# Patient Record
Sex: Female | Born: 1940 | Race: White | Hispanic: No | Marital: Single | Smoking: Never smoker
Health system: Southern US, Community
[De-identification: ages and names within clinical notes are randomized; demographics above are authoritative.]

## PROBLEM LIST (undated history)

## (undated) DIAGNOSIS — M26609 Unspecified temporomandibular joint disorder, unspecified side: Secondary | ICD-10-CM

## (undated) DIAGNOSIS — F32A Depression, unspecified: Secondary | ICD-10-CM

## (undated) DIAGNOSIS — Z853 Personal history of malignant neoplasm of breast: Secondary | ICD-10-CM

## (undated) DIAGNOSIS — F329 Major depressive disorder, single episode, unspecified: Secondary | ICD-10-CM

## (undated) DIAGNOSIS — C50919 Malignant neoplasm of unspecified site of unspecified female breast: Secondary | ICD-10-CM

## (undated) DIAGNOSIS — F419 Anxiety disorder, unspecified: Secondary | ICD-10-CM

## (undated) DIAGNOSIS — I10 Essential (primary) hypertension: Secondary | ICD-10-CM

## (undated) DIAGNOSIS — E785 Hyperlipidemia, unspecified: Secondary | ICD-10-CM

## (undated) DIAGNOSIS — K449 Diaphragmatic hernia without obstruction or gangrene: Secondary | ICD-10-CM

## (undated) DIAGNOSIS — K589 Irritable bowel syndrome without diarrhea: Secondary | ICD-10-CM

## (undated) DIAGNOSIS — E041 Nontoxic single thyroid nodule: Secondary | ICD-10-CM

## (undated) DIAGNOSIS — K219 Gastro-esophageal reflux disease without esophagitis: Secondary | ICD-10-CM

## (undated) DIAGNOSIS — M1990 Unspecified osteoarthritis, unspecified site: Secondary | ICD-10-CM

## (undated) DIAGNOSIS — Z9289 Personal history of other medical treatment: Secondary | ICD-10-CM

## (undated) HISTORY — DX: Diaphragmatic hernia without obstruction or gangrene: K44.9

## (undated) HISTORY — PX: EYE SURGERY: SHX253

## (undated) HISTORY — DX: Essential (primary) hypertension: I10

## (undated) HISTORY — DX: Depression, unspecified: F32.A

## (undated) HISTORY — PX: HERNIA REPAIR: SHX51

## (undated) HISTORY — DX: Personal history of malignant neoplasm of breast: Z85.3

## (undated) HISTORY — DX: Gastro-esophageal reflux disease without esophagitis: K21.9

## (undated) HISTORY — PX: APPENDECTOMY: SHX54

## (undated) HISTORY — DX: Unspecified temporomandibular joint disorder, unspecified side: M26.609

## (undated) HISTORY — PX: BREAST LUMPECTOMY: SHX2

## (undated) HISTORY — DX: Unspecified osteoarthritis, unspecified site: M19.90

## (undated) HISTORY — PX: THYROID SURGERY: SHX805

## (undated) HISTORY — DX: Nontoxic single thyroid nodule: E04.1

## (undated) HISTORY — DX: Hyperlipidemia, unspecified: E78.5

## (undated) HISTORY — DX: Malignant neoplasm of unspecified site of unspecified female breast: C50.919

## (undated) HISTORY — DX: Major depressive disorder, single episode, unspecified: F32.9

## (undated) HISTORY — DX: Irritable bowel syndrome, unspecified: K58.9

## (undated) HISTORY — PX: ABDOMINAL HYSTERECTOMY: SHX81

---

## 2005-11-12 ENCOUNTER — Ambulatory Visit: Payer: Self-pay | Admitting: Family Medicine

## 2005-11-19 ENCOUNTER — Ambulatory Visit: Payer: Self-pay | Admitting: Oncology

## 2005-11-26 ENCOUNTER — Encounter: Admission: RE | Admit: 2005-11-26 | Discharge: 2005-11-26 | Payer: Self-pay | Admitting: Family Medicine

## 2005-12-24 LAB — COMPREHENSIVE METABOLIC PANEL
ALT: 8 U/L (ref 0–40)
CO2: 28 mEq/L (ref 19–32)
Chloride: 98 mEq/L (ref 96–112)
Sodium: 137 mEq/L (ref 135–145)
Total Bilirubin: 1.1 mg/dL (ref 0.3–1.2)
Total Protein: 6.6 g/dL (ref 6.0–8.3)

## 2005-12-24 LAB — URINALYSIS, MICROSCOPIC - CHCC
Bilirubin (Urine): NEGATIVE
Ketones: NEGATIVE mg/dL

## 2005-12-24 LAB — CBC WITH DIFFERENTIAL/PLATELET
BASO%: 0.3 % (ref 0.0–2.0)
LYMPH%: 11.8 % — ABNORMAL LOW (ref 14.0–48.0)
MCHC: 34.1 g/dL (ref 32.0–36.0)
MONO#: 0.9 10*3/uL (ref 0.1–0.9)
RBC: 4.69 10*6/uL (ref 3.70–5.32)
WBC: 9.4 10*3/uL (ref 3.9–10.0)
lymph#: 1.1 10*3/uL (ref 0.9–3.3)

## 2006-01-02 ENCOUNTER — Ambulatory Visit: Payer: Self-pay | Admitting: Family Medicine

## 2006-03-05 ENCOUNTER — Encounter: Admission: RE | Admit: 2006-03-05 | Discharge: 2006-03-05 | Payer: Self-pay | Admitting: Oncology

## 2006-03-14 ENCOUNTER — Ambulatory Visit: Payer: Self-pay | Admitting: Oncology

## 2006-03-18 LAB — CBC WITH DIFFERENTIAL/PLATELET
Basophils Absolute: 0 10*3/uL (ref 0.0–0.1)
EOS%: 4.5 % (ref 0.0–7.0)
HCT: 41.2 % (ref 34.8–46.6)
HGB: 14.3 g/dL (ref 11.6–15.9)
MCH: 30.5 pg (ref 26.0–34.0)
MCV: 88.1 fL (ref 81.0–101.0)
MONO%: 8.2 % (ref 0.0–13.0)
NEUT%: 57.4 % (ref 39.6–76.8)

## 2006-03-18 LAB — COMPREHENSIVE METABOLIC PANEL
Albumin: 4.4 g/dL (ref 3.5–5.2)
Alkaline Phosphatase: 66 U/L (ref 39–117)
BUN: 15 mg/dL (ref 6–23)
Calcium: 10 mg/dL (ref 8.4–10.5)
Chloride: 104 mEq/L (ref 96–112)
Glucose, Bld: 127 mg/dL — ABNORMAL HIGH (ref 70–99)
Potassium: 4 mEq/L (ref 3.5–5.3)

## 2006-04-15 ENCOUNTER — Ambulatory Visit: Payer: Self-pay | Admitting: Family Medicine

## 2006-04-18 ENCOUNTER — Ambulatory Visit: Payer: Self-pay | Admitting: Family Medicine

## 2006-04-18 LAB — CONVERTED CEMR LAB
BUN: 14 mg/dL (ref 6–23)
CO2: 27 meq/L (ref 19–32)
Calcium: 9.8 mg/dL (ref 8.4–10.5)
Chol/HDL Ratio, serum: 5.7
Creatinine, Ser: 0.9 mg/dL (ref 0.4–1.2)
Eosinophil percent: 6.7 % — ABNORMAL HIGH (ref 0.0–5.0)
Glomerular Filtration Rate, Af Am: 81 mL/min/{1.73_m2}
Glucose, Bld: 107 mg/dL — ABNORMAL HIGH (ref 70–99)
HCT: 44.1 % (ref 36.0–46.0)
Hemoglobin: 14.8 g/dL (ref 12.0–15.0)
LDL Cholesterol: 172 mg/dL — ABNORMAL HIGH (ref 0–99)
Lymphocytes Relative: 31.1 % (ref 12.0–46.0)
MCV: 90.9 fL (ref 78.0–100.0)
Neutro Abs: 3.1 10*3/uL (ref 1.4–7.7)
Sodium: 141 meq/L (ref 135–145)
Total Bilirubin: 1 mg/dL (ref 0.3–1.2)
Total Protein: 6.7 g/dL (ref 6.0–8.3)
WBC: 5.9 10*3/uL (ref 4.5–10.5)

## 2006-08-02 ENCOUNTER — Ambulatory Visit: Payer: Self-pay | Admitting: Family Medicine

## 2006-09-09 ENCOUNTER — Ambulatory Visit: Payer: Self-pay | Admitting: Family Medicine

## 2006-09-18 DIAGNOSIS — E041 Nontoxic single thyroid nodule: Secondary | ICD-10-CM | POA: Insufficient documentation

## 2006-09-18 DIAGNOSIS — K589 Irritable bowel syndrome without diarrhea: Secondary | ICD-10-CM | POA: Insufficient documentation

## 2006-09-18 DIAGNOSIS — I10 Essential (primary) hypertension: Secondary | ICD-10-CM | POA: Insufficient documentation

## 2006-09-18 DIAGNOSIS — M129 Arthropathy, unspecified: Secondary | ICD-10-CM | POA: Insufficient documentation

## 2006-09-19 ENCOUNTER — Encounter: Payer: Self-pay | Admitting: Family Medicine

## 2006-09-19 ENCOUNTER — Ambulatory Visit: Payer: Self-pay | Admitting: Family Medicine

## 2006-09-19 DIAGNOSIS — M26629 Arthralgia of temporomandibular joint, unspecified side: Secondary | ICD-10-CM | POA: Insufficient documentation

## 2006-09-19 DIAGNOSIS — J019 Acute sinusitis, unspecified: Secondary | ICD-10-CM | POA: Insufficient documentation

## 2006-09-19 DIAGNOSIS — F320 Major depressive disorder, single episode, mild: Secondary | ICD-10-CM | POA: Insufficient documentation

## 2006-09-19 DIAGNOSIS — M159 Polyosteoarthritis, unspecified: Secondary | ICD-10-CM | POA: Insufficient documentation

## 2006-09-19 DIAGNOSIS — Z853 Personal history of malignant neoplasm of breast: Secondary | ICD-10-CM | POA: Insufficient documentation

## 2006-10-01 ENCOUNTER — Ambulatory Visit: Payer: Self-pay | Admitting: Oncology

## 2006-10-03 LAB — CANCER ANTIGEN 27.29: CA 27.29: 19 U/mL (ref 0–39)

## 2006-10-03 LAB — COMPREHENSIVE METABOLIC PANEL
ALT: 11 U/L (ref 0–35)
AST: 15 U/L (ref 0–37)
Alkaline Phosphatase: 69 U/L (ref 39–117)
Creatinine, Ser: 0.71 mg/dL (ref 0.40–1.20)
Sodium: 140 mEq/L (ref 135–145)
Total Bilirubin: 0.7 mg/dL (ref 0.3–1.2)
Total Protein: 6.9 g/dL (ref 6.0–8.3)

## 2006-10-03 LAB — CBC WITH DIFFERENTIAL/PLATELET
BASO%: 1.1 % (ref 0.0–2.0)
EOS%: 2.8 % (ref 0.0–7.0)
HCT: 41.7 % (ref 34.8–46.6)
LYMPH%: 23 % (ref 14.0–48.0)
MCH: 31.3 pg (ref 26.0–34.0)
MCHC: 35.3 g/dL (ref 32.0–36.0)
MONO#: 0.4 10*3/uL (ref 0.1–0.9)
MONO%: 7 % (ref 0.0–13.0)
NEUT%: 66.1 % (ref 39.6–76.8)
Platelets: 329 10*3/uL (ref 145–400)
RBC: 4.7 10*6/uL (ref 3.70–5.32)
WBC: 6.2 10*3/uL (ref 3.9–10.0)

## 2006-10-10 ENCOUNTER — Encounter: Payer: Self-pay | Admitting: Family Medicine

## 2006-10-31 ENCOUNTER — Telehealth (INDEPENDENT_AMBULATORY_CARE_PROVIDER_SITE_OTHER): Payer: Self-pay | Admitting: *Deleted

## 2006-11-19 ENCOUNTER — Ambulatory Visit: Payer: Self-pay | Admitting: Family Medicine

## 2006-11-19 DIAGNOSIS — N390 Urinary tract infection, site not specified: Secondary | ICD-10-CM | POA: Insufficient documentation

## 2006-11-19 LAB — CONVERTED CEMR LAB
Glucose, Urine, Semiquant: NEGATIVE
Nitrite: NEGATIVE
Urobilinogen, UA: NEGATIVE
pH: 6

## 2006-11-21 ENCOUNTER — Encounter: Payer: Self-pay | Admitting: Family Medicine

## 2006-11-21 ENCOUNTER — Telehealth (INDEPENDENT_AMBULATORY_CARE_PROVIDER_SITE_OTHER): Payer: Self-pay | Admitting: *Deleted

## 2006-11-29 ENCOUNTER — Encounter: Admission: RE | Admit: 2006-11-29 | Discharge: 2006-11-29 | Payer: Self-pay | Admitting: Oncology

## 2006-12-13 ENCOUNTER — Telehealth: Payer: Self-pay | Admitting: Family Medicine

## 2006-12-24 ENCOUNTER — Encounter: Admission: RE | Admit: 2006-12-24 | Discharge: 2006-12-24 | Payer: Self-pay | Admitting: Oncology

## 2006-12-31 ENCOUNTER — Ambulatory Visit: Payer: Self-pay | Admitting: Family Medicine

## 2006-12-31 DIAGNOSIS — M26609 Unspecified temporomandibular joint disorder, unspecified side: Secondary | ICD-10-CM | POA: Insufficient documentation

## 2006-12-31 DIAGNOSIS — R300 Dysuria: Secondary | ICD-10-CM | POA: Insufficient documentation

## 2006-12-31 LAB — CONVERTED CEMR LAB
Bilirubin Urine: NEGATIVE
Glucose, Urine, Semiquant: NEGATIVE
Urobilinogen, UA: NEGATIVE

## 2007-01-01 ENCOUNTER — Encounter: Payer: Self-pay | Admitting: Family Medicine

## 2007-01-16 ENCOUNTER — Telehealth (INDEPENDENT_AMBULATORY_CARE_PROVIDER_SITE_OTHER): Payer: Self-pay | Admitting: *Deleted

## 2007-01-21 ENCOUNTER — Ambulatory Visit: Payer: Self-pay | Admitting: Family Medicine

## 2007-01-23 ENCOUNTER — Telehealth: Payer: Self-pay | Admitting: Family Medicine

## 2007-01-23 ENCOUNTER — Encounter: Payer: Self-pay | Admitting: Family Medicine

## 2007-01-27 ENCOUNTER — Telehealth (INDEPENDENT_AMBULATORY_CARE_PROVIDER_SITE_OTHER): Payer: Self-pay | Admitting: *Deleted

## 2007-02-03 ENCOUNTER — Telehealth (INDEPENDENT_AMBULATORY_CARE_PROVIDER_SITE_OTHER): Payer: Self-pay | Admitting: *Deleted

## 2007-02-06 ENCOUNTER — Ambulatory Visit: Payer: Self-pay | Admitting: Family Medicine

## 2007-02-18 ENCOUNTER — Encounter (INDEPENDENT_AMBULATORY_CARE_PROVIDER_SITE_OTHER): Payer: Self-pay | Admitting: *Deleted

## 2007-02-20 ENCOUNTER — Ambulatory Visit: Payer: Self-pay | Admitting: Family Medicine

## 2007-02-20 DIAGNOSIS — J309 Allergic rhinitis, unspecified: Secondary | ICD-10-CM | POA: Insufficient documentation

## 2007-03-05 ENCOUNTER — Telehealth (INDEPENDENT_AMBULATORY_CARE_PROVIDER_SITE_OTHER): Payer: Self-pay | Admitting: *Deleted

## 2007-03-21 ENCOUNTER — Emergency Department (HOSPITAL_COMMUNITY): Admission: EM | Admit: 2007-03-21 | Discharge: 2007-03-22 | Payer: Self-pay | Admitting: Emergency Medicine

## 2007-03-27 ENCOUNTER — Ambulatory Visit (HOSPITAL_COMMUNITY): Admission: RE | Admit: 2007-03-27 | Discharge: 2007-03-27 | Payer: Self-pay | Admitting: Oncology

## 2007-03-27 ENCOUNTER — Ambulatory Visit: Payer: Self-pay | Admitting: Oncology

## 2007-03-27 LAB — BASIC METABOLIC PANEL
BUN: 20 mg/dL (ref 6–23)
CO2: 27 mEq/L (ref 19–32)
Calcium: 9.8 mg/dL (ref 8.4–10.5)
Chloride: 107 mEq/L (ref 96–112)
Creatinine, Ser: 0.75 mg/dL (ref 0.40–1.20)
Glucose, Bld: 123 mg/dL — ABNORMAL HIGH (ref 70–99)

## 2007-03-31 ENCOUNTER — Ambulatory Visit (HOSPITAL_COMMUNITY): Admission: RE | Admit: 2007-03-31 | Discharge: 2007-03-31 | Payer: Self-pay | Admitting: Obstetrics and Gynecology

## 2007-03-31 ENCOUNTER — Encounter (INDEPENDENT_AMBULATORY_CARE_PROVIDER_SITE_OTHER): Payer: Self-pay | Admitting: Obstetrics and Gynecology

## 2007-04-10 ENCOUNTER — Encounter: Payer: Self-pay | Admitting: Family Medicine

## 2007-04-17 ENCOUNTER — Telehealth (INDEPENDENT_AMBULATORY_CARE_PROVIDER_SITE_OTHER): Payer: Self-pay | Admitting: *Deleted

## 2007-04-22 ENCOUNTER — Inpatient Hospital Stay (HOSPITAL_COMMUNITY): Admission: RE | Admit: 2007-04-22 | Discharge: 2007-04-23 | Payer: Self-pay | Admitting: Obstetrics and Gynecology

## 2007-04-22 ENCOUNTER — Encounter (INDEPENDENT_AMBULATORY_CARE_PROVIDER_SITE_OTHER): Payer: Self-pay | Admitting: Obstetrics and Gynecology

## 2007-05-12 ENCOUNTER — Ambulatory Visit: Payer: Self-pay | Admitting: Oncology

## 2007-05-14 ENCOUNTER — Encounter: Payer: Self-pay | Admitting: Family Medicine

## 2007-05-14 LAB — CBC WITH DIFFERENTIAL/PLATELET
Basophils Absolute: 0 10*3/uL (ref 0.0–0.1)
Eosinophils Absolute: 0.2 10*3/uL (ref 0.0–0.5)
HGB: 14.6 g/dL (ref 11.6–15.9)
MCV: 88.3 fL (ref 81.0–101.0)
MONO%: 5.5 % (ref 0.0–13.0)
NEUT#: 3.5 10*3/uL (ref 1.5–6.5)
RDW: 12.9 % (ref 11.3–14.5)

## 2007-05-15 LAB — TSH: TSH: 1.464 u[IU]/mL (ref 0.350–5.500)

## 2007-05-15 LAB — COMPREHENSIVE METABOLIC PANEL
Albumin: 4.5 g/dL (ref 3.5–5.2)
Alkaline Phosphatase: 71 U/L (ref 39–117)
BUN: 17 mg/dL (ref 6–23)
CO2: 24 mEq/L (ref 19–32)
Calcium: 9.9 mg/dL (ref 8.4–10.5)
Chloride: 105 mEq/L (ref 96–112)
Glucose, Bld: 139 mg/dL — ABNORMAL HIGH (ref 70–99)
Potassium: 4.2 mEq/L (ref 3.5–5.3)

## 2007-05-15 LAB — CANCER ANTIGEN 27.29: CA 27.29: 21 U/mL (ref 0–39)

## 2007-05-15 LAB — CA 125: CA 125: 22.2 U/mL (ref 0.0–30.2)

## 2007-06-09 ENCOUNTER — Encounter: Admission: RE | Admit: 2007-06-09 | Discharge: 2007-06-09 | Payer: Self-pay | Admitting: Obstetrics and Gynecology

## 2007-06-10 ENCOUNTER — Ambulatory Visit: Payer: Self-pay | Admitting: Internal Medicine

## 2007-06-10 DIAGNOSIS — M109 Gout, unspecified: Secondary | ICD-10-CM | POA: Insufficient documentation

## 2007-06-10 LAB — CONVERTED CEMR LAB
Glucose, Urine, Semiquant: NEGATIVE
Ketones, urine, test strip: NEGATIVE
Nitrite: POSITIVE
Specific Gravity, Urine: >=1.03

## 2007-06-11 ENCOUNTER — Encounter: Payer: Self-pay | Admitting: Internal Medicine

## 2007-06-12 ENCOUNTER — Telehealth (INDEPENDENT_AMBULATORY_CARE_PROVIDER_SITE_OTHER): Payer: Self-pay | Admitting: *Deleted

## 2007-06-26 ENCOUNTER — Ambulatory Visit: Payer: Self-pay | Admitting: Family Medicine

## 2007-06-26 DIAGNOSIS — L820 Inflamed seborrheic keratosis: Secondary | ICD-10-CM | POA: Insufficient documentation

## 2007-06-26 DIAGNOSIS — IMO0002 Reserved for concepts with insufficient information to code with codable children: Secondary | ICD-10-CM | POA: Insufficient documentation

## 2007-07-07 ENCOUNTER — Telehealth (INDEPENDENT_AMBULATORY_CARE_PROVIDER_SITE_OTHER): Payer: Self-pay | Admitting: *Deleted

## 2007-07-16 ENCOUNTER — Telehealth (INDEPENDENT_AMBULATORY_CARE_PROVIDER_SITE_OTHER): Payer: Self-pay | Admitting: *Deleted

## 2007-08-07 ENCOUNTER — Telehealth (INDEPENDENT_AMBULATORY_CARE_PROVIDER_SITE_OTHER): Payer: Self-pay | Admitting: *Deleted

## 2007-08-13 ENCOUNTER — Ambulatory Visit: Payer: Self-pay | Admitting: Internal Medicine

## 2007-08-13 DIAGNOSIS — R350 Frequency of micturition: Secondary | ICD-10-CM | POA: Insufficient documentation

## 2007-08-13 LAB — CONVERTED CEMR LAB
Bilirubin Urine: NEGATIVE
Glucose, Urine, Semiquant: NEGATIVE
Urobilinogen, UA: NEGATIVE

## 2007-08-15 ENCOUNTER — Encounter: Payer: Self-pay | Admitting: Internal Medicine

## 2007-09-02 ENCOUNTER — Telehealth (INDEPENDENT_AMBULATORY_CARE_PROVIDER_SITE_OTHER): Payer: Self-pay | Admitting: *Deleted

## 2007-09-30 ENCOUNTER — Ambulatory Visit: Payer: Self-pay | Admitting: Oncology

## 2007-10-09 ENCOUNTER — Ambulatory Visit (HOSPITAL_COMMUNITY): Admission: RE | Admit: 2007-10-09 | Discharge: 2007-10-09 | Payer: Self-pay | Admitting: Oncology

## 2007-10-09 LAB — CBC WITH DIFFERENTIAL/PLATELET
BASO%: 0.6 % (ref 0.0–2.0)
LYMPH%: 24.2 % (ref 14.0–48.0)
MCHC: 34.4 g/dL (ref 32.0–36.0)
MONO#: 0.5 10*3/uL (ref 0.1–0.9)
MONO%: 8.1 % (ref 0.0–13.0)
Platelets: 314 10*3/uL (ref 145–400)
RBC: 4.83 10*6/uL (ref 3.70–5.32)
WBC: 6.7 10*3/uL (ref 3.9–10.0)

## 2007-10-09 LAB — COMPREHENSIVE METABOLIC PANEL
ALT: 16 U/L (ref 0–35)
AST: 20 U/L (ref 0–37)
Alkaline Phosphatase: 61 U/L (ref 39–117)
CO2: 28 mEq/L (ref 19–32)
Sodium: 138 mEq/L (ref 135–145)
Total Bilirubin: 0.9 mg/dL (ref 0.3–1.2)
Total Protein: 6.5 g/dL (ref 6.0–8.3)

## 2007-10-14 ENCOUNTER — Telehealth: Payer: Self-pay | Admitting: Family Medicine

## 2007-10-15 ENCOUNTER — Telehealth (INDEPENDENT_AMBULATORY_CARE_PROVIDER_SITE_OTHER): Payer: Self-pay | Admitting: *Deleted

## 2007-10-21 ENCOUNTER — Telehealth (INDEPENDENT_AMBULATORY_CARE_PROVIDER_SITE_OTHER): Payer: Self-pay | Admitting: *Deleted

## 2007-11-03 ENCOUNTER — Encounter: Payer: Self-pay | Admitting: Family Medicine

## 2007-11-06 ENCOUNTER — Telehealth (INDEPENDENT_AMBULATORY_CARE_PROVIDER_SITE_OTHER): Payer: Self-pay | Admitting: *Deleted

## 2007-11-10 ENCOUNTER — Ambulatory Visit: Payer: Self-pay | Admitting: Internal Medicine

## 2007-11-10 LAB — CONVERTED CEMR LAB
Ketones, urine, test strip: NEGATIVE
Nitrite: POSITIVE
Specific Gravity, Urine: 1.015
pH: 7

## 2007-11-11 ENCOUNTER — Encounter: Payer: Self-pay | Admitting: Internal Medicine

## 2007-11-14 ENCOUNTER — Telehealth (INDEPENDENT_AMBULATORY_CARE_PROVIDER_SITE_OTHER): Payer: Self-pay | Admitting: *Deleted

## 2007-11-19 ENCOUNTER — Telehealth (INDEPENDENT_AMBULATORY_CARE_PROVIDER_SITE_OTHER): Payer: Self-pay | Admitting: *Deleted

## 2007-11-28 ENCOUNTER — Ambulatory Visit: Payer: Self-pay | Admitting: Family Medicine

## 2007-11-28 DIAGNOSIS — M549 Dorsalgia, unspecified: Secondary | ICD-10-CM | POA: Insufficient documentation

## 2007-11-28 DIAGNOSIS — R5381 Other malaise: Secondary | ICD-10-CM | POA: Insufficient documentation

## 2007-11-28 DIAGNOSIS — R5383 Other fatigue: Secondary | ICD-10-CM

## 2007-11-28 LAB — CONVERTED CEMR LAB
Blood in Urine, dipstick: NEGATIVE
Glucose, Urine, Semiquant: NEGATIVE
Ketones, urine, test strip: NEGATIVE
Nitrite: NEGATIVE

## 2007-12-01 ENCOUNTER — Ambulatory Visit: Payer: Self-pay | Admitting: Family Medicine

## 2007-12-01 ENCOUNTER — Encounter: Admission: RE | Admit: 2007-12-01 | Discharge: 2007-12-01 | Payer: Self-pay | Admitting: Oncology

## 2007-12-01 ENCOUNTER — Encounter (INDEPENDENT_AMBULATORY_CARE_PROVIDER_SITE_OTHER): Payer: Self-pay | Admitting: *Deleted

## 2007-12-02 ENCOUNTER — Encounter (INDEPENDENT_AMBULATORY_CARE_PROVIDER_SITE_OTHER): Payer: Self-pay | Admitting: *Deleted

## 2007-12-15 ENCOUNTER — Encounter: Admission: RE | Admit: 2007-12-15 | Discharge: 2007-12-30 | Payer: Self-pay | Admitting: Family Medicine

## 2007-12-16 ENCOUNTER — Ambulatory Visit: Payer: Self-pay | Admitting: Family Medicine

## 2007-12-16 LAB — CONVERTED CEMR LAB
Bilirubin Urine: NEGATIVE
Ketones, urine, test strip: NEGATIVE
Nitrite: POSITIVE
Protein, U semiquant: 30
Specific Gravity, Urine: 1.025
Urobilinogen, UA: 0.2

## 2007-12-17 ENCOUNTER — Encounter: Payer: Self-pay | Admitting: Family Medicine

## 2007-12-22 ENCOUNTER — Ambulatory Visit: Payer: Self-pay | Admitting: Family Medicine

## 2007-12-22 ENCOUNTER — Telehealth (INDEPENDENT_AMBULATORY_CARE_PROVIDER_SITE_OTHER): Payer: Self-pay | Admitting: *Deleted

## 2007-12-22 DIAGNOSIS — F411 Generalized anxiety disorder: Secondary | ICD-10-CM | POA: Insufficient documentation

## 2007-12-22 DIAGNOSIS — K449 Diaphragmatic hernia without obstruction or gangrene: Secondary | ICD-10-CM | POA: Insufficient documentation

## 2007-12-22 LAB — CONVERTED CEMR LAB
Nitrite: NEGATIVE
Urobilinogen, UA: NEGATIVE
WBC Urine, dipstick: NEGATIVE
pH: 6.5

## 2007-12-24 ENCOUNTER — Encounter: Payer: Self-pay | Admitting: Family Medicine

## 2007-12-24 ENCOUNTER — Ambulatory Visit: Payer: Self-pay

## 2007-12-25 ENCOUNTER — Encounter (INDEPENDENT_AMBULATORY_CARE_PROVIDER_SITE_OTHER): Payer: Self-pay | Admitting: *Deleted

## 2007-12-25 ENCOUNTER — Ambulatory Visit: Payer: Self-pay | Admitting: Gastroenterology

## 2007-12-25 ENCOUNTER — Telehealth (INDEPENDENT_AMBULATORY_CARE_PROVIDER_SITE_OTHER): Payer: Self-pay | Admitting: *Deleted

## 2007-12-25 DIAGNOSIS — R1310 Dysphagia, unspecified: Secondary | ICD-10-CM | POA: Insufficient documentation

## 2007-12-25 DIAGNOSIS — K3189 Other diseases of stomach and duodenum: Secondary | ICD-10-CM | POA: Insufficient documentation

## 2007-12-25 DIAGNOSIS — R1013 Epigastric pain: Secondary | ICD-10-CM

## 2007-12-25 LAB — CONVERTED CEMR LAB
BUN: 19 mg/dL (ref 6–23)
CO2: 32 meq/L (ref 19–32)
Chloride: 101 meq/L (ref 96–112)
Free T4: 0.8 ng/dL (ref 0.6–1.6)
GFR calc non Af Amer: 53 mL/min
Lymphocytes Relative: 27.7 % (ref 12.0–46.0)
MCV: 91.5 fL (ref 78.0–100.0)
Monocytes Absolute: 0.3 10*3/uL (ref 0.1–1.0)
Neutrophils Relative %: 64.6 % (ref 43.0–77.0)
RBC: 4.73 M/uL (ref 3.87–5.11)
RDW: 12.2 % (ref 11.5–14.6)
Vit D, 1,25-Dihydroxy: 11 — ABNORMAL LOW (ref 30–89)
WBC: 8.3 10*3/uL (ref 4.5–10.5)

## 2007-12-30 ENCOUNTER — Ambulatory Visit: Payer: Self-pay | Admitting: Family Medicine

## 2007-12-30 DIAGNOSIS — E559 Vitamin D deficiency, unspecified: Secondary | ICD-10-CM | POA: Insufficient documentation

## 2007-12-31 ENCOUNTER — Encounter: Payer: Self-pay | Admitting: Family Medicine

## 2008-01-14 ENCOUNTER — Ambulatory Visit: Payer: Self-pay | Admitting: Gastroenterology

## 2008-01-14 ENCOUNTER — Encounter: Payer: Self-pay | Admitting: Gastroenterology

## 2008-01-15 ENCOUNTER — Telehealth: Payer: Self-pay | Admitting: Gastroenterology

## 2008-01-19 ENCOUNTER — Telehealth (INDEPENDENT_AMBULATORY_CARE_PROVIDER_SITE_OTHER): Payer: Self-pay | Admitting: *Deleted

## 2008-01-20 ENCOUNTER — Telehealth (INDEPENDENT_AMBULATORY_CARE_PROVIDER_SITE_OTHER): Payer: Self-pay | Admitting: *Deleted

## 2008-01-21 ENCOUNTER — Encounter: Payer: Self-pay | Admitting: Gastroenterology

## 2008-01-29 ENCOUNTER — Ambulatory Visit: Payer: Self-pay | Admitting: Family Medicine

## 2008-02-02 ENCOUNTER — Encounter (INDEPENDENT_AMBULATORY_CARE_PROVIDER_SITE_OTHER): Payer: Self-pay | Admitting: *Deleted

## 2008-02-17 ENCOUNTER — Telehealth (INDEPENDENT_AMBULATORY_CARE_PROVIDER_SITE_OTHER): Payer: Self-pay | Admitting: *Deleted

## 2008-02-27 ENCOUNTER — Ambulatory Visit: Payer: Self-pay | Admitting: Gastroenterology

## 2008-03-17 ENCOUNTER — Telehealth (INDEPENDENT_AMBULATORY_CARE_PROVIDER_SITE_OTHER): Payer: Self-pay | Admitting: *Deleted

## 2008-03-30 ENCOUNTER — Encounter: Payer: Self-pay | Admitting: Family Medicine

## 2008-04-16 ENCOUNTER — Telehealth (INDEPENDENT_AMBULATORY_CARE_PROVIDER_SITE_OTHER): Payer: Self-pay | Admitting: *Deleted

## 2008-05-19 ENCOUNTER — Telehealth (INDEPENDENT_AMBULATORY_CARE_PROVIDER_SITE_OTHER): Payer: Self-pay | Admitting: *Deleted

## 2008-05-27 ENCOUNTER — Ambulatory Visit: Payer: Self-pay | Admitting: Family Medicine

## 2008-05-27 DIAGNOSIS — J069 Acute upper respiratory infection, unspecified: Secondary | ICD-10-CM | POA: Insufficient documentation

## 2008-05-28 ENCOUNTER — Telehealth (INDEPENDENT_AMBULATORY_CARE_PROVIDER_SITE_OTHER): Payer: Self-pay | Admitting: *Deleted

## 2008-06-01 ENCOUNTER — Ambulatory Visit: Payer: Self-pay | Admitting: Oncology

## 2008-06-07 ENCOUNTER — Ambulatory Visit: Payer: Self-pay | Admitting: Family Medicine

## 2008-06-09 LAB — COMPREHENSIVE METABOLIC PANEL
ALT: 13 U/L (ref 0–35)
CO2: 29 mEq/L (ref 19–32)
Chloride: 104 mEq/L (ref 96–112)
Potassium: 4 mEq/L (ref 3.5–5.3)
Sodium: 141 mEq/L (ref 135–145)
Total Bilirubin: 0.3 mg/dL (ref 0.3–1.2)
Total Protein: 6.4 g/dL (ref 6.0–8.3)

## 2008-06-09 LAB — CBC WITH DIFFERENTIAL/PLATELET
Eosinophils Absolute: 0.1 10*3/uL (ref 0.0–0.5)
MONO#: 0.5 10*3/uL (ref 0.1–0.9)
MONO%: 5.8 % (ref 0.0–13.0)
NEUT#: 6.1 10*3/uL (ref 1.5–6.5)
RBC: 4.31 10*6/uL (ref 3.70–5.32)
RDW: 12.9 % (ref 11.3–14.5)
WBC: 8.4 10*3/uL (ref 3.9–10.0)
lymph#: 1.7 10*3/uL (ref 0.9–3.3)

## 2008-06-14 ENCOUNTER — Encounter: Admission: RE | Admit: 2008-06-14 | Discharge: 2008-06-14 | Payer: Self-pay | Admitting: Oncology

## 2008-06-17 ENCOUNTER — Telehealth: Payer: Self-pay | Admitting: Family Medicine

## 2008-06-18 ENCOUNTER — Ambulatory Visit: Payer: Self-pay | Admitting: Family Medicine

## 2008-06-24 ENCOUNTER — Telehealth (INDEPENDENT_AMBULATORY_CARE_PROVIDER_SITE_OTHER): Payer: Self-pay | Admitting: *Deleted

## 2008-06-28 ENCOUNTER — Ambulatory Visit: Payer: Self-pay | Admitting: Family Medicine

## 2008-06-28 DIAGNOSIS — M546 Pain in thoracic spine: Secondary | ICD-10-CM | POA: Insufficient documentation

## 2008-07-03 LAB — CONVERTED CEMR LAB
ALT: 10 units/L (ref 0–35)
Basophils Relative: 1 % (ref 0.0–3.0)
Chloride: 103 meq/L (ref 96–112)
Creatinine, Ser: 0.8 mg/dL (ref 0.4–1.2)
GFR calc Af Amer: 92 mL/min
Glucose, Bld: 97 mg/dL (ref 70–99)
HCT: 40.8 % (ref 36.0–46.0)
HDL: 37.6 mg/dL — ABNORMAL LOW (ref >39.0)
Lymphocytes Relative: 24.7 % (ref 12.0–46.0)
MCHC: 34.2 g/dL (ref 30.0–36.0)
MCV: 90.4 fL (ref 78.0–100.0)
Monocytes Absolute: 0.5 10*3/uL (ref 0.1–1.0)
Monocytes Relative: 6.3 % (ref 3.0–12.0)
Neutro Abs: 4.5 10*3/uL (ref 1.4–7.7)
Neutrophils Relative %: 64.2 % (ref 43.0–77.0)
Platelets: 267 10*3/uL (ref 150–400)
RBC: 4.51 M/uL (ref 3.87–5.11)
RDW: 12.3 % (ref 11.5–14.6)
Total CHOL/HDL Ratio: 4.6
Total Protein: 6.5 g/dL (ref 6.0–8.3)
Triglycerides: 122 mg/dL (ref 0–149)
WBC: 7.2 10*3/uL (ref 4.5–10.5)

## 2008-07-05 ENCOUNTER — Encounter (INDEPENDENT_AMBULATORY_CARE_PROVIDER_SITE_OTHER): Payer: Self-pay | Admitting: *Deleted

## 2008-07-06 ENCOUNTER — Encounter (INDEPENDENT_AMBULATORY_CARE_PROVIDER_SITE_OTHER): Payer: Self-pay | Admitting: *Deleted

## 2008-07-15 ENCOUNTER — Telehealth: Payer: Self-pay | Admitting: Family Medicine

## 2008-07-21 ENCOUNTER — Encounter: Payer: Self-pay | Admitting: Family Medicine

## 2008-08-16 ENCOUNTER — Telehealth: Payer: Self-pay | Admitting: Family Medicine

## 2008-09-08 ENCOUNTER — Telehealth (INDEPENDENT_AMBULATORY_CARE_PROVIDER_SITE_OTHER): Payer: Self-pay | Admitting: *Deleted

## 2008-09-15 ENCOUNTER — Telehealth (INDEPENDENT_AMBULATORY_CARE_PROVIDER_SITE_OTHER): Payer: Self-pay | Admitting: *Deleted

## 2008-09-29 ENCOUNTER — Ambulatory Visit: Payer: Self-pay | Admitting: Family Medicine

## 2008-09-29 DIAGNOSIS — M542 Cervicalgia: Secondary | ICD-10-CM | POA: Insufficient documentation

## 2008-09-29 DIAGNOSIS — F329 Major depressive disorder, single episode, unspecified: Secondary | ICD-10-CM | POA: Insufficient documentation

## 2008-10-01 ENCOUNTER — Telehealth (INDEPENDENT_AMBULATORY_CARE_PROVIDER_SITE_OTHER): Payer: Self-pay | Admitting: *Deleted

## 2008-10-01 ENCOUNTER — Ambulatory Visit: Payer: Self-pay | Admitting: Family Medicine

## 2008-10-05 ENCOUNTER — Ambulatory Visit: Payer: Self-pay | Admitting: Family Medicine

## 2008-10-07 ENCOUNTER — Ambulatory Visit: Payer: Self-pay | Admitting: Family Medicine

## 2008-10-14 ENCOUNTER — Ambulatory Visit: Payer: Self-pay | Admitting: Family Medicine

## 2008-10-21 ENCOUNTER — Ambulatory Visit: Payer: Self-pay | Admitting: Family Medicine

## 2008-10-25 ENCOUNTER — Telehealth (INDEPENDENT_AMBULATORY_CARE_PROVIDER_SITE_OTHER): Payer: Self-pay | Admitting: *Deleted

## 2008-11-03 ENCOUNTER — Telehealth (INDEPENDENT_AMBULATORY_CARE_PROVIDER_SITE_OTHER): Payer: Self-pay | Admitting: *Deleted

## 2008-11-23 ENCOUNTER — Telehealth (INDEPENDENT_AMBULATORY_CARE_PROVIDER_SITE_OTHER): Payer: Self-pay | Admitting: *Deleted

## 2008-12-01 ENCOUNTER — Encounter: Admission: RE | Admit: 2008-12-01 | Discharge: 2008-12-01 | Payer: Self-pay | Admitting: Oncology

## 2008-12-03 ENCOUNTER — Ambulatory Visit: Payer: Self-pay | Admitting: Family Medicine

## 2008-12-27 ENCOUNTER — Encounter: Payer: Self-pay | Admitting: Family Medicine

## 2009-01-13 ENCOUNTER — Encounter: Payer: Self-pay | Admitting: Family Medicine

## 2009-01-13 ENCOUNTER — Ambulatory Visit: Payer: Self-pay | Admitting: Family Medicine

## 2009-01-18 ENCOUNTER — Ambulatory Visit: Payer: Self-pay | Admitting: Family Medicine

## 2009-01-18 ENCOUNTER — Telehealth: Payer: Self-pay | Admitting: Family Medicine

## 2009-02-03 ENCOUNTER — Ambulatory Visit: Payer: Self-pay | Admitting: Oncology

## 2009-02-07 LAB — CBC WITH DIFFERENTIAL/PLATELET
BASO%: 0.2 % (ref 0.0–2.0)
EOS%: 2.9 % (ref 0.0–7.0)
HCT: 39.3 % (ref 34.8–46.6)
LYMPH%: 25 % (ref 14.0–49.7)
MCH: 31 pg (ref 25.1–34.0)
MCHC: 34.4 g/dL (ref 31.5–36.0)
MCV: 90.1 fL (ref 79.5–101.0)
MONO%: 7.4 % (ref 0.0–14.0)
NEUT%: 64.5 % (ref 38.4–76.8)
lymph#: 1.5 10*3/uL (ref 0.9–3.3)

## 2009-02-07 LAB — COMPREHENSIVE METABOLIC PANEL
Albumin: 3.8 g/dL (ref 3.5–5.2)
Calcium: 9.3 mg/dL (ref 8.4–10.5)
Chloride: 103 mEq/L (ref 96–112)
Total Protein: 6.2 g/dL (ref 6.0–8.3)

## 2009-02-22 ENCOUNTER — Telehealth (INDEPENDENT_AMBULATORY_CARE_PROVIDER_SITE_OTHER): Payer: Self-pay | Admitting: *Deleted

## 2009-03-09 ENCOUNTER — Ambulatory Visit: Payer: Self-pay | Admitting: Family Medicine

## 2009-03-09 DIAGNOSIS — M25569 Pain in unspecified knee: Secondary | ICD-10-CM | POA: Insufficient documentation

## 2009-03-28 ENCOUNTER — Telehealth: Payer: Self-pay | Admitting: Family Medicine

## 2009-05-03 ENCOUNTER — Telehealth: Payer: Self-pay | Admitting: Family Medicine

## 2009-05-11 ENCOUNTER — Telehealth: Payer: Self-pay | Admitting: Family Medicine

## 2009-05-19 ENCOUNTER — Ambulatory Visit: Payer: Self-pay | Admitting: Family Medicine

## 2009-05-20 ENCOUNTER — Telehealth (INDEPENDENT_AMBULATORY_CARE_PROVIDER_SITE_OTHER): Payer: Self-pay | Admitting: *Deleted

## 2009-06-06 ENCOUNTER — Ambulatory Visit: Payer: Self-pay | Admitting: Family Medicine

## 2009-07-13 ENCOUNTER — Telehealth: Payer: Self-pay | Admitting: Family Medicine

## 2009-07-20 ENCOUNTER — Ambulatory Visit: Payer: Self-pay | Admitting: Family Medicine

## 2009-07-20 LAB — CONVERTED CEMR LAB
ALT: 13 units/L (ref 0–35)
Albumin: 3.8 g/dL (ref 3.5–5.2)
Alkaline Phosphatase: 33 units/L — ABNORMAL LOW (ref 39–117)
CO2: 29 meq/L (ref 19–32)
Calcium: 9 mg/dL (ref 8.4–10.5)
Creatinine, Ser: 0.7 mg/dL (ref 0.4–1.2)
GFR calc non Af Amer: 88.19 mL/min (ref >60)
LDL Cholesterol: 113 mg/dL — ABNORMAL HIGH (ref 0–99)
Potassium: 3.8 meq/L (ref 3.5–5.1)
Sodium: 141 meq/L (ref 135–145)
Total Protein: 6.7 g/dL (ref 6.0–8.3)
Triglycerides: 150 mg/dL — ABNORMAL HIGH (ref 0.0–149.0)
VLDL: 30 mg/dL (ref 0.0–40.0)

## 2009-07-28 ENCOUNTER — Ambulatory Visit: Payer: Self-pay | Admitting: Family Medicine

## 2009-07-28 LAB — CONVERTED CEMR LAB: Vit D, 25-Hydroxy: 27 ng/mL — ABNORMAL LOW (ref 30–89)

## 2009-08-11 ENCOUNTER — Ambulatory Visit: Payer: Self-pay | Admitting: Family Medicine

## 2009-08-11 DIAGNOSIS — H538 Other visual disturbances: Secondary | ICD-10-CM | POA: Insufficient documentation

## 2009-08-11 DIAGNOSIS — G43809 Other migraine, not intractable, without status migrainosus: Secondary | ICD-10-CM | POA: Insufficient documentation

## 2009-08-15 ENCOUNTER — Encounter: Payer: Self-pay | Admitting: Family Medicine

## 2009-08-17 ENCOUNTER — Telehealth (INDEPENDENT_AMBULATORY_CARE_PROVIDER_SITE_OTHER): Payer: Self-pay | Admitting: *Deleted

## 2009-08-17 ENCOUNTER — Encounter: Admission: RE | Admit: 2009-08-17 | Discharge: 2009-08-17 | Payer: Self-pay | Admitting: Family Medicine

## 2009-09-01 ENCOUNTER — Ambulatory Visit: Payer: Self-pay | Admitting: Family Medicine

## 2009-09-01 DIAGNOSIS — T148 Other injury of unspecified body region: Secondary | ICD-10-CM | POA: Insufficient documentation

## 2009-09-01 DIAGNOSIS — W57XXXA Bitten or stung by nonvenomous insect and other nonvenomous arthropods, initial encounter: Secondary | ICD-10-CM

## 2009-09-02 ENCOUNTER — Ambulatory Visit: Payer: Self-pay | Admitting: Internal Medicine

## 2009-09-06 ENCOUNTER — Ambulatory Visit: Payer: Self-pay | Admitting: Family Medicine

## 2009-09-06 ENCOUNTER — Encounter: Payer: Self-pay | Admitting: Family Medicine

## 2009-09-06 ENCOUNTER — Ambulatory Visit
Admission: RE | Admit: 2009-09-06 | Discharge: 2009-09-06 | Payer: Self-pay | Source: Home / Self Care | Admitting: Family Medicine

## 2009-09-06 ENCOUNTER — Ambulatory Visit: Payer: Self-pay | Admitting: Vascular Surgery

## 2009-09-06 DIAGNOSIS — M1990 Unspecified osteoarthritis, unspecified site: Secondary | ICD-10-CM | POA: Insufficient documentation

## 2009-09-06 DIAGNOSIS — M79609 Pain in unspecified limb: Secondary | ICD-10-CM | POA: Insufficient documentation

## 2009-09-08 DIAGNOSIS — M7120 Synovial cyst of popliteal space [Baker], unspecified knee: Secondary | ICD-10-CM | POA: Insufficient documentation

## 2009-09-13 ENCOUNTER — Ambulatory Visit: Payer: Self-pay | Admitting: Family Medicine

## 2009-09-13 DIAGNOSIS — L570 Actinic keratosis: Secondary | ICD-10-CM | POA: Insufficient documentation

## 2009-09-15 ENCOUNTER — Telehealth (INDEPENDENT_AMBULATORY_CARE_PROVIDER_SITE_OTHER): Payer: Self-pay | Admitting: *Deleted

## 2009-10-04 ENCOUNTER — Telehealth: Payer: Self-pay | Admitting: Family Medicine

## 2009-11-02 ENCOUNTER — Ambulatory Visit: Payer: Self-pay | Admitting: Family Medicine

## 2009-11-08 ENCOUNTER — Telehealth: Payer: Self-pay | Admitting: Family Medicine

## 2009-11-30 ENCOUNTER — Ambulatory Visit: Payer: Self-pay | Admitting: Oncology

## 2009-12-02 ENCOUNTER — Ambulatory Visit (HOSPITAL_COMMUNITY): Admission: RE | Admit: 2009-12-02 | Discharge: 2009-12-02 | Payer: Self-pay | Admitting: Oncology

## 2009-12-02 ENCOUNTER — Encounter: Admission: RE | Admit: 2009-12-02 | Discharge: 2009-12-02 | Payer: Self-pay | Admitting: Oncology

## 2009-12-02 LAB — COMPREHENSIVE METABOLIC PANEL
AST: 27 U/L (ref 0–37)
Alkaline Phosphatase: 51 U/L (ref 39–117)
BUN: 14 mg/dL (ref 6–23)
Creatinine, Ser: 0.93 mg/dL (ref 0.40–1.20)
Total Bilirubin: 0.8 mg/dL (ref 0.3–1.2)

## 2009-12-12 ENCOUNTER — Ambulatory Visit: Payer: Self-pay | Admitting: Family Medicine

## 2009-12-20 ENCOUNTER — Ambulatory Visit: Payer: Self-pay | Admitting: Family Medicine

## 2009-12-20 LAB — CONVERTED CEMR LAB
Bilirubin Urine: NEGATIVE
Blood in Urine, dipstick: NEGATIVE
Ketones, urine, test strip: NEGATIVE
Protein, U semiquant: NEGATIVE
Specific Gravity, Urine: 1.015
Urobilinogen, UA: 0.2
pH: 6.5

## 2009-12-21 ENCOUNTER — Encounter: Payer: Self-pay | Admitting: Family Medicine

## 2010-01-04 ENCOUNTER — Encounter: Payer: Self-pay | Admitting: Family Medicine

## 2010-02-01 ENCOUNTER — Telehealth: Payer: Self-pay | Admitting: Family Medicine

## 2010-02-06 ENCOUNTER — Encounter: Payer: Self-pay | Admitting: Family Medicine

## 2010-03-06 ENCOUNTER — Telehealth: Payer: Self-pay | Admitting: Family Medicine

## 2010-04-19 ENCOUNTER — Telehealth: Payer: Self-pay | Admitting: Family Medicine

## 2010-04-26 ENCOUNTER — Ambulatory Visit: Payer: Self-pay | Admitting: Family Medicine

## 2010-05-04 ENCOUNTER — Ambulatory Visit: Payer: Self-pay | Admitting: Family Medicine

## 2010-05-24 ENCOUNTER — Ambulatory Visit
Admission: RE | Admit: 2010-05-24 | Discharge: 2010-05-24 | Payer: Self-pay | Source: Home / Self Care | Attending: Family Medicine | Admitting: Family Medicine

## 2010-05-24 ENCOUNTER — Encounter: Payer: Self-pay | Admitting: Family Medicine

## 2010-05-24 ENCOUNTER — Other Ambulatory Visit: Payer: Self-pay | Admitting: Family Medicine

## 2010-05-24 LAB — BASIC METABOLIC PANEL
BUN: 17 mg/dL (ref 6–23)
CO2: 29 mEq/L (ref 19–32)
Calcium: 9.6 mg/dL (ref 8.4–10.5)
Chloride: 102 mEq/L (ref 96–112)
Creatinine, Ser: 0.7 mg/dL (ref 0.4–1.2)
GFR: 83.81 mL/min (ref >60.00)
Glucose, Bld: 87 mg/dL (ref 70–99)
Potassium: 4.3 mEq/L (ref 3.5–5.1)
Sodium: 139 mEq/L (ref 135–145)

## 2010-05-24 LAB — HEPATIC FUNCTION PANEL
ALT: 13 U/L (ref 0–35)
AST: 18 U/L (ref 0–37)
Albumin: 4.1 g/dL (ref 3.5–5.2)
Alkaline Phosphatase: 58 U/L (ref 39–117)
Bilirubin, Direct: 0.1 mg/dL (ref 0.0–0.3)
Total Bilirubin: 1 mg/dL (ref 0.3–1.2)
Total Protein: 6.8 g/dL (ref 6.0–8.3)

## 2010-05-24 LAB — LIPID PANEL
Cholesterol: 257 mg/dL — ABNORMAL HIGH (ref 0–200)
HDL: 48 mg/dL (ref >39.00)
Total CHOL/HDL Ratio: 5
Triglycerides: 165 mg/dL — ABNORMAL HIGH (ref 0.0–149.0)
VLDL: 33 mg/dL (ref 0.0–40.0)

## 2010-05-24 LAB — LDL CHOLESTEROL, DIRECT: Direct LDL: 201.2 mg/dL

## 2010-05-29 ENCOUNTER — Telehealth: Payer: Self-pay | Admitting: Family Medicine

## 2010-05-29 ENCOUNTER — Encounter: Payer: Self-pay | Admitting: Oncology

## 2010-06-06 NOTE — Assessment & Plan Note (Signed)
Summary: migranes in eyes.kdc   Vital Signs:  Patient profile:   70 year old female Weight:      177.50 pounds Temp:     98.1 degrees F oral Pulse rate:   82 / minute Pulse rhythm:   regular BP sitting:   124 / 80  (left arm) Cuff size:   large  Vitals Entered By: Army Fossa CMA (August 11, 2009 10:49 AM) CC: Pt states that she had 3 Migraines in her eyes last week.    History of Present Illness: Pt with Hx ocular migraines--- usually 1-2 a year but recently it has been more frequent.  She had 3 last week--- She says it start with her feeling like she is looking through broken glass and it lasts 20-30 min.  Pt is under a lot of stress and that seems to be the thing that triggered them.   Current Medications (verified): 1)  Soma 350 Mg  Tabs (Carisoprodol) .Marland Kitchen.. 1 By Mouth At Bedtime As Needed 2)  Oxycodone-Acetaminophen 5-325 Mg  Tabs (Oxycodone-Acetaminophen) .Marland Kitchen.. 1-2  By Mouth Two Times A Day As Needed 3)  Cartia Xt 240 Mg  Cp24 (Diltiazem Hcl Coated Beads) .Marland Kitchen.. 1 By Mouth Once Daily 4)  Atenolol 50 Mg  Tabs (Atenolol) .Marland Kitchen.. 1 By Mouth Two Times A Day 5)  Quinapril Hcl 40 Mg  Tabs (Quinapril Hcl) .Marland Kitchen.. 1 By Mouth Once Daily 6)  Pristiq 100 Mg Xr24h-Tab (Desvenlafaxine Succinate) .... As Directed 7)  Tamoxifen Citrate 20 Mg  Tabs (Tamoxifen Citrate) .Marland Kitchen.. 1 By Mouth Once Daily 8)  Temazepam 15 Mg  Caps (Temazepam) .Marland Kitchen.. 1 By Mouth As Needed 9)  Neurontin 300 Mg Caps (Gabapentin) .Marland Kitchen.. 1 By Mouth At Bedtime As Needed 10)  Ibuprofen 800 Mg Tabs (Ibuprofen) .Marland Kitchen.. 1 By Mouth Three Times A Day As Needed 11)  Ambien 10 Mg Tabs (Zolpidem Tartrate) .Marland Kitchen.. 1 By Mouth At Bedtime 12)  Xyzal 5 Mg Tabs (Levocetirizine Dihydrochloride) .Marland Kitchen.. 1 By Mouth Once Daily 13)  Alprazolam 0.25 Mg Tabs (Alprazolam) .... As Directed For Procedure  Allergies: 1)  ! Pcn 2)  ! * Flu Vaccination 3)  ! Biaxin 4)  ! Macrobid (Nitrofurantoin Educational psychologist)  Past History:  Past medical, surgical, family and  social histories (including risk factors) reviewed for relevance to current acute and chronic problems.  Past Medical History: Reviewed history from 12/25/2007 and no changes required. 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 Thyroid Surgery-1960 Appendectomy Hernia Surgery Lumpectomy of Left breast x 2 Hysterectomy  Family History: Reviewed history from 02/27/2008 and no changes required. Family History of CAD Female 1st degree relative <50, father M died brain tumor and 27yo Family History Liver disease, uncle (liver cancer) Family History of Alcoholism/Addiction No FH of Colon Cancer:  Social History: Reviewed history from 06/07/2008 and no changes required. Patient has never smoked.  Alcohol Use - no Daily Caffeine Use- 2 glasses daily Illicit Drug Use - no Patient  does not get regular exercise.  Occupation: [employer] 1 day a week Never Smoked  Review of Systems      See HPI  Physical Exam  General:  Well-developed,well-nourished,in no acute distress; alert,appropriate and cooperative throughout examination Eyes:  pupils equal, pupils round, pupils reactive to  light, and no injection.   Neck:  No deformities, masses, or tenderness noted. Lungs:  Normal respiratory effort, chest expands symmetrically. Lungs are clear to auscultation, no crackles or wheezes. Heart:  Normal rate and regular rhythm. S1 and S2 normal without gallop, murmur, click, rub or other extra sounds. Msk:  normal ROM.   Extremities:  No clubbing, cyanosis, edema, or deformity noted with normal full range of motion of all joints.   Neurologic:  alert & oriented X3, cranial nerves II-XII intact, and strength normal in all extremities.   Psych:  Oriented X3, depressed affect, and tearful.     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 1-2  by mouth two times a day as needed    Atenolol 50 Mg Tabs (Atenolol) .Marland Kitchen... 1 by mouth two times a day    Ibuprofen 800 Mg Tabs (Ibuprofen) .Marland Kitchen... 1 by mouth three times a day as needed  Orders: Ophthalmology Referral (Ophthalmology) Radiology Referral (Radiology)  Problem # 2:  BLURRED VISION (ICD-368.8)  Orders: Ophthalmology Referral (Ophthalmology)  Problem # 3:  DEPRESSIVE DISORDER (ICD-311)  Her updated medication list for this problem includes:    Pristiq 100 Mg Xr24h-tab (Desvenlafaxine succinate) .Marland Kitchen... As directed    Alprazolam 0.25 Mg Tabs (Alprazolam) .Marland Kitchen... As directed for procedure  Complete Medication List: 1)  Soma 350 Mg Tabs (Carisoprodol) .Marland Kitchen.. 1 by mouth at bedtime as needed 2)  Oxycodone-acetaminophen 5-325 Mg Tabs (Oxycodone-acetaminophen) .Marland Kitchen.. 1-2  by mouth two times a day as needed 3)  Cartia Xt 240 Mg Cp24 (Diltiazem hcl coated beads) .Marland Kitchen.. 1 by mouth once daily 4)  Atenolol 50 Mg Tabs (Atenolol) .Marland Kitchen.. 1 by mouth two times a day 5)  Quinapril Hcl 40 Mg Tabs (Quinapril hcl) .Marland Kitchen.. 1 by mouth once daily 6)  Pristiq 100 Mg Xr24h-tab (Desvenlafaxine succinate)  .... As directed 7)  Tamoxifen Citrate 20 Mg Tabs (Tamoxifen citrate) .Marland Kitchen.. 1 by mouth once daily 8)  Temazepam 15 Mg Caps (Temazepam) .Marland Kitchen.. 1 by mouth as needed 9)  Neurontin 300 Mg Caps (Gabapentin) .Marland Kitchen.. 1 by mouth at bedtime as needed 10)  Ibuprofen 800 Mg Tabs (Ibuprofen) .Marland Kitchen.. 1 by mouth three times a day as needed 11)  Ambien 10 Mg Tabs (Zolpidem tartrate) .Marland Kitchen.. 1 by mouth at bedtime 12)  Xyzal 5 Mg Tabs (Levocetirizine dihydrochloride) .Marland Kitchen.. 1 by mouth once daily 13)  Alprazolam 0.25 Mg Tabs (Alprazolam) .... As directed for procedure Prescriptions: ALPRAZOLAM 0.25 MG TABS (ALPRAZOLAM) as directed for procedure  #10 x 0   Entered and Authorized by:   Loreen Freud DO   Signed by:   Loreen Freud DO on 08/11/2009   Method used:   Print then Give to Patient   RxID:   [id number] OXYCODONE-ACETAMINOPHEN 5-325 MG  TABS (OXYCODONE-ACETAMINOPHEN) 1-2  by mouth two times a day as needed  #90 x 0   Entered and Authorized by:   Loreen Freud DO   Signed by:   Loreen Freud DO on 08/11/2009   Method used:   Print then Give to Patient   RxID:  [id number]  

## 2010-06-06 NOTE — Assessment & Plan Note (Signed)
Summary: KNEE PAIN/RH..........Marland Kitchen   Vital Signs:  Patient profile:   70 year old female Weight:      178 pounds Pulse rate:   76 / minute Pulse rhythm:   regular BP sitting:   134 / 80  (left arm) Cuff size:   regular  Vitals Entered By: Army Fossa CMA (Sep 06, 2009 3:01 PM) CC: Pt here c/o left knee pain that is radiating down into her calf. x 1 week.    History of Present Illness: Pt here c/o b/l knee pain esp with weather change.   No relief with percocet or ibuprofen.  Pt also c/o L calf pain.  No sob or cp.  Pt is wearing knee sleeves.    No known injury.  xrays done in past--arthritis.    Current Medications (verified): 1)  Soma 350 Mg  Tabs (Carisoprodol) .Marland Kitchen.. 1 By Mouth At Bedtime As Needed 2)  Oxycodone-Acetaminophen 5-325 Mg  Tabs (Oxycodone-Acetaminophen) .Marland Kitchen.. 1-2  By Mouth Two Times A Day As Needed 3)  Cartia Xt 240 Mg  Cp24 (Diltiazem Hcl Coated Beads) .Marland Kitchen.. 1 By Mouth Once Daily 4)  Atenolol 50 Mg  Tabs (Atenolol) .Marland Kitchen.. 1 By Mouth Two Times A Day 5)  Quinapril Hcl 40 Mg  Tabs (Quinapril Hcl) .Marland Kitchen.. 1 By Mouth Once Daily 6)  Pristiq 100 Mg Xr24h-Tab (Desvenlafaxine Succinate) .... As Directed 7)  Temazepam 15 Mg  Caps (Temazepam) .Marland Kitchen.. 1 By Mouth As Needed 8)  Celebrex 200 Mg Caps (Celecoxib) .Marland Kitchen.. 1 By Mouth Once Daily 9)  Ambien 10 Mg Tabs (Zolpidem Tartrate) .Marland Kitchen.. 1 By Mouth At Bedtime 10)  Levaquin 500 Mg Tabs (Levofloxacin) .Marland Kitchen.. 1 By Mouth Once Daily 11)  Elocon 0.1 % Lotn (Mometasone Furoate) .... Apply Once Daily 12)  Astelin 137 Mcg/spray Soln (Azelastine Hcl) .... One Spray Each Nostril At Bedtime 13)  Ipratropium Bromide 0.06 % Soln (Ipratropium Bromide) .Marland Kitchen.. 1-2 Sprays Each Nostril Up To Three Times A Day As Needed 14)  Pennsaid 1.5 % Soln (Diclofenac Sodium) .... 40 Gtts Applied To Each Knee Qid As Needed  Allergies: 1)  ! Pcn 2)  ! * Flu Vaccination 3)  ! Biaxin 4)  ! Macrobid (Nitrofurantoin Educational psychologist)  Past History:  Past medical, surgical,  family and social histories (including risk factors) reviewed for relevance to current acute and chronic problems.  Past Medical History: Reviewed history from 12/25/2007 and no changes required. 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 Thyroid Surgery-1960 Appendectomy Hernia Surgery Lumpectomy of Left breast x 2 Hysterectomy  Family History: Reviewed history from 02/27/2008 and no changes required. Family History of CAD Female 1st degree relative <50, father M died brain tumor and 27yo Family History Liver disease, uncle (liver cancer) Family History of Alcoholism/Addiction No FH of Colon Cancer:  Social History: Reviewed history from 09/02/2009 and no changes required. Patient has never smoked.  Alcohol Use - no Daily Caffeine Use- 2 glasses daily Illicit Drug Use -  no Patient does not get regular exercise.  Occupation: [employer] 1 day a week (Retired otherwise) Never Smoked Divorced lives with son  Review of Systems      See HPI  Physical Exam  General:  Well-developed,well-nourished,in no acute distress; alert,appropriate and cooperative throughout  examination Msk:  L knee + swelling with tendernss inf- med patella + L calf pain R knee less swollen and no calf pain Extremities:  No clubbing, cyanosis, edema, or deformity noted with normal full range of motion of all joints.   Psych:  Oriented X3 and normally interactive.     Impression & Recommendations:  Problem # 1:  CALF PAIN, LEFT (ICD-729.5)  Orders: Doppler Referral (Doppler)  Problem # 2:  OSTEOARTHRITIS, KNEES, BILATERAL (ICD-715.96)  Her updated medication list for this problem includes:    Soma 350 Mg Tabs (Carisoprodol) .Marland Kitchen... 1 by mouth at bedtime as needed    Oxycodone-acetaminophen 5-325 Mg Tabs (Oxycodone-acetaminophen) .Marland Kitchen... 1-2  by mouth two times a day as needed    Celebrex 200 Mg Caps (Celecoxib) .Marland Kitchen... 1 by mouth once daily    sample pennsaid 40 gtts each knee qid as needed  Orders: Orthopedic Surgeon Referral (Ortho Surgeon)  Discussed strengthening exercises, use of ice or heat, and medications.   Complete Medication List: 1)  Soma 350 Mg Tabs (Carisoprodol) .Marland Kitchen.. 1 by mouth at bedtime as needed 2)  Oxycodone-acetaminophen 5-325 Mg Tabs (Oxycodone-acetaminophen) .Marland Kitchen.. 1-2  by mouth two times a day as needed 3)  Cartia Xt 240 Mg Cp24 (Diltiazem hcl coated beads) .Marland Kitchen.. 1 by mouth once daily 4)  Atenolol 50 Mg Tabs (Atenolol) .Marland Kitchen.. 1 by mouth two times a day 5)  Quinapril Hcl 40 Mg Tabs (Quinapril hcl) .Marland Kitchen.. 1 by mouth once daily 6)  Pristiq 100 Mg Xr24h-tab (Desvenlafaxine succinate) .... As directed 7)  Temazepam 15 Mg Caps (Temazepam) .Marland Kitchen.. 1 by mouth as needed 8)  Celebrex 200 Mg Caps (Celecoxib) .Marland Kitchen.. 1 by mouth once daily 9)  Ambien 10 Mg Tabs (Zolpidem tartrate) .Marland Kitchen.. 1 by mouth at bedtime 10)  Levaquin 500 Mg Tabs (Levofloxacin) .Marland Kitchen.. 1 by mouth once daily 11)  Elocon 0.1 % Lotn (Mometasone furoate) .... Apply once daily 12)  Astelin 137 Mcg/spray Soln (Azelastine hcl) .... One spray each nostril at bedtime 13)  Ipratropium Bromide 0.06 % Soln  (Ipratropium bromide) .Marland Kitchen.. 1-2 sprays each nostril up to three times a day as needed 14)  Pennsaid 1.5 % Soln (Diclofenac sodium) .... 40 gtts applied to each knee qid as needed

## 2010-06-06 NOTE — Assessment & Plan Note (Signed)
Summary: allergic rhinitis, cause unspecified/apc   Vital Signs:  Patient profile:   70 year old female Height:      69 inches Weight:      180 pounds BMI:     26.68 O2 Sat:      96 % on Room air Pulse rate:   72 / minute BP sitting:   138 / 82  (right arm) Cuff size:   regular  Vitals Entered By: Zackery Barefoot CMA (September 02, 2009 3:05 PM)  O2 Flow:  Room air  Primary Provider/Referring Provider:  Loreen Freud DO  CC:  Pt here for allergy consult. Pt c/o runny nose x 2 years worse when she is working or rushing and better when sitting to rest.  History of Present Illness: September 02, 2009- 70 yoF referred by Dr Laury Axon concerned about intermittent watery rhinorhea.This has been a problem for 2 years. She works part time as a Education administrator at [employer] and it gets in her way to have to keep removing gloves to blow her nose. These episodes also seem to happen when she feels rushed, such as at grocery store or when eating at a restaurant. Denies itch, sneeze, burning or purulent discharge. She failed Astelin, Xyzal, diphenhydramine. Had allergic rhinitis while living in [place] and was skin test positive then, on allergy vaccine and also used Afrin for congestion. Moved here 5 years ago and no longer feels congested. No hx of nasal surgery.  Current Medications (verified): 1)  Soma 350 Mg  Tabs (Carisoprodol) .Marland Kitchen.. 1 By Mouth At Bedtime As Needed 2)  Oxycodone-Acetaminophen 5-325 Mg  Tabs (Oxycodone-Acetaminophen) .Marland Kitchen.. 1-2  By Mouth Two Times A Day As Needed 3)  Cartia Xt 240 Mg  Cp24 (Diltiazem Hcl Coated Beads) .Marland Kitchen.. 1 By Mouth Once Daily 4)  Atenolol 50 Mg  Tabs (Atenolol) .Marland Kitchen.. 1 By Mouth Two Times A Day 5)  Quinapril Hcl 40 Mg  Tabs (Quinapril Hcl) .Marland Kitchen.. 1 By Mouth Once Daily 6)  Pristiq 100 Mg Xr24h-Tab (Desvenlafaxine Succinate) .... As Directed 7)  Temazepam 15 Mg  Caps (Temazepam) .Marland Kitchen.. 1 By Mouth As Needed 8)  Ibuprofen 800 Mg Tabs (Ibuprofen) .Marland Kitchen.. 1 By Mouth Three Times A Day As  Needed 9)  Ambien 10 Mg Tabs (Zolpidem Tartrate) .Marland Kitchen.. 1 By Mouth At Bedtime 10)  Levaquin 500 Mg Tabs (Levofloxacin) .Marland Kitchen.. 1 By Mouth Once Daily 11)  Elocon 0.1 % Lotn (Mometasone Furoate) .... Apply Once Daily 12)  Astelin 137 Mcg/spray Soln (Azelastine Hcl) .... One Spray Each Nostril At Bedtime  Allergies (verified): 1)  ! Pcn 2)  ! * Flu Vaccination 3)  ! Biaxin 4)  ! 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) SINUSITIS, ACUTE NOS (ICD-461.9) TMJ PAIN (ICD-524.62) FAMILY HISTORY OF ALCOHOLISM/ADDICTION (ICD-V61.41) FAMILY HISTORY OF CAD FEMALE 1ST DEGREE RELATIVE <50 (ICD-V17.3) OSTEOARTHRITIS (ICD-715.90) HYPERTENSION (ICD-401.9) IBS (ICD-564.1) THYROID NODULE (ICD-241.0) ARTHRITIS (ICD-716.90) HYPERTENSION (ICD-401.9)  Past Surgical History: Last updated: 02/27/2008 Thyroid Surgery-1960 Appendectomy Hernia Surgery Lumpectomy of Left breast x 2 Hysterectomy  Family History: Last updated: 02/27/2008 Family History of CAD Female 1st degree relative <50, father M died brain tumor and 27yo Family History Liver  disease, uncle (liver cancer) Family History of Alcoholism/Addiction No FH of Colon Cancer:  Social History: Last updated: 09/02/2009 Patient has never smoked.  Alcohol Use - no Daily Caffeine Use- 2 glasses daily Illicit Drug Use - no Patient does not get regular exercise.  Occupation: [employer] 1 day a week (Retired otherwise) Never Smoked Divorced  lives with son  Risk Factors: Exercise: no (02/27/2008)  Risk Factors: Smoking Status: never (06/07/2008)  Social History: Patient has never smoked.  Alcohol Use - no Daily Caffeine Use- 2 glasses daily Illicit Drug Use - no Patient does not get regular exercise.  Occupation: [employer] 1 day a week (Retired otherwise) Never Smoked Divorced lives with son  Review of Systems      See HPI       The patient complains of anxiety and depression.  The patient denies shortness of breath with activity, shortness of breath at rest, productive cough, non-productive cough, coughing up blood, chest pain, irregular heartbeats, acid heartburn, indigestion, loss of appetite, weight change, abdominal pain, difficulty swallowing, sore throat, tooth/dental problems, headaches, nasal congestion/difficulty breathing through nose, sneezing, itching, ear ache, hand/feet swelling, joint stiffness or pain, rash, change in color of mucus, and fever.         epistaxis x 2 in past  Physical Exam  Additional Exam:  General: A/Ox3; pleasant and cooperative, NAD, SKIN: no rash, lesions NODES: no lymphadenopathy HEENT: 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 If ipratropium takes care of it we won't need to do more. The following medications were removed from the medication list:    Xyzal 5 Mg Tabs (Levocetirizine dihydrochloride) .Marland Kitchen... 1 by mouth once daily Her updated medication list for  this problem includes:    Astelin 137 Mcg/spray Soln (Azelastine hcl) ..... One spray each nostril at bedtime    Ipratropium Bromide 0.06 % Soln (Ipratropium bromide) .Marland Kitchen... 1-2 sprays each nostril up to three times a day as needed  Medications Added to Medication List This Visit: 1)  Astelin 137 Mcg/spray Soln (Azelastine hcl) .... One spray each nostril at bedtime 2)  Ipratropium Bromide 0.06 % Soln (Ipratropium bromide) .Marland Kitchen.. 1-2 sprays each nostril up to three times a day as needed  Other Orders: Consultation Level III (40102) Prescription Created Electronically [id number])  Patient Instructions: 1)  Please schedule a follow-up appointment as needed. 2)  Script for ipratropium nasal spray sent to your drug store. 3)  If this doesn't help, come back and I will see what else I can do. Prescriptions: IPRATROPIUM BROMIDE 0.06 % SOLN (IPRATROPIUM BROMIDE) 1-2 sprays each nostril up to three times a day as needed  #1 x prn   Entered and Authorized by:   Waymon Budge MD   Signed by:   Waymon Budge MD on 09/02/2009   Method used:   Electronically to        [facility] [address] (retail)       [phone]  [place], [place]  [address]       Ph: [phone]       Fax: [phone]   RxID:   [id number]

## 2010-06-06 NOTE — Progress Notes (Signed)
Summary: Pathology Report  Phone Note Outgoing Call   Call placed by: Army Fossa CMA,  Sep 15, 2009 8:21 AM Summary of Call: Called pt and left message with a female:  Pathology report- Benign.   Follow-up for Phone Call        Spoke with patient and gave her the information.  Follow-up by: Harold Barban,  Sep 15, 2009 12:23 PM

## 2010-06-06 NOTE — Consult Note (Signed)
Summary: [facility]  Consultation [facility]   Imported By: Lanelle Bal 01/18/2010 12:18:55  _____________________________________________________________________  External Attachment:    Type:   Image     Comment:   External Document

## 2010-06-06 NOTE — Miscellaneous (Signed)
Summary: Consent for Special Procedure/[facility]  Consent for Special Procedure/[facility]   Imported By: Lanelle Bal 09/19/2009 09:48:00  _____________________________________________________________________  External Attachment:    Type:   Image     Comment:   External Document

## 2010-06-06 NOTE — Assessment & Plan Note (Signed)
Summary: DISCUSS TEST REULTS, STILL NOT FEELING WELL--NEEDED TO WAIT U...   Vital Signs:  Patient profile:   70 year old female Weight:      180 pounds Temp:     97.9 degrees F oral Pulse rate:   76 / minute Pulse rhythm:   regular BP sitting:   138 / 78  (left arm) Cuff size:   large  Vitals Entered By: Army Fossa CMA (July 28, 2009 4:15 PM) CC: Pt here to discuss lab results   History of Present Illness: Pt here to discuss her labs results.    Current Medications (verified): 1)  Soma 350 Mg  Tabs (Carisoprodol) .Marland Kitchen.. 1 By Mouth At Bedtime As Needed 2)  Oxycodone-Acetaminophen 5-325 Mg  Tabs (Oxycodone-Acetaminophen) .Marland Kitchen.. 1-2  By Mouth Two Times A Day As Needed 3)  Cartia Xt 240 Mg  Cp24 (Diltiazem Hcl Coated Beads) .Marland Kitchen.. 1 By Mouth Once Daily 4)  Atenolol 50 Mg  Tabs (Atenolol) .Marland Kitchen.. 1 By Mouth Two Times A Day 5)  Quinapril Hcl 40 Mg  Tabs (Quinapril Hcl) .Marland Kitchen.. 1 By Mouth Once Daily 6)  Pristiq 100 Mg Xr24h-Tab (Desvenlafaxine Succinate) .... As Directed 7)  Tamoxifen Citrate 20 Mg  Tabs (Tamoxifen Citrate) .Marland Kitchen.. 1 By Mouth Once Daily 8)  Temazepam 15 Mg  Caps (Temazepam) .Marland Kitchen.. 1 By Mouth As Needed 9)  Neurontin 300 Mg Caps (Gabapentin) .Marland Kitchen.. 1 By Mouth At Bedtime As Needed 10)  Ibuprofen 800 Mg Tabs (Ibuprofen) .Marland Kitchen.. 1 By Mouth Three Times A Day As Needed 11)  Pristiq 50 Mg Xr24h-Tab (Desvenlafaxine Succinate) .... Take One Tablet Daily. 12)  Ambien 10 Mg Tabs (Zolpidem Tartrate) .Marland Kitchen.. 1 By Mouth At Bedtime 13)  Xyzal 5 Mg Tabs (Levocetirizine Dihydrochloride) .Marland Kitchen.. 1 By Mouth Once Daily  Allergies: 1)  ! Pcn 2)  ! * Flu Vaccination 3)  ! Biaxin 4)  ! Macrobid (Nitrofurantoin Educational psychologist)  Past History:  Past medical, surgical, family and social histories (including risk factors) reviewed for relevance to current acute and chronic problems.  Past Medical History: Reviewed history from 12/25/2007 and no changes required. 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 Thyroid Surgery-1960 Appendectomy Hernia Surgery Lumpectomy of Left breast x 2 Hysterectomy  Family History: Reviewed history from 02/27/2008 and no changes required. Family History of CAD Female 1st degree relative <50, father M died brain tumor and 27yo Family History Liver disease, uncle (liver cancer) Family History of Alcoholism/Addiction No FH of Colon Cancer:  Social History: Reviewed history from 06/07/2008 and no changes required. Patient has never smoked.  Alcohol Use - no Daily Caffeine Use- 2 glasses daily Illicit Drug Use - no Patient does not get regular exercise.  Occupation: [employer] 1 day a week Never Smoked  Review of Systems      See HPI  Physical Exam  General:  Well-developed,well-nourished,in no acute distress; alert,appropriate and cooperative throughout examination Neck:  No deformities, masses, or tenderness noted. Psych:  Oriented  X3, normally interactive, good eye contact, not anxious appearing, and not depressed appearing.     Impression & Recommendations:  Problem # 1:  UNSPECIFIED VITAMIN D DEFICIENCY (ICD-268.9) see labs level  low take vita D3 2000u daily---recheck 3 months  Problem # 2:  DEPRESSIVE DISORDER (ICD-311)  The following medications were removed from the medication list:    Pristiq 50 Mg Xr24h-tab (Desvenlafaxine succinate) .Marland Kitchen... Take one tablet daily. Her updated medication list for this problem includes:    Pristiq 100 Mg Xr24h-tab (Desvenlafaxine succinate) .Marland Kitchen... As directed  Complete Medication List: 1)  Soma 350 Mg Tabs (Carisoprodol) .Marland Kitchen.. 1 by mouth at bedtime as needed 2)  Oxycodone-acetaminophen 5-325 Mg Tabs (Oxycodone-acetaminophen) .Marland Kitchen.. 1-2  by mouth two times a day as needed 3)  Cartia Xt 240 Mg Cp24 (Diltiazem hcl coated beads) .Marland Kitchen.. 1 by mouth once daily 4)  Atenolol 50 Mg Tabs (Atenolol) .Marland Kitchen.. 1 by mouth two times a day 5)  Quinapril Hcl 40 Mg Tabs (Quinapril hcl) .Marland Kitchen.. 1 by mouth once daily 6)  Pristiq 100 Mg Xr24h-tab (Desvenlafaxine succinate) .... As directed 7)  Tamoxifen Citrate 20 Mg Tabs (Tamoxifen citrate) .Marland Kitchen.. 1 by mouth once daily 8)  Temazepam 15 Mg Caps (Temazepam) .Marland Kitchen.. 1 by mouth as needed 9)  Neurontin 300 Mg Caps (Gabapentin) .Marland Kitchen.. 1 by mouth at bedtime as needed 10)  Ibuprofen 800 Mg Tabs (Ibuprofen) .Marland Kitchen.. 1 by mouth three times a day as needed 11)  Ambien 10 Mg Tabs (Zolpidem tartrate) .Marland Kitchen.. 1 by mouth at bedtime 12)  Xyzal 5 Mg Tabs (Levocetirizine dihydrochloride) .Marland Kitchen.. 1 by mouth once daily  Patient Instructions: 1)  Take Fish Oil /flaxseed oil  4g total  daily 2)  recheck labs 6 months--fasting lipid, hep 272.4 3)  recheck vita D 3 months Prescriptions: AMBIEN 10 MG TABS (ZOLPIDEM TARTRATE) 1 by mouth at bedtime  #90 x 0   Entered and Authorized by:   Loreen Freud DO   Signed by:   Loreen Freud DO on 07/28/2009   Method used:   Print then Give to Patient   RxID:   [id number] ATENOLOL 50 MG  TABS (ATENOLOL) 1 by mouth two times a day  #180 x 3   Entered and Authorized by:   Loreen Freud DO   Signed by:   Loreen Freud DO on 07/28/2009   Method used:    Electronically to        [facility]* (mail-order)       [address]       [place], [place]  [address]       Ph: [phone]       Fax: [phone]   RxID:   [id number]

## 2010-06-06 NOTE — Progress Notes (Signed)
Summary: oxycodone refill  Phone Note Refill Request Call back at Home Phone [phone] Message from:  Patient on March 06, 2010 2:10 PM  Refills Requested: Medication #1:  OXYCODONE-ACETAMINOPHEN 5-325 MG  TABS 1-2  by mouth two times a day as needed Please call [phone] when ready for pickup  Initial call taken by: Jerolyn Shin,  March 06, 2010 2:10 PM  Follow-up for Phone Call        last seen 12/20/09 last filled 02/03/10... Please advise Follow-up by: Almeta Monas CMA Duncan Dull),  March 06, 2010 3:52 PM  Additional Follow-up for Phone Call Additional follow up Details #1::        refill x1 Additional Follow-up by: Loreen Freud DO,  March 06, 2010 4:04 PM    Additional Follow-up for Phone Call Additional follow up Details #2::    pt aware Rx wil be ready in the morning..... Almeta Monas CMA Duncan Dull)  March 06, 2010 4:56 PM  Follow-up by: Almeta Monas CMA Duncan Dull),  March 06, 2010 4:56 PM  Prescriptions: OXYCODONE-ACETAMINOPHEN 5-325 MG  TABS (OXYCODONE-ACETAMINOPHEN) 1-2  by mouth two times a day as needed  #90 x 0   Entered by:   Almeta Monas CMA (AAMA)   Authorized by:   Loreen Freud DO   Signed by:   Almeta Monas CMA (AAMA) on 03/06/2010   Method used:   Print then Give to Patient   RxID:   [id number]

## 2010-06-06 NOTE — Consult Note (Signed)
Summary: [facility]   Imported By: Lanelle Bal 08/22/2009 10:48:17  _____________________________________________________________________  External Attachment:    Type:   Image     Comment:   External Document

## 2010-06-06 NOTE — Letter (Signed)
Summary: [facility]   Imported By: Lanelle Bal 02/16/2010 07:56:56  _____________________________________________________________________  External Attachment:    Type:   Image     Comment:   External Document

## 2010-06-06 NOTE — Progress Notes (Signed)
Summary: X-ray results   Phone Note Outgoing Call   Summary of Call: Regarding x-ray results, LMTCB:  mild arthritis Initial call taken by: Army Fossa CMA,  May 20, 2009 8:07 AM  Follow-up for Phone Call        pt is aware. Army Fossa CMA  May 20, 2009 11:18 AM

## 2010-06-06 NOTE — Progress Notes (Signed)
Summary: refill  Phone Note Refill Request Call back at Home Phone [phone] Message from:  Fax from Pharmacy on July 13, 2009 3:58 PM  Refills Requested: Medication #1:  OXYCODONE-ACETAMINOPHEN 5-325 MG  TABS 1-2  by mouth two times a day as needed  Method Requested: Pick up at Office Next Appointment Scheduled: no appt Initial call taken by: Barb Merino,  July 13, 2009 3:58 PM  Follow-up for Phone Call        05-19-09 last OV, last refill........................Marland KitchenFelecia Deloach CMA  July 13, 2009 4:54 PM    Additional Follow-up for Phone Call Additional follow up Details #1::        ok to refill x1 Additional Follow-up by: Loreen Freud DO,  July 13, 2009 7:30 PM    Additional Follow-up for Phone Call Additional follow up Details #2::    left message that rx was ready. Army Fossa CMA  July 14, 2009 8:03 AM   Prescriptions: OXYCODONE-ACETAMINOPHEN 5-325 MG  TABS (OXYCODONE-ACETAMINOPHEN) 1-2  by mouth two times a day as needed  #90 x 0   Entered by:   Army Fossa CMA   Authorized by:   Loreen Freud DO   Signed by:   Army Fossa CMA on 07/14/2009   Method used:   Print then Give to Patient   RxID:   [id number]

## 2010-06-06 NOTE — Miscellaneous (Signed)
Summary: Consent to Special Procedure  Consent to Special Procedure   Imported By: Lanelle Bal 12/20/2009 09:31:58  _____________________________________________________________________  External Attachment:    Type:   Image     Comment:   External Document

## 2010-06-06 NOTE — Assessment & Plan Note (Signed)
Summary: for mole remover and script//ph   Vital Signs:  Patient profile:   70 year old female Height:      69 inches Weight:      179 pounds Temp:     98.8 degrees F oral Pulse rate:   76 / minute BP sitting:   140 / 78  (left arm)  Vitals Entered By: Jeremy Johann CMA (December 12, 2009 3:28 PM) CC: mole removal   History of Present Illness: Pt here for mole removal.  Mole on scalp grew back and is irritated and itchy.  Current Medications (verified): 1)  Soma 350 Mg  Tabs (Carisoprodol) .Marland Kitchen.. 1 By Mouth At Bedtime As Needed 2)  Oxycodone-Acetaminophen 5-325 Mg  Tabs (Oxycodone-Acetaminophen) .Marland Kitchen.. 1-2  By Mouth Two Times A Day As Needed 3)  Cartia Xt 240 Mg  Cp24 (Diltiazem Hcl Coated Beads) .Marland Kitchen.. 1 By Mouth Once Daily 4)  Atenolol 50 Mg  Tabs (Atenolol) .Marland Kitchen.. 1 By Mouth Two Times A Day 5)  Quinapril Hcl 40 Mg  Tabs (Quinapril Hcl) .Marland Kitchen.. 1 By Mouth Once Daily 6)  Pristiq 100 Mg Xr24h-Tab (Desvenlafaxine Succinate) .... As Directed 7)  Temazepam 15 Mg  Caps (Temazepam) .Marland Kitchen.. 1 By Mouth As Needed 8)  Celebrex 200 Mg Caps (Celecoxib) .Marland Kitchen.. 1 By Mouth Once Daily 9)  Ambien 10 Mg Tabs (Zolpidem Tartrate) .Marland Kitchen.. 1 By Mouth At Bedtime 10)  Astelin 137 Mcg/spray Soln (Azelastine Hcl) .... One Spray Each Nostril At Bedtime 11)  Ipratropium Bromide 0.06 % Soln (Ipratropium Bromide) .Marland Kitchen.. 1-2 Sprays Each Nostril Up To Three Times A Day As Needed 12)  Pennsaid 1.5 % Soln (Diclofenac Sodium) .... 40 Gtts Applied To Each Knee Qid As Needed 13)  Ibuprofen 800 Mg Tabs (Ibuprofen) .Marland Kitchen.. 1 By Mouth Every 6 Hours  Allergies (verified): 1)  ! Pcn 2)  ! * Flu Vaccination 3)  ! Biaxin 4)  ! Macrobid (Nitrofurantoin Educational psychologist)  Past History:  Past medical, surgical, family and social histories (including risk factors) reviewed for relevance to current acute and chronic problems.  Past Medical History: Reviewed history from 12/25/2007 and no changes required. 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 Thyroid Surgery-1960 Appendectomy Hernia Surgery Lumpectomy of Left breast x 2 Hysterectomy  Family History: Reviewed history from 02/27/2008 and no changes required. Family History of CAD Female 1st degree relative <50, father M died brain tumor and 27yo Family History Liver disease, uncle (liver cancer) Family History of Alcoholism/Addiction No FH of Colon Cancer:  Social History: Reviewed history from 09/02/2009 and no changes required. Patient has never smoked.  Alcohol Use - no Daily Caffeine Use- 2 glasses daily Illicit Drug Use - no Patient does not get regular exercise.  Occupation: [employer] 1 day a week (Retired otherwise) Never Smoked Divorced lives with son  Review of Systems      See HPI   Impression & Recommendations:  Problem # 1:  SEBORRHEIC KERATOSIS, INFLAMED (ICD-702.11)  Complete Medication List: 1)  Soma 350 Mg Tabs (Carisoprodol) .Marland Kitchen.. 1 by mouth at bedtime as needed 2)  Oxycodone-acetaminophen 5-325 Mg Tabs (Oxycodone-acetaminophen) .Marland Kitchen.. 1-2  by mouth two times a day as needed 3)  Cartia Xt 240 Mg Cp24 (Diltiazem hcl coated beads) .Marland Kitchen.. 1 by  mouth once daily 4)  Atenolol 50 Mg Tabs (Atenolol) .Marland Kitchen.. 1 by mouth two times a day 5)  Quinapril Hcl 40 Mg Tabs (Quinapril hcl) .Marland Kitchen.. 1 by mouth once daily 6)  Pristiq 100 Mg Xr24h-tab (Desvenlafaxine succinate) .... As directed 7)  Temazepam 15 Mg Caps (Temazepam) .Marland Kitchen.. 1 by mouth as needed 8)  Celebrex 200 Mg Caps (Celecoxib) .Marland Kitchen.. 1 by mouth once daily 9)  Ambien 10 Mg Tabs (Zolpidem tartrate) .Marland Kitchen.. 1 by mouth at bedtime 10)  Astelin 137 Mcg/spray Soln (Azelastine hcl) .... One spray each nostril at bedtime 11)  Ipratropium Bromide 0.06 % Soln (Ipratropium bromide) .Marland Kitchen.. 1-2 sprays each nostril up to three times a day as needed 12)  Pennsaid 1.5 % Soln (Diclofenac sodium) .... 40 gtts applied to each knee qid as needed 13)  Ibuprofen 800 Mg Tabs (Ibuprofen) .Marland Kitchen.. 1 by mouth every 6 hours  Other Orders: Excise lesion ([facility]) 0 - 0.5 cm (11420)  Procedure Note  Mole Biopsy/Removal: The patient complains of irritation, inflammation, and changing mole. Indication: changing lesion Consent signed: yes  Procedure # 1: shave biopsy    Size (in cm): 0.5 x 0.2    Region: scalp    Instrument used: #15 blade    Anesthesia: 1% xylocaine   Cleaned and prepped with: alcohol and betadine Wound dressing: neosporin and bulky gauze dressing Instructions: daily dressing changes Additional Instructions: keep dry 24 hours  silver nitrate used to stop bleeding  Prescriptions: IBUPROFEN 800 MG TABS (IBUPROFEN) 1 by mouth every 6 hours  #90 x 1   Entered and Authorized by:   Loreen Freud DO   Signed by:   Loreen Freud DO on 12/12/2009   Method used:   Print then Give to Patient   RxID:   [id number] AMBIEN 10 MG TABS (ZOLPIDEM TARTRATE) 1 by mouth at bedtime  #90 x 0   Entered and Authorized by:   Loreen Freud DO   Signed by:   Loreen Freud DO on 12/12/2009   Method used:   Print then Give to Patient   RxID:   [id number] PRISTIQ 100 MG XR24H-TAB (DESVENLAFAXINE SUCCINATE) as directed   #90 x 3   Entered and Authorized by:   Loreen Freud DO   Signed by:   Loreen Freud DO on 12/12/2009   Method used:   Electronically to        [facility]* (mail-order)       [address]       [place], [place]  [address]       Ph: [phone]       Fax: [phone]   RxID:   [id number]

## 2010-06-06 NOTE — Progress Notes (Signed)
Summary: Refill Request  Phone Note Refill Request Call back at Home Phone [phone] Message from:  Patient on Oct 04, 2009 1:46 PM  Refills Requested: Medication #1:  OXYCODONE-ACETAMINOPHEN 5-325 MG  TABS 1-2  by mouth two times a day as needed   Dosage confirmed as above?Dosage Confirmed   Supply Requested: 1 month   Last Refilled: 08/11/2009  Method Requested: Pick up at Office Next Appointment Scheduled: 6.23.11 (lab) Initial call taken by: Harold Barban,  Oct 04, 2009 1:46 PM  Follow-up for Phone Call        last ov- 09/2009  Additional Follow-up for Phone Call Additional follow up Details #1::        ok to refill Additional Follow-up by: Loreen Freud DO,  Oct 04, 2009 2:06 PM    Additional Follow-up for Phone Call Additional follow up Details #2::    pt aware rx is ready. Army Fossa CMA  Oct 04, 2009 2:13 PM   Prescriptions: OXYCODONE-ACETAMINOPHEN 5-325 MG  TABS (OXYCODONE-ACETAMINOPHEN) 1-2  by mouth two times a day as needed  #90 x 0   Entered and Authorized by:   Loreen Freud DO   Signed by:   Loreen Freud DO on 10/04/2009   Method used:   Print then Give to Patient   RxID:   [id number]

## 2010-06-06 NOTE — Progress Notes (Signed)
Summary: Results  Phone Note Outgoing Call   Call placed by: Army Fossa CMA,  August 17, 2009 2:13 PM Summary of Call: Regarding results, LMTCB:  + fluid in R sinus over eye----  if pt still symptomatic ---treat with levaquin 500 mg 1 by mouth once daily for 10 days----- if symptoms cont then ---refer to neuro vs ENT Signed by Loreen Freud DO on 08/17/2009 at 2:11 PM   Follow-up for Phone Call        Left message on pts home and work number to call back. Army Fossa CMA  August 18, 2009 10:33 AM   Additional Follow-up for Phone Call Additional follow up Details #1::        Patient would like try the levaquin. She uses [facility] [employer]. She will give [place] a call back if the pressure still remains after the 10 days are up for a referral. Harold Barban  August 18, 2009 11:27 AM    New/Updated Medications: LEVAQUIN 500 MG TABS (LEVOFLOXACIN) 1 by mouth once daily for 10 days. Prescriptions: LEVAQUIN 500 MG TABS (LEVOFLOXACIN) 1 by mouth once daily for 10 days.  #10 x 0   Entered by:   Army Fossa CMA   Authorized by:   Loreen Freud DO   Signed by:   Army Fossa CMA on 08/18/2009   Method used:   Electronically to        [facility] [address] (retail)       [phone]       [place], [place]  [address]       Ph: [phone]       Fax: [phone]   RxID:   [id number]

## 2010-06-06 NOTE — Assessment & Plan Note (Signed)
Summary: remove mole//cbs   Vital Signs:  Patient profile:   69 year old female Weight:      178.13 pounds Temp:     98.1 degrees F oral Pulse rate:   76 / minute Pulse rhythm:   regular CC: Pt here for mole removal off scalp?    History of Present Illness: Pt here to remove moles x1 on scalp.    Allergies: 1)  ! Pcn 2)  ! * Flu Vaccination 3)  ! Biaxin 4)  ! Macrobid (Nitrofurantoin Monohyd Macro)   Impression & Recommendations:  Problem # 1:  SEBORRHEIC KERATOSIS, INFLAMED (ICD-702.11)  1 removed with shave biopsy and other with cryo  Orders: Shave Skin Lesion 0.6-1.0 cm/trunk/arm/leg (113[address]) Cryotherapy/Destruction benign or premalignant lesion (1st lesion)  ([address])  Complete Medication List: 1)  Soma 350 Mg Tabs (Carisoprodol) .Marland Kitchen.. 1 by mouth at bedtime as needed 2)  Oxycodone-acetaminophen 5-325 Mg Tabs (Oxycodone-acetaminophen) .Marland Kitchen.. 1-2  by mouth two times a day as needed 3)  Cartia Xt 240 Mg Cp24 (Diltiazem hcl coated beads) .Marland Kitchen.. 1 by mouth once daily 4)  Atenolol 50 Mg Tabs (Atenolol) .Marland Kitchen.. 1 by mouth two times a day 5)  Quinapril Hcl 40 Mg Tabs (Quinapril hcl) .Marland Kitchen.. 1 by mouth once daily 6)  Pristiq 100 Mg Xr24h-tab (Desvenlafaxine succinate) .... As directed 7)  Temazepam 15 Mg Caps (Temazepam) .Marland Kitchen.. 1 by mouth as needed 8)  Celebrex 200 Mg Caps (Celecoxib) .Marland Kitchen.. 1 by mouth once daily 9)  Ambien 10 Mg Tabs (Zolpidem tartrate) .Marland Kitchen.. 1 by mouth at bedtime 10)  Elocon 0.1 % Lotn (Mometasone furoate) .... Apply once daily 11)  Astelin 137 Mcg/spray Soln (Azelastine hcl) .... One spray each nostril at bedtime 12)  Ipratropium Bromide 0.06 % Soln (Ipratropium bromide) .Marland Kitchen.. 1-2 sprays each nostril up to three times a day as needed 13)  Pennsaid 1.5 % Soln (Diclofenac sodium) .... 40 gtts applied to each knee qid as needed  Procedure Note  Mole Biopsy/Removal: The patient complains of irritation and changing mole. Date of onset: 07/13/2009 Onset of lesion: 2  months Indication: inflamed lesion  Procedure # 1: shave biopsy    Size (in cm): 0.2 x 0.2    Location: scalp    Comment: silver nitrate used to stop bleeding    Instrument used: #10 blade    Anesthesia: 1% lidocaine w/epinephrine  Procedure # 2: cryo     Size (in cm): 0.5 x 0.2    Region: scalp    Location: scalp  Cleaned and prepped with: alcohol and betadine Wound dressing: bacitracin and bulky gauze dressing Instructions: daily dressing changes Additional Instructions: keep dry 24 hours

## 2010-06-06 NOTE — Progress Notes (Signed)
Summary: refill  Phone Note Refill Request Message from:  Patient on February 01, 2010 3:28 PM  Refills Requested: Medication #1:  OXYCODONE-ACETAMINOPHEN 5-325 MG  TABS 1-2  by mouth two times a day as needed patient will pick up [id number]  Initial call taken by: Okey Regal [place],  February 01, 2010 3:28 PM  Follow-up for Phone Call        Last seen and Rx filled 12/20/09 Please advise Follow-up by: Almeta Monas CMA Duncan Dull),  February 02, 2010 4:51 PM  Additional Follow-up for Phone Call Additional follow up Details #1::        ok to refill   Additional Follow-up by: Loreen Freud DO,  February 02, 2010 5:08 PM    Prescriptions: OXYCODONE-ACETAMINOPHEN 5-325 MG  TABS (OXYCODONE-ACETAMINOPHEN) 1-2  by mouth two times a day as needed  #90 x 0   Entered by:   Almeta Monas CMA (AAMA)   Authorized by:   Loreen Freud DO   Signed by:   Almeta Monas CMA (AAMA) on 02/03/2010   Method used:   Printed then faxed to ...       [facility] [address] (retail)       [phone]       [place], [place]  [address]       Ph: [phone]       Fax: [phone]   RxID:   [id number]  Rx left at check in for pt to pick up.    Almeta Monas CMA Duncan Dull)  February 03, 2010 8:06 AM

## 2010-06-06 NOTE — Progress Notes (Signed)
Summary: refill  Phone Note Refill Request Call back at Home Phone [phone] Message from:  Patient on November 08, 2009 3:41 PM  Refills Requested: Medication #1:  OXYCODONE-ACETAMINOPHEN 5-325 MG  TABS 1-2  by mouth two times a day as needed   Last Refilled: 10/04/2009 call when ready   Follow-up for Phone Call        last ov- 09/13/09. Army Fossa CMA  November 08, 2009 3:43 PM   Additional Follow-up for Phone Call Additional follow up Details #1::        ok to refill x1 Additional Follow-up by: Loreen Freud DO,  November 08, 2009 3:53 PM    Additional Follow-up for Phone Call Additional follow up Details #2::    Patient notified. Follow-up by: Lucious Groves,  November 08, 2009 5:11 PM  Prescriptions: OXYCODONE-ACETAMINOPHEN 5-325 MG  TABS (OXYCODONE-ACETAMINOPHEN) 1-2  by mouth two times a day as needed  #90 x 0   Entered and Authorized by:   Loreen Freud DO   Signed by:   Loreen Freud DO on 11/08/2009   Method used:   Print then Give to Patient   RxID:   [id number]

## 2010-06-06 NOTE — Progress Notes (Signed)
Summary: NEEDS NAME AND NUMBER OF FOOT DOCTOR  Phone Note Call from Patient Call back at Home Phone [phone]   Caller: PT Summary of Call: WHAT IS NAME AND NUMBER OF FOOT DOCTOR THAT DR Laury Axon RECOMMENDED TWO YEARS AGO---PATIENT SAYS IT IS "BECAUSE OF HER TOENAILS" Initial call taken by: Jerolyn Shin,  May 11, 2009 11:25 AM  Follow-up for Phone Call        dr lowne pls advise  I have review chart and do not see anything relating to toenail...........Marland KitchenFelecia Deloach CMA  May 11, 2009 12:22 PM   Additional Follow-up for Phone Call Additional follow up Details #1::        Dr Deirdre Priest or [facility] Additional Follow-up by: Loreen Freud DO,  May 11, 2009 12:30 PM    Additional Follow-up for Phone Call Additional follow up Details #2::    pt aware.  Follow-up by: Army Fossa CMA,  May 11, 2009 1:51 PM

## 2010-06-06 NOTE — Assessment & Plan Note (Signed)
Summary: refill meds/kdc   Vital Signs:  Patient profile:   70 year old female Weight:      186 pounds Temp:     98.0 degrees F oral Pulse rate:   70 / minute Pulse rhythm:   regular BP sitting:   138 / 78  (left arm) Cuff size:   large  Vitals Entered By: Army Fossa CMA (June 06, 2009 1:54 PM) CC: Refill meds- still having the ongoing problems with the "runny nose" when she is stressed.    History of Present Illness: Pt here for med refill. Pt only complaint is con't runny nose----none of the nasal sprays or antihistamines have helped.   Hypertension follow-up      This is a 70 year old woman who presents for Hypertension follow-up.  The patient denies lightheadedness, urinary frequency, headaches, edema, impotence, rash, and fatigue.  The patient denies the following associated symptoms: chest pain, chest pressure, exercise intolerance, dyspnea, palpitations, syncope, leg edema, and pedal edema.  Compliance with medications (by patient report) has been near 100%.  The patient reports that dietary compliance has been good.  Adjunctive measures currently used by the patient include salt restriction.    Current Medications (verified): 1)  Soma 350 Mg  Tabs (Carisoprodol) .Marland Kitchen.. 1 By Mouth At Bedtime As Needed 2)  Oxycodone-Acetaminophen 5-325 Mg  Tabs (Oxycodone-Acetaminophen) .Marland Kitchen.. 1-2  By Mouth Two Times A Day As Needed 3)  Cartia Xt 240 Mg  Cp24 (Diltiazem Hcl Coated Beads) .Marland Kitchen.. 1 By Mouth Once Daily 4)  Atenolol 50 Mg  Tabs (Atenolol) .... 1/2  By Mouth Two Times A Day 5)  Quinapril Hcl 40 Mg  Tabs (Quinapril Hcl) .Marland Kitchen.. 1 By Mouth Once Daily 6)  Pristiq 100 Mg Xr24h-Tab (Desvenlafaxine Succinate) .... As Directed 7)  Tamoxifen Citrate 20 Mg  Tabs (Tamoxifen Citrate) .Marland Kitchen.. 1 By Mouth Once Daily 8)  Temazepam 15 Mg  Caps (Temazepam) .Marland Kitchen.. 1 By Mouth As Needed 9)  Neurontin 300 Mg Caps (Gabapentin) .Marland Kitchen.. 1 By Mouth At Bedtime As Needed 10)  Ibuprofen 800 Mg Tabs (Ibuprofen) .Marland Kitchen.. 1  By Mouth Three Times A Day As Needed 11)  Pristiq 50 Mg Xr24h-Tab (Desvenlafaxine Succinate) .... Take One Tablet Daily. 12)  Ambien 10 Mg Tabs (Zolpidem Tartrate) .Marland Kitchen.. 1 By Mouth At Bedtime 13)  Xyzal 5 Mg Tabs (Levocetirizine Dihydrochloride) .Marland Kitchen.. 1 By Mouth Once Daily  Allergies: 1)  ! Pcn 2)  ! * Flu Vaccination 3)  ! Biaxin 4)  ! 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) SINUSITIS, ACUTE NOS (ICD-461.9) TMJ PAIN (ICD-524.62) FAMILY HISTORY OF ALCOHOLISM/ADDICTION (ICD-V61.41) FAMILY HISTORY OF CAD FEMALE 1ST DEGREE RELATIVE <50 (ICD-V17.3) OSTEOARTHRITIS (ICD-715.90) HYPERTENSION (ICD-401.9) IBS (ICD-564.1) THYROID NODULE (ICD-241.0) ARTHRITIS (ICD-716.90) HYPERTENSION (ICD-401.9)  Past Surgical History: Last updated: 02/27/2008 Thyroid Surgery-1960 Appendectomy Hernia Surgery Lumpectomy of Left breast x 2 Hysterectomy  Family History: Last updated: 02/27/2008 Family History of CAD Female 1st degree relative <50, father M died brain tumor and 27yo Family History Liver disease, uncle (liver cancer) Family History of Alcoholism/Addiction No FH of Colon Cancer:  Social History: Last updated: 06/07/2008 Patient has never smoked.  Alcohol Use -  no Daily Caffeine Use- 2 glasses daily Illicit Drug Use - no Patient does not get regular exercise.  Occupation: [employer] 1 day a week Never Smoked  Risk Factors: Exercise: no (02/27/2008)  Risk  Factors: Smoking Status: never (06/07/2008)  Family History: Reviewed history from 02/27/2008 and no changes required. Family History of CAD Female 1st degree relative <50, father M died brain tumor and 27yo Family History Liver disease, uncle (liver cancer) Family History of Alcoholism/Addiction No FH of Colon Cancer:  Social History: Reviewed history from 06/07/2008 and no changes required. Patient has never smoked.  Alcohol Use - no Daily Caffeine Use- 2 glasses daily Illicit Drug Use - no Patient does not get regular exercise.  Occupation: [employer] 1 day a week Never Smoked  Review of Systems      See HPI  Physical Exam  General:  Well-developed,well-nourished,in no acute distress; alert,appropriate and cooperative throughout examination Lungs:  Normal respiratory effort, chest expands symmetrically. Lungs are clear to auscultation, no crackles or wheezes. Heart:  normal rate and no murmur.   Extremities:  No clubbing, cyanosis, edema, or deformity noted with normal full range of motion of all joints.   Psych:  Cognition and judgment appear intact. Alert and cooperative with normal attention span and concentration. No apparent delusions, illusions, hallucinations   Impression & Recommendations:  Problem # 1:  HYPERTENSION (ICD-401.9)  Her updated medication list for this problem includes:    Cartia Xt 240 Mg Cp24 (Diltiazem hcl coated beads) .Marland Kitchen... 1 by mouth once daily    Atenolol 50 Mg Tabs (Atenolol) .Marland Kitchen... 1/2  by mouth two times a day    Quinapril Hcl 40 Mg Tabs (Quinapril hcl) .Marland Kitchen... 1 by mouth once daily  BP today: 138/78 Prior BP: 120/80 (03/09/2009)  Labs Reviewed: K+: 4.2 (06/18/2008) Creat: : 0.8 (06/18/2008)   Chol: 172 (06/18/2008)   HDL: 37.6 (06/18/2008)   LDL: 110 (06/18/2008)   TG: 122 (06/18/2008)  Problem # 2:  ALLERGIC RHINITIS CAUSE UNSPECIFIED (ICD-477.9)  Her updated medication list for this problem includes:    Xyzal 5 Mg Tabs (Levocetirizine  dihydrochloride) .Marland Kitchen... 1 by mouth once daily  Orders: Allergy Referral  (Allergy)  Discussed use of allergy medications and environmental measures.   Problem # 3:  OSTEOARTHRITIS (ICD-715.90)  Her updated medication list for this problem includes:    Oxycodone-acetaminophen 5-325 Mg Tabs (Oxycodone-acetaminophen) .Marland Kitchen... 1-2  by mouth two times a day as needed    Ibuprofen 800 Mg Tabs (Ibuprofen) .Marland Kitchen... 1 by mouth three times a day as needed  Discussed use of medications, application of heat or cold, and exercises.   Complete Medication List: 1)  Soma 350 Mg Tabs (Carisoprodol) .Marland Kitchen.. 1 by mouth at bedtime as needed 2)  Oxycodone-acetaminophen 5-325 Mg Tabs (Oxycodone-acetaminophen) .Marland Kitchen.. 1-2  by mouth two times a day as needed 3)  Cartia Xt 240 Mg Cp24 (Diltiazem hcl coated beads) .Marland Kitchen.. 1 by mouth once daily 4)  Atenolol 50 Mg Tabs (Atenolol) .... 1/2  by mouth two times a day 5)  Quinapril Hcl 40 Mg Tabs (Quinapril hcl) .Marland Kitchen.. 1 by mouth once daily 6)  Pristiq 100 Mg Xr24h-tab (Desvenlafaxine succinate) .... As directed 7)  Tamoxifen Citrate 20 Mg Tabs (Tamoxifen citrate) .Marland Kitchen.. 1 by mouth once daily 8)  Temazepam 15 Mg Caps (Temazepam) .Marland Kitchen.. 1 by mouth as needed 9)  Neurontin 300 Mg Caps (Gabapentin) .Marland Kitchen.. 1 by mouth at bedtime as needed 10)  Ibuprofen 800 Mg Tabs (Ibuprofen) .Marland Kitchen.. 1 by mouth  three times a day as needed 11)  Pristiq 50 Mg Xr24h-tab (Desvenlafaxine succinate) .... Take one tablet daily. 12)  Ambien 10 Mg Tabs (Zolpidem tartrate) .Marland Kitchen.. 1 by mouth at bedtime 13)  Xyzal 5 Mg Tabs (Levocetirizine dihydrochloride) .Marland Kitchen.. 1 by mouth once daily  Patient Instructions: 1)  fasting labs 401.9   bmp, hep, lipid Prescriptions: QUINAPRIL HCL 40 MG  TABS (QUINAPRIL HCL) 1 by mouth once daily  #90 x 3   Entered and Authorized by:   Loreen Freud DO   Signed by:   Loreen Freud DO on 06/06/2009   Method used:   Print then Give to Patient   RxID:   [id number] ATENOLOL 50 MG  TABS  (ATENOLOL) 1/2  by mouth two times a day  #90 x 3   Entered and Authorized by:   Loreen Freud DO   Signed by:   Loreen Freud DO on 06/06/2009   Method used:   Print then Give to Patient   RxID:   [id number] QUINAPRIL HCL 40 MG  TABS (QUINAPRIL HCL) 1 by mouth once daily  #90 x 3   Entered and Authorized by:   Loreen Freud DO   Signed by:   Loreen Freud DO on 06/06/2009   Method used:   Electronically to        [facility]* (mail-order)       [address]       [place], [place]  [address]       Ph: [phone]       Fax: [phone]   RxID:   [id number] CARTIA XT 240 MG  CP24 (DILTIAZEM HCL COATED BEADS) 1 by mouth once daily  #90 x 3   Entered and Authorized by:   Loreen Freud DO   Signed by:   Loreen Freud DO on 06/06/2009   Method used:   Electronically to        [facility]* (mail-order)       [address]       [place], [place]  [address]       Ph: [phone]       Fax: [phone]   RxID:   [id number] OXYCODONE-ACETAMINOPHEN 5-325 MG  TABS (OXYCODONE-ACETAMINOPHEN) 1-2  by mouth two times a day as needed  #90 x 0   Entered and Authorized by:   Loreen Freud DO   Signed by:   Loreen Freud DO on 06/06/2009   Method used:   Print then Give to Patient   RxID:   [id number]

## 2010-06-06 NOTE — Assessment & Plan Note (Signed)
Summary: uti/kn   Vital Signs:  Patient profile:   70 year old female Weight:      180 pounds Temp:     98.2 degrees F oral Pulse rate:   68 / minute BP sitting:   130 / 76  (right arm)  Vitals Entered By: Almeta Monas CMA Duncan Dull) (December 20, 2009 3:16 PM) CC: c/o dysuria, Dysuria   History of Present Illness:  Dysuria      This is a 70 year old woman who presents with Dysuria.  The symptoms began 1 day ago.  Pt with hx UTIs but last one was 2 years ago.  The patient complains of burning with urination and urinary frequency, but denies urgency, hematuria, vaginal discharge, vaginal itching, vaginal sores, and penile discharge.  Associated symptoms include shaking chills.  The patient denies the following associated symptoms: nausea, vomiting, fever, flank pain, abdominal pain, back pain, pelvic pain, and arthralgias.    Current Medications (verified): 1)  Soma 350 Mg  Tabs (Carisoprodol) .Marland Kitchen.. 1 By Mouth At Bedtime As Needed 2)  Oxycodone-Acetaminophen 5-325 Mg  Tabs (Oxycodone-Acetaminophen) .Marland Kitchen.. 1-2  By Mouth Two Times A Day As Needed 3)  Cartia Xt 240 Mg  Cp24 (Diltiazem Hcl Coated Beads) .Marland Kitchen.. 1 By Mouth Once Daily 4)  Atenolol 50 Mg  Tabs (Atenolol) .Marland Kitchen.. 1 By Mouth Two Times A Day 5)  Quinapril Hcl 40 Mg  Tabs (Quinapril Hcl) .Marland Kitchen.. 1 By Mouth Once Daily 6)  Pristiq 100 Mg Xr24h-Tab (Desvenlafaxine Succinate) .... As Directed 7)  Temazepam 15 Mg  Caps (Temazepam) .Marland Kitchen.. 1 By Mouth As Needed 8)  Celebrex 200 Mg Caps (Celecoxib) .Marland Kitchen.. 1 By Mouth Once Daily 9)  Ambien 10 Mg Tabs (Zolpidem Tartrate) .Marland Kitchen.. 1 By Mouth At Bedtime 10)  Astelin 137 Mcg/spray Soln (Azelastine Hcl) .... One Spray Each Nostril At Bedtime 11)  Ipratropium Bromide 0.06 % Soln (Ipratropium Bromide) .Marland Kitchen.. 1-2 Sprays Each Nostril Up To Three Times A Day As Needed 12)  Pennsaid 1.5 % Soln (Diclofenac Sodium) .... 40 Gtts Applied To Each Knee Qid As Needed 13)  Ibuprofen 800 Mg Tabs (Ibuprofen) .Marland Kitchen.. 1 By Mouth Every 6  Hours 14)  Macrobid 100 Mg Caps (Nitrofurantoin Monohyd Macro) .Marland Kitchen.. 1 By Mouth Two Times A Day  Allergies (verified): 1)  ! Pcn 2)  ! * Flu Vaccination 3)  ! Biaxin  Past History:  Past medical, surgical, family and social histories (including risk factors) reviewed for relevance to current acute and chronic problems.  Past Medical History: Reviewed history from 12/25/2007 and no changes required. 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 Thyroid Surgery-1960 Appendectomy Hernia Surgery Lumpectomy of Left breast x 2 Hysterectomy  Family History: Reviewed history from 02/27/2008 and no changes required. Family History of CAD Female 1st degree relative <50, father M died brain tumor and 27yo Family History Liver disease, uncle (liver cancer) Family History of Alcoholism/Addiction No FH of Colon Cancer:  Social History: Reviewed history  from 09/02/2009 and no changes required. Patient has never smoked.  Alcohol Use - no Daily Caffeine Use- 2 glasses daily Illicit Drug Use - no Patient does not get regular exercise.  Occupation: [employer] 1 day a week (Retired  otherwise) Never Smoked Divorced lives with son  Review of Systems      See HPI  Physical Exam  General:  Well-developed,well-nourished,in no acute distress; alert,appropriate and cooperative throughout examination Psych:  Cognition and judgment appear intact. Alert and cooperative with normal attention span and concentration. No apparent delusions, illusions, hallucinations   Impression & Recommendations:  Problem # 1:  UTI (ICD-599.0)  Her updated medication list for this problem includes:    Macrobid 100 Mg Caps (Nitrofurantoin monohyd macro) .Marland Kitchen... 1 by mouth two times a day  Encouraged to push clear liquids, get enough rest, and take acetaminophen as needed. To be seen in 10 days if no improvement, sooner if worse.  Orders: T-Culture, Urine ([id number]) UA Dipstick w/o Micro (manual) (28413)  Complete Medication List: 1)  Soma 350 Mg Tabs (Carisoprodol) .Marland Kitchen.. 1 by mouth at bedtime as needed 2)  Oxycodone-acetaminophen 5-325 Mg Tabs (Oxycodone-acetaminophen) .Marland Kitchen.. 1-2  by mouth two times a day as needed 3)  Cartia Xt 240 Mg Cp24 (Diltiazem hcl coated beads) .Marland Kitchen.. 1 by mouth once daily 4)  Atenolol 50 Mg Tabs (Atenolol) .Marland Kitchen.. 1 by mouth two times a day 5)  Quinapril Hcl 40 Mg Tabs (Quinapril hcl) .Marland Kitchen.. 1 by mouth once daily 6)  Pristiq 100 Mg Xr24h-tab (Desvenlafaxine succinate) .... As directed 7)  Temazepam 15 Mg Caps (Temazepam) .Marland Kitchen.. 1 by mouth as needed 8)  Celebrex 200 Mg Caps (Celecoxib) .Marland Kitchen.. 1 by mouth once daily 9)  Ambien 10 Mg Tabs (Zolpidem tartrate) .Marland Kitchen.. 1 by mouth at bedtime 10)  Astelin 137 Mcg/spray Soln (Azelastine hcl) .... One spray each nostril at bedtime 11)  Ipratropium Bromide 0.06 % Soln (Ipratropium bromide) .Marland Kitchen.. 1-2 sprays each nostril up to three times a day as needed 12)  Pennsaid 1.5 % Soln (Diclofenac sodium) .... 40 gtts applied to each knee qid as needed 13)  Ibuprofen 800 Mg Tabs (Ibuprofen) .Marland Kitchen.. 1 by mouth every 6 hours 14)  Macrobid 100 Mg Caps  (Nitrofurantoin monohyd macro) .Marland Kitchen.. 1 by mouth two times a day Prescriptions: OXYCODONE-ACETAMINOPHEN 5-325 MG  TABS (OXYCODONE-ACETAMINOPHEN) 1-2  by mouth two times a day as needed  #90 x 0   Entered and Authorized by:   Loreen Freud DO   Signed by:   Loreen Freud DO on 12/20/2009   Method used:   Print then Give to Patient   RxID:   [id number] MACROBID 100 MG CAPS (NITROFURANTOIN MONOHYD MACRO) 1 by mouth two times a day  #14 x 0   Entered and Authorized by:   Loreen Freud DO   Signed by:   Loreen Freud DO on 12/20/2009   Method used:   Electronically to        [facility] [address] (retail)       [phone]       [place], [place]  [address]       Ph: [phone]       Fax: [phone]   RxID:   [id number]   Laboratory Results   Urine Tests    Routine Urinalysis   Color: yellow Appearance: Hazy Glucose: negative   (Normal Range: Negative) Bilirubin: negative   (Normal Range: Negative) Ketone: negative   (  Normal Range: Negative) Spec. Gravity: 1.015   (Normal Range: 1.003-1.035) Blood: negative   (Normal Range: Negative) pH: 6.5   (Normal Range: 5.0-8.0) Protein: negative   (Normal Range: Negative) Urobilinogen: 0.2   (Normal Range: 0-1) Nitrite: negative   (Normal Range: Negative) Leukocyte Esterace: small   (Normal Range: Negative)    Comments: Almeta Monas CMA (AAMA)  December 20, 2009 3:17 PM

## 2010-06-06 NOTE — Assessment & Plan Note (Signed)
Summary: bitten/stung on neck//swollen and itchy//lch   Vital Signs:  Patient profile:   70 year old female Height:      69 inches Weight:      177 pounds BMI:     26.23 Temp:     98.1 degrees F oral Pulse rate:   84 / minute Pulse rhythm:   regular BP sitting:   126 / 82  (left arm) Cuff size:   large  Vitals Entered By: Army Fossa CMA (September 01, 2009 11:09 AM) CC: Pt here has "welp" on neck unsure if she was bitten or stung. Itches and burns. Onset yesterday afternoon.    History of Present Illness: Pt here c/o welp on neck since yesterday.  It itches and burns  no other complaints.   No otc meds.  Pt is unsure it it is a bite or not.  Current Medications (verified): 1)  Soma 350 Mg  Tabs (Carisoprodol) .Marland Kitchen.. 1 By Mouth At Bedtime As Needed 2)  Oxycodone-Acetaminophen 5-325 Mg  Tabs (Oxycodone-Acetaminophen) .Marland Kitchen.. 1-2  By Mouth Two Times A Day As Needed 3)  Cartia Xt 240 Mg  Cp24 (Diltiazem Hcl Coated Beads) .Marland Kitchen.. 1 By Mouth Once Daily 4)  Atenolol 50 Mg  Tabs (Atenolol) .Marland Kitchen.. 1 By Mouth Two Times A Day 5)  Quinapril Hcl 40 Mg  Tabs (Quinapril Hcl) .Marland Kitchen.. 1 By Mouth Once Daily 6)  Pristiq 100 Mg Xr24h-Tab (Desvenlafaxine Succinate) .... As Directed 7)  Temazepam 15 Mg  Caps (Temazepam) .Marland Kitchen.. 1 By Mouth As Needed 8)  Ibuprofen 800 Mg Tabs (Ibuprofen) .Marland Kitchen.. 1 By Mouth Three Times A Day As Needed 9)  Ambien 10 Mg Tabs (Zolpidem Tartrate) .Marland Kitchen.. 1 By Mouth At Bedtime 10)  Xyzal 5 Mg Tabs (Levocetirizine Dihydrochloride) .Marland Kitchen.. 1 By Mouth Once Daily 11)  Alprazolam 0.25 Mg Tabs (Alprazolam) .... As Directed For Procedure 12)  Levaquin 500 Mg Tabs (Levofloxacin) .Marland Kitchen.. 1 By Mouth Once Daily 13)  Elocon 0.1 % Lotn (Mometasone Furoate) .... Apply Once Daily  Allergies: 1)  ! Pcn 2)  ! * Flu Vaccination 3)  ! Biaxin 4)  ! Macrobid (Nitrofurantoin Educational psychologist)  Past History:  Past medical, surgical, family and social histories (including risk factors) reviewed for relevance to current  acute and chronic problems.  Past Medical History: Reviewed history from 12/25/2007 and no changes required. 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 Thyroid Surgery-1960 Appendectomy Hernia Surgery Lumpectomy of Left breast x 2 Hysterectomy  Family History: Reviewed history from 02/27/2008 and no changes required. Family History of CAD Female 1st degree relative <50, father M died brain tumor and 27yo Family History Liver disease, uncle (liver cancer) Family History of Alcoholism/Addiction No FH of Colon Cancer:  Social History: Reviewed history from 06/07/2008 and no changes required. Patient has never smoked.  Alcohol Use - no Daily Caffeine Use- 2 glasses daily Illicit Drug Use - no Patient does not get regular exercise.  Occupation: [employer] 1 day a  week Never Smoked  Review of Systems      See HPI  Physical Exam  General:  Well-developed,well-nourished,in no acute distress; alert,appropriate and cooperative throughout examination Skin:  + errythematous circular area L side neck about 1 in diameter--with single bite/sting mark in center warm to touch no  drainage No fb  Cervical Nodes:  No lymphadenopathy noted Psych:  Oriented X3 and normally interactive.     Impression & Recommendations:  Problem # 1:  INSECT BITE (ICD-919.4) antiobiotic and elocon cream  sent to pharmacy benadryl for itching rto if no better 3-4 days or if worsens  Complete Medication List: 1)  Soma 350 Mg Tabs (Carisoprodol) .Marland Kitchen.. 1 by mouth at bedtime as needed 2)  Oxycodone-acetaminophen 5-325 Mg Tabs (Oxycodone-acetaminophen) .Marland Kitchen.. 1-2  by mouth two times a day as needed 3)  Cartia Xt 240 Mg Cp24 (Diltiazem hcl coated beads) .Marland Kitchen.. 1 by mouth once daily 4)  Atenolol 50 Mg Tabs (Atenolol) .Marland Kitchen.. 1 by mouth two times a day 5)  Quinapril Hcl 40 Mg Tabs (Quinapril hcl) .Marland Kitchen.. 1 by mouth once daily 6)  Pristiq 100 Mg Xr24h-tab (Desvenlafaxine succinate) .... As directed 7)  Temazepam 15 Mg Caps (Temazepam) .Marland Kitchen.. 1 by mouth as needed 8)  Ibuprofen 800 Mg Tabs (Ibuprofen) .Marland Kitchen.. 1 by mouth three times a day as needed 9)  Ambien 10 Mg Tabs (Zolpidem tartrate) .Marland Kitchen.. 1 by mouth at bedtime 10)  Xyzal 5 Mg Tabs (Levocetirizine dihydrochloride) .Marland Kitchen.. 1 by mouth once daily 11)  Alprazolam 0.25 Mg Tabs (Alprazolam) .... As directed for procedure 12)  Levaquin 500 Mg Tabs (Levofloxacin) .Marland Kitchen.. 1 by mouth once daily 13)  Elocon 0.1 % Lotn (Mometasone furoate) .... Apply once daily Prescriptions: ELOCON 0.1 % LOTN (MOMETASONE FUROATE) apply once daily  #30 g x 0   Entered and Authorized by:   Loreen Freud DO   Signed by:   Loreen Freud DO on 09/01/2009   Method used:   Electronically to        [facility] [address] (retail)       [phone]       [place], [place]  [address]       Ph: [phone]       Fax: [phone]   RxID:   [id number] LEVAQUIN 500 MG TABS (LEVOFLOXACIN) 1 by mouth once daily  #10 x 0   Entered and Authorized by:   Loreen Freud DO   Signed by:   Loreen Freud DO on 09/01/2009   Method used:   Electronically to         [facility] [address] (retail)       [phone]       [place], [place]  [address]       Ph: [phone]       Fax: [phone]   RxID:   [id number]

## 2010-06-08 ENCOUNTER — Encounter: Payer: Self-pay | Admitting: Family Medicine

## 2010-06-08 ENCOUNTER — Ambulatory Visit (INDEPENDENT_AMBULATORY_CARE_PROVIDER_SITE_OTHER): Payer: Medicare Other | Admitting: Family Medicine

## 2010-06-08 DIAGNOSIS — M1710 Unilateral primary osteoarthritis, unspecified knee: Secondary | ICD-10-CM

## 2010-06-08 DIAGNOSIS — E785 Hyperlipidemia, unspecified: Secondary | ICD-10-CM

## 2010-06-08 DIAGNOSIS — IMO0002 Reserved for concepts with insufficient information to code with codable children: Secondary | ICD-10-CM

## 2010-06-08 NOTE — Assessment & Plan Note (Signed)
Summary: FOLLOW UP ON MEDS/KN   Vital Signs:  Patient profile:   70 year old female Weight:      183.2 pounds Pulse rate:   64 / minute Pulse rhythm:   regular BP sitting:   118 / 72  (left arm) Cuff size:   regular  Vitals Entered By: Almeta Monas CMA Duncan Dull) (April 26, 2010 11:51 AM) CC: med refill   Current Medications (verified): 1)  Soma 350 Mg  Tabs (Carisoprodol) .Marland Kitchen.. 1 By Mouth At Bedtime As Needed 2)  Oxycodone-Acetaminophen 5-325 Mg  Tabs (Oxycodone-Acetaminophen) .Marland Kitchen.. 1-2  By Mouth Two Times A Day As Needed 3)  Cartia Xt 240 Mg  Cp24 (Diltiazem Hcl Coated Beads) .Marland Kitchen.. 1 By Mouth Once Daily 4)  Atenolol 50 Mg  Tabs (Atenolol) .Marland Kitchen.. 1 By Mouth Two Times A Day 5)  Quinapril Hcl 40 Mg  Tabs (Quinapril Hcl) .Marland Kitchen.. 1 By Mouth Once Daily 6)  Pristiq 100 Mg Xr24h-Tab (Desvenlafaxine Succinate) .... As Directed 7)  Temazepam 15 Mg  Caps (Temazepam) .Marland Kitchen.. 1 By Mouth As Needed 8)  Celebrex 200 Mg Caps (Celecoxib) .Marland Kitchen.. 1 By Mouth Once Daily 9)  Ambien 10 Mg Tabs (Zolpidem Tartrate) .Marland Kitchen.. 1 By Mouth At Bedtime 10)  Astelin 137 Mcg/spray Soln (Azelastine Hcl) .... One Spray Each Nostril At Bedtime 11)  Ipratropium Bromide 0.06 % Soln (Ipratropium Bromide) .Marland Kitchen.. 1-2 Sprays Each Nostril Up To Three Times A Day As Needed 12)  Pennsaid 1.5 % Soln (Diclofenac Sodium) .... 40 Gtts Applied To Each Knee Qid As Needed 13)  Ibuprofen 800 Mg Tabs (Ibuprofen) .Marland Kitchen.. 1 By Mouth Every 6 Hours  Allergies (verified): 1)  ! Pcn 2)  ! * Flu Vaccination 3)  ! 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) SINUSITIS, ACUTE NOS (ICD-461.9) TMJ PAIN (ICD-524.62) FAMILY HISTORY OF ALCOHOLISM/ADDICTION (ICD-V61.41) FAMILY HISTORY OF CAD FEMALE 1ST DEGREE RELATIVE <50 (ICD-V17.3) OSTEOARTHRITIS (ICD-715.90) HYPERTENSION (ICD-401.9) IBS (ICD-564.1) THYROID NODULE (ICD-241.0) ARTHRITIS (ICD-716.90) HYPERTENSION (ICD-401.9)  Past Surgical History: Last updated: 02/27/2008 Thyroid Surgery-1960 Appendectomy Hernia Surgery Lumpectomy of Left breast x 2 Hysterectomy  Family History: Last updated: 02/27/2008 Family History of CAD Female 1st degree relative <50, father M died brain tumor and 27yo Family History Liver disease, uncle (liver cancer) Family History of Alcoholism/Addiction No FH of Colon Cancer:  Social History: Last updated: 09/02/2009 Patient has never smoked.  Alcohol Use - no Daily Caffeine Use- 2 glasses daily Illicit Drug Use - no Patient does not get regular exercise.  Occupation: [employer] 1 day a week (Retired otherwise) Never Smoked Divorced lives with son  Risk Factors: Exercise: no (02/27/2008)  Risk Factors: Smoking Status: never (06/07/2008)  Family History: Reviewed history from 02/27/2008 and no changes required. Family History of CAD Female 1st degree relative <50, father M died brain tumor and 27yo Family History Liver disease, uncle (liver cancer) Family History of Alcoholism/Addiction No FH of Colon Cancer:  Social History: Reviewed history from 09/02/2009 and no changes required. Patient has never smoked.  Alcohol Use - no Daily Caffeine Use- 2 glasses daily Illicit Drug Use - no Patient does not get regular exercise.  Occupation: [employer] 1 day a week (Retired otherwise)  Never Smoked Divorced lives with son  Review of Systems      See HPI  Physical Exam  General:  Well-developed,well-nourished,in no acute distress; alert,appropriate and cooperative  throughout examination Lungs:  Normal respiratory effort, chest expands symmetrically. Lungs are clear to auscultation, no crackles or wheezes. Heart:  normal rate and no murmur.   Extremities:  No clubbing, cyanosis, edema, or deformity noted with normal full range of motion of all joints.   Psych:  Oriented X3 and normally interactive.     Impression & Recommendations:  Problem # 1:  HYPERTENSION (ICD-401.9)  Her updated medication list for this problem includes:    Cartia Xt 240 Mg Cp24 (Diltiazem hcl coated beads) .Marland Kitchen... 1 by mouth once daily    Atenolol 50 Mg Tabs (Atenolol) .Marland Kitchen... 1 by mouth two times a day    Quinapril Hcl 40 Mg Tabs (Quinapril hcl) .Marland Kitchen... 1 by mouth once daily  BP today: 118/72 Prior BP: 130/76 (12/20/2009)  Labs Reviewed: K+: 3.8 (07/20/2009) Creat: : 0.7 (07/20/2009)   Chol: 188 (07/20/2009)   HDL: 44.90 (07/20/2009)   LDL: 113 (07/20/2009)   TG: 150.0 (07/20/2009)  Orders: Prescription Created Electronically [phone])  Problem # 2:  OSTEOARTHRITIS, KNEES, BILATERAL (ICD-715.96)  Her updated medication list for this problem includes:    Soma 350 Mg Tabs (Carisoprodol) .Marland Kitchen... 1 by mouth at bedtime as needed    Oxycodone-acetaminophen 5-325 Mg Tabs (Oxycodone-acetaminophen) .Marland Kitchen... 1-2  by mouth two times a day as needed    Celebrex 200 Mg Caps (Celecoxib) .Marland Kitchen... 1 by mouth once daily    Ibuprofen 800 Mg Tabs (Ibuprofen) .Marland Kitchen... 1 by mouth every 6 hours  Discussed strengthening exercises, use of ice or heat, and medications.   Orders: Prescription Created Electronically [phone])  Problem # 3:  UNSPECIFIED VITAMIN D DEFICIENCY (ICD-268.9)  Orders: Prescription Created Electronically [phone])  Complete Medication List: 1)  Soma 350 Mg Tabs (Carisoprodol) .Marland Kitchen.. 1 by mouth at bedtime as needed 2)  Oxycodone-acetaminophen 5-325 Mg Tabs (Oxycodone-acetaminophen) .Marland Kitchen.. 1-2  by mouth two times a day as needed 3)  Cartia Xt 240 Mg Cp24 (Diltiazem hcl coated beads) .Marland Kitchen..  1 by mouth once daily 4)  Atenolol 50 Mg Tabs (Atenolol) .Marland Kitchen.. 1 by mouth two times a day 5)  Quinapril Hcl 40 Mg Tabs (Quinapril hcl) .Marland Kitchen.. 1 by mouth once daily 6)  Pristiq 100 Mg Xr24h-tab (Desvenlafaxine succinate) .... As directed 7)  Temazepam 15 Mg Caps (Temazepam) .Marland Kitchen.. 1 by mouth as needed 8)  Celebrex 200 Mg Caps (Celecoxib) .Marland Kitchen.. 1 by mouth once daily 9)  Ambien 10 Mg Tabs (Zolpidem tartrate) .Marland Kitchen.. 1 by mouth at bedtime 10)  Astepro 0.15 % Soln (Azelastine hcl) .... 2 sprays each nostril once daily 11)  Ipratropium Bromide 0.06 % Soln (Ipratropium bromide) .Marland Kitchen.. 1-2 sprays each nostril up to three times a day as needed 12)  Pennsaid 1.5 % Soln (Diclofenac sodium) .... 40 gtts applied to each knee qid as needed 13)  Ibuprofen 800 Mg Tabs (Ibuprofen) .Marland Kitchen.. 1 by mouth every 6 hours  Patient Instructions: 1)  Please schedule a follow-up appointment in 6 months .  2)  fasting labs  272.4  lipid , hep, bmp, vita  3)  D 3  ----- vita D deficiency Prescriptions: AMBIEN 10 MG TABS (ZOLPIDEM TARTRATE) 1 by mouth at bedtime  #90 x 0   Entered and Authorized by:   Loreen Freud DO   Signed by:   Loreen Freud DO on 04/26/2010  Method used:   Print then Give to Patient   RxID:   [id number] QUINAPRIL HCL 40 MG  TABS (QUINAPRIL HCL) 1 by mouth once daily  #90 x 3   Entered and Authorized by:   Loreen Freud DO   Signed by:   Loreen Freud DO on 04/26/2010   Method used:   Print then Give to Patient   RxID:   [id number] ATENOLOL 50 MG  TABS (ATENOLOL) 1 by mouth two times a day  #180 x 3   Entered and Authorized by:   Loreen Freud DO   Signed by:   Loreen Freud DO on 04/26/2010   Method used:   Print then Give to Patient   RxID:   [id number] CARTIA XT 240 MG  CP24 (DILTIAZEM HCL COATED BEADS) 1 by mouth once daily  #90 x 3   Entered and Authorized by:   Loreen Freud DO   Signed by:   Loreen Freud DO on 04/26/2010   Method used:   Electronically to        [facility] (mail-order)       [address]       [place], [place]  [address]       Ph: [phone]       Fax: [phone]   RxID:   [id number]    Orders Added: 1)  Est. Patient Level III [63016] 2)  Prescription Created Electronically [phone]

## 2010-06-08 NOTE — Progress Notes (Signed)
Summary: Oxycodone refill  Phone Note Refill Request Call back at Home Phone [phone] Message from:  Patient on May 29, 2010 1:51 PM  Refills Requested: Medication #1:  OXYCODONE-ACETAMINOPHEN 5-325 MG  TABS 1-2  by mouth two times a day as needed patient would like to pick up this prescription tomorrow  Next Appointment Scheduled: none Initial call taken by: Jerolyn Shin,  May 29, 2010 1:52 PM  Follow-up for Phone Call        last seen 12/21 and filled 12/15.Marland KitchenMarland Kitchenplease advise Follow-up by: Almeta Monas CMA Duncan Dull),  May 29, 2010 5:02 PM  Additional Follow-up for Phone Call Additional follow up Details #1::        refill x1 Additional Follow-up by: Loreen Freud DO,  May 29, 2010 9:40 PM    Prescriptions: OXYCODONE-ACETAMINOPHEN 5-325 MG  TABS (OXYCODONE-ACETAMINOPHEN) 1-2  by mouth two times a day as needed  #90 x 0   Entered by:   Almeta Monas CMA (AAMA)   Authorized by:   Loreen Freud DO   Signed by:   Almeta Monas CMA (AAMA) on 05/30/2010   Method used:   Print then Give to Patient   RxID:   [id number]

## 2010-06-08 NOTE — Progress Notes (Signed)
Summary: REFILL  Phone Note Refill Request Call back at Home Phone [phone] Message from:  Patient on April 19, 2010 3:18 PM  Refills Requested: Medication #1:  OXYCODONE-ACETAMINOPHEN 5-325 MG  TABS 1-2  by mouth two times a day as needed   Dosage confirmed as above?Dosage Confirmed   Supply Requested: 1 month  Method Requested: Pick up at Office Next Appointment Scheduled: 04/26/10 Initial call taken by: Lavell Islam,  April 19, 2010 3:19 PM  Follow-up for Phone Call        last seen 12/20/09 and filled 03/06/10.Marland KitchenMarland Kitchenplease advise Follow-up by: Almeta Monas CMA Duncan Dull),  April 20, 2010 8:20 AM  Additional Follow-up for Phone Call Additional follow up Details #1::        refill x1 Additional Follow-up by: Loreen Freud DO,  April 20, 2010 9:11 AM    Additional Follow-up for Phone Call Additional follow up Details #2::    pt aware Rx ready for pickup... Almeta Monas CMA Duncan Dull)  April 20, 2010 9:25 AM   Prescriptions: OXYCODONE-ACETAMINOPHEN 5-325 MG  TABS (OXYCODONE-ACETAMINOPHEN) 1-2  by mouth two times a day as needed  #90 x 0   Entered by:   Almeta Monas CMA (AAMA)   Authorized by:   Loreen Freud DO   Signed by:   Almeta Monas CMA (AAMA) on 04/20/2010   Method used:   Printed then faxed to ...       [facility] [address] (retail)       [phone]       [place], [place]  [address]       Ph: [phone]       Fax: [phone]   RxID:   [id number]

## 2010-06-14 NOTE — Assessment & Plan Note (Signed)
Summary: Review Labs   Vital Signs:  Patient profile:   70 year old female Height:      69 inches Weight:      181.2 pounds BMI:     26.86 Pulse rate:   64 / minute Pulse rhythm:   regular BP sitting:   124 / 76  (right arm) Cuff size:   regular  Vitals Entered By: Almeta Monas CMA Duncan Dull) (June 08, 2010 3:08 PM) CC: Wants to review labs   History of Present Illness: Pt here to review labs.  no complaints  Current Medications (verified): 1)  Soma 350 Mg  Tabs (Carisoprodol) .Marland Kitchen.. 1 By Mouth At Bedtime As Needed 2)  Oxycodone-Acetaminophen 5-325 Mg  Tabs (Oxycodone-Acetaminophen) .Marland Kitchen.. 1-2  By Mouth Two Times A Day As Needed 3)  Cartia Xt 240 Mg  Cp24 (Diltiazem Hcl Coated Beads) .Marland Kitchen.. 1 By Mouth Once Daily 4)  Atenolol 50 Mg  Tabs (Atenolol) .Marland Kitchen.. 1 By Mouth Two Times A Day 5)  Quinapril Hcl 40 Mg  Tabs (Quinapril Hcl) .Marland Kitchen.. 1 By Mouth Once Daily 6)  Pristiq 100 Mg Xr24h-Tab (Desvenlafaxine Succinate) .... As Directed 7)  Temazepam 15 Mg  Caps (Temazepam) .Marland Kitchen.. 1 By Mouth As Needed 8)  Celebrex 200 Mg Caps (Celecoxib) .Marland Kitchen.. 1 By Mouth Once Daily 9)  Ambien 10 Mg Tabs (Zolpidem Tartrate) .Marland Kitchen.. 1 By Mouth At Bedtime 10)  Astepro 0.15 % Soln (Azelastine Hcl) .... 2 Sprays Each Nostril Once Daily 11)  Ipratropium Bromide 0.06 % Soln (Ipratropium Bromide) .Marland Kitchen.. 1-2 Sprays Each Nostril Up To Three Times A Day As Needed 12)  Pennsaid 1.5 % Soln (Diclofenac Sodium) .... 40 Gtts Applied To Each Knee Qid As Needed 13)  Ibuprofen 800 Mg Tabs (Ibuprofen) .Marland Kitchen.. 1 By Mouth Every 6 Hours 14)  Flax Seed Oil 1000 Mg Caps (Flaxseed (Linseed)) .... 2 By Mouth Once Daily 15)  Fish Oil 1000 Mg Caps (Omega-3 Fatty Acids) .... 2 By Mouth Once Daily  Allergies (verified): 1)  ! Pcn 2)  ! * Flu Vaccination 3)  ! 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) SINUSITIS, ACUTE NOS (ICD-461.9) TMJ PAIN (ICD-524.62) FAMILY HISTORY OF ALCOHOLISM/ADDICTION (ICD-V61.41) FAMILY HISTORY OF CAD FEMALE 1ST DEGREE RELATIVE <50 (ICD-V17.3) OSTEOARTHRITIS (ICD-715.90) HYPERTENSION (ICD-401.9) IBS (ICD-564.1) THYROID NODULE (ICD-241.0) ARTHRITIS (ICD-716.90) HYPERTENSION (ICD-401.9)  Past Surgical History: Last updated: 02/27/2008 Thyroid Surgery-1960 Appendectomy Hernia Surgery Lumpectomy of Left breast x 2 Hysterectomy  Family History: Last updated: 02/27/2008 Family History of CAD Female 1st degree relative <50, father M died brain tumor and 27yo Family History Liver disease, uncle (liver cancer) Family History of Alcoholism/Addiction No FH of Colon Cancer:  Social History: Last updated: 09/02/2009 Patient has never smoked.  Alcohol Use - no Daily Caffeine Use- 2 glasses daily Illicit Drug Use - no Patient does not get regular exercise.  Occupation: [employer] 1 day a week (Retired otherwise) Never Smoked Divorced lives with son  Risk Factors: Exercise: no (02/27/2008)  Risk Factors: Smoking Status: never (06/07/2008)  Physical Exam  General:  Well-developed,well-nourished,in no acute distress; alert,appropriate and cooperative throughout examination Psych:  Cognition and judgment appear intact. Alert and cooperative with normal attention span  and concentration. No apparent delusions, illusions, hallucinations   Impression & Recommendations:  Problem # 1:  OSTEOARTHRITIS, KNEES, BILATERAL (ICD-715.96)  Her updated medication list for this problem includes:    Soma 350 Mg Tabs  (Carisoprodol) .Marland Kitchen... 1 by mouth at bedtime as needed    Oxycodone-acetaminophen 5-325 Mg Tabs (Oxycodone-acetaminophen) .Marland Kitchen... 1-2  by mouth two times a day as needed    Celebrex 200 Mg Caps (Celecoxib) .Marland Kitchen... 1 by mouth once daily    Ibuprofen 800 Mg Tabs (Ibuprofen) .Marland Kitchen... 1 by mouth every 6 hours  Discussed strengthening exercises, use of ice or heat, and medications.   Problem # 2:  HYPERLIPIDEMIA (ICD-272.4)  Labs Reviewed: SGOT: 18 (05/24/2010)   SGPT: 13 (05/24/2010)   HDL:48.00 (05/24/2010), 44.90 (07/20/2009)  LDL:113 (07/20/2009), 110 (06/18/2008)  Chol:257 (05/24/2010), 188 (07/20/2009)  Trig:165.0 (05/24/2010), 150.0 (07/20/2009)  Complete Medication List: 1)  Soma 350 Mg Tabs (Carisoprodol) .Marland Kitchen.. 1 by mouth at bedtime as needed 2)  Oxycodone-acetaminophen 5-325 Mg Tabs (Oxycodone-acetaminophen) .Marland Kitchen.. 1-2  by mouth two times a day as needed 3)  Cartia Xt 240 Mg Cp24 (Diltiazem hcl coated beads) .Marland Kitchen.. 1 by mouth once daily 4)  Atenolol 50 Mg Tabs (Atenolol) .Marland Kitchen.. 1 by mouth two times a day 5)  Quinapril Hcl 40 Mg Tabs (Quinapril hcl) .Marland Kitchen.. 1 by mouth once daily 6)  Pristiq 100 Mg Xr24h-tab (Desvenlafaxine succinate) .... As directed 7)  Temazepam 15 Mg Caps (Temazepam) .Marland Kitchen.. 1 by mouth as needed 8)  Celebrex 200 Mg Caps (Celecoxib) .Marland Kitchen.. 1 by mouth once daily 9)  Ambien 10 Mg Tabs (Zolpidem tartrate) .Marland Kitchen.. 1 by mouth at bedtime 10)  Astepro 0.15 % Soln (Azelastine hcl) .... 2 sprays each nostril once daily 11)  Ipratropium Bromide 0.06 % Soln (Ipratropium bromide) .Marland Kitchen.. 1-2 sprays each nostril up to three times a day as needed 12)  Pennsaid 1.5 % Soln (Diclofenac sodium) .... 40 gtts applied to each knee qid as needed 13)  Ibuprofen 800 Mg Tabs (Ibuprofen) .Marland Kitchen.. 1 by mouth every 6 hours 14)  Flax Seed Oil 1000 Mg Caps (Flaxseed (linseed)) .... 2 by mouth once daily 15)  Fish Oil 1000 Mg Caps (Omega-3 fatty acids) .... 2 by mouth once daily   Orders Added: 1)  Est. Patient Level III  [65784]

## 2010-06-19 ENCOUNTER — Telehealth (INDEPENDENT_AMBULATORY_CARE_PROVIDER_SITE_OTHER): Payer: Self-pay | Admitting: *Deleted

## 2010-06-28 NOTE — Progress Notes (Signed)
Summary: wants to redo lab work  Phone Note Call from Patient Call back at [employer] [phone]   Caller: Patient Summary of Call: patient called because she says that her last lab work showed that her cholesterol was elavated  ----she says she may have made a mistake and may have taken her meds  and had some orange juice on the day of the lab so she was calling to make an lab appointment to "redo" her labs---I said that we would need an order from the doctor to redo these labs, so I would send a message to the nurse to see if that is what Dr Laury Axon wanted to do--we will need to call her back at [phone] Initial call taken by: Jerolyn Shin,  June 19, 2010 12:38 PM  Follow-up for Phone Call        Pt inform that meds and orange juice would not elevated levels that much. Pt advise recheck labs in 3_months and to work on diet and exercise. Pt verbalized understanding.......Marland KitchenFelecia Deloach CMA  June 19, 2010 4:55 PM

## 2010-07-01 ENCOUNTER — Encounter: Payer: Self-pay | Admitting: Family Medicine

## 2010-07-10 ENCOUNTER — Telehealth (INDEPENDENT_AMBULATORY_CARE_PROVIDER_SITE_OTHER): Payer: Self-pay | Admitting: *Deleted

## 2010-07-18 ENCOUNTER — Encounter: Payer: Self-pay | Admitting: Family Medicine

## 2010-07-18 ENCOUNTER — Ambulatory Visit (INDEPENDENT_AMBULATORY_CARE_PROVIDER_SITE_OTHER): Payer: Medicare Other | Admitting: Family Medicine

## 2010-07-18 DIAGNOSIS — F329 Major depressive disorder, single episode, unspecified: Secondary | ICD-10-CM

## 2010-07-18 DIAGNOSIS — B351 Tinea unguium: Secondary | ICD-10-CM | POA: Insufficient documentation

## 2010-07-18 NOTE — Progress Notes (Signed)
Summary: refill--please print this RX--thanks  Phone Note Refill Request Message from:  Patient  Refills Requested: Medication #1:  OXYCODONE-ACETAMINOPHEN 5-325 MG  TABS 1-2  by mouth two times a day as needed patient will pick up [id number]  Initial call taken by: Okey Regal Spring,  July 10, 2010 3:27 PM  Follow-up for Phone Call        #90 x0 given on 05/30/10. Please advise. Follow-up by: Lucious Groves CMA,  July 10, 2010 4:44 PM  Additional Follow-up for Phone Call Additional follow up Details #1::        refill x1 Additional Follow-up by: Loreen Freud DO,  July 10, 2010 5:24 PM    Additional Follow-up for Phone Call Additional follow up Details #2::    patient would like to pick this up on her way to work at 11:15 am today.Jerolyn Shin  July 11, 2010 10:39 AM  Prescriptions: OXYCODONE-ACETAMINOPHEN 5-325 MG  TABS (OXYCODONE-ACETAMINOPHEN) 1-2  by mouth two times a day as needed  #90 x 0   Entered by:   Almeta Monas CMA (AAMA)   Authorized by:   Loreen Freud DO   Signed by:   Almeta Monas CMA (AAMA) on 07/11/2010   Method used:   Print then Give to Patient   RxID:   [id number]

## 2010-07-25 NOTE — Assessment & Plan Note (Signed)
Summary: fill out paper fordisc  med - [facility]   Vital Signs:  Patient profile:   70 year old female Weight:      181.2 pounds Temp:     98.0 degrees F oral BP sitting:   126 / 70  (right arm) Cuff size:   large  Vitals Entered By: Almeta Monas CMA Duncan Dull) (July 18, 2010 2:37 PM) CC: needs to complete patient assistance forms for pristiq---samples given   History of Present Illness: Pt here to have paperwork filled out for pat assistance and get samples of pristiq.  No complaints.    Current Medications (verified): 1)  Soma 350 Mg  Tabs (Carisoprodol) .Marland Kitchen.. 1 By Mouth At Bedtime As Needed 2)  Oxycodone-Acetaminophen 5-325 Mg  Tabs (Oxycodone-Acetaminophen) .Marland Kitchen.. 1-2  By Mouth Two Times A Day As Needed 3)  Cartia Xt 240 Mg  Cp24 (Diltiazem Hcl Coated Beads) .Marland Kitchen.. 1 By Mouth Once Daily 4)  Atenolol 50 Mg  Tabs (Atenolol) .Marland Kitchen.. 1 By Mouth Two Times A Day 5)  Quinapril Hcl 40 Mg  Tabs (Quinapril Hcl) .Marland Kitchen.. 1 By Mouth Once Daily 6)  Pristiq 100 Mg Xr24h-Tab (Desvenlafaxine Succinate) .... As Directed 7)  Temazepam 15 Mg  Caps (Temazepam) .Marland Kitchen.. 1 By Mouth As Needed 8)  Celebrex 200 Mg Caps (Celecoxib) .Marland Kitchen.. 1 By Mouth Once Daily 9)  Ambien 10 Mg Tabs (Zolpidem Tartrate) .Marland Kitchen.. 1 By Mouth At Bedtime 10)  Astepro 0.15 % Soln (Azelastine Hcl) .... 2 Sprays Each Nostril Once Daily 11)  Ipratropium Bromide 0.06 % Soln (Ipratropium Bromide) .Marland Kitchen.. 1-2 Sprays Each Nostril Up To Three Times A Day As Needed 12)  Pennsaid 1.5 % Soln (Diclofenac Sodium) .... 40 Gtts Applied To Each Knee Qid As Needed 13)  Ibuprofen 800 Mg Tabs (Ibuprofen) .Marland Kitchen.. 1 By Mouth Every 6 Hours 14)  Flax Seed Oil 1000 Mg Caps (Flaxseed (Linseed)) .... 2 By Mouth Once Daily 15)  Fish Oil 1000 Mg Caps (Omega-3 Fatty Acids) .... 2 By Mouth Once Daily  Allergies (verified): 1)  ! Pcn 2)  ! * Flu Vaccination 3)  ! 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) SINUSITIS, ACUTE NOS (ICD-461.9) TMJ PAIN (ICD-524.62) FAMILY HISTORY OF ALCOHOLISM/ADDICTION (ICD-V61.41) FAMILY HISTORY OF CAD FEMALE 1ST DEGREE RELATIVE <50 (ICD-V17.3) OSTEOARTHRITIS (ICD-715.90) HYPERTENSION (ICD-401.9) IBS (ICD-564.1) THYROID NODULE (ICD-241.0) ARTHRITIS (ICD-716.90) HYPERTENSION (ICD-401.9)  Past Surgical History: Last updated: 02/27/2008 Thyroid Surgery-1960 Appendectomy Hernia Surgery Lumpectomy of Left breast x 2 Hysterectomy  Family History: Last updated: 02/27/2008 Family History of CAD Female 1st degree relative <50, father M died brain tumor and 27yo Family History Liver disease, uncle (liver cancer) Family History of Alcoholism/Addiction No FH of Colon Cancer:  Social History: Last updated: 09/02/2009 Patient has never smoked.  Alcohol Use - no Daily Caffeine Use- 2 glasses daily Illicit Drug Use - no Patient does not get regular exercise.  Occupation: [employer] 1 day a week (Retired otherwise) Never Smoked Divorced lives with son  Risk Factors: Exercise: no (02/27/2008)  Risk Factors: Smoking Status: never (06/07/2008)  Family History: Reviewed history from 02/27/2008 and no changes required. Family History of CAD Female 1st degree relative <50, father M died brain  tumor and 27yo Family History Liver disease, uncle (liver cancer) Family History of Alcoholism/Addiction No FH of Colon Cancer:  Social History: Reviewed history from 09/02/2009 and no changes required. Patient has never smoked.  Alcohol Use - no Daily  Caffeine Use- 2 glasses daily Illicit Drug Use - no Patient does not get regular exercise.  Occupation: [employer] 1 day a week (Retired otherwise) Never Smoked Divorced lives with son  Review of Systems      See HPI  Physical Exam  General:  Well-developed,well-nourished,in no acute distress; alert,appropriate and cooperative throughout examination Psych:  Oriented X3 and normally interactive.  Oriented X3 and normally interactive.     Impression & Recommendations:  Problem # 1:  DEPRESSIVE DISORDER (ICD-311) Pt assistance forms filled out Her updated medication list for this problem includes:    Pristiq 100 Mg Xr24h-tab (Desvenlafaxine succinate) .Marland Kitchen... 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 1 by mouth at bedtime as needed 2)  Oxycodone-acetaminophen 5-325 Mg Tabs (Oxycodone-acetaminophen) .Marland Kitchen.. 1-2  by mouth two times a day as needed 3)  Cartia Xt 240 Mg Cp24 (Diltiazem hcl coated beads) .Marland Kitchen.. 1 by mouth once daily 4)  Atenolol 50 Mg Tabs (Atenolol) .Marland Kitchen.. 1 by mouth two times a day 5)  Quinapril Hcl 40 Mg Tabs (Quinapril hcl) .Marland Kitchen.. 1 by mouth once daily 6)  Pristiq 100 Mg Xr24h-tab (Desvenlafaxine succinate) .... As directed 7)  Temazepam 15 Mg Caps (Temazepam) .Marland Kitchen.. 1 by mouth as needed 8)  Celebrex 200 Mg Caps (Celecoxib) .Marland Kitchen.. 1 by mouth once daily 9)  Ambien 10 Mg Tabs (Zolpidem tartrate) .Marland Kitchen.. 1 by mouth at bedtime 10)  Astepro 0.15 % Soln (Azelastine hcl) .... 2 sprays each nostril once daily 11)  Ipratropium Bromide 0.06 % Soln (Ipratropium bromide) .Marland Kitchen.. 1-2 sprays each nostril up to three times a day as needed 12)  Pennsaid 1.5 % Soln (Diclofenac sodium) .... 40 gtts applied to each knee qid as needed 13)  Ibuprofen 800 Mg Tabs (Ibuprofen) .Marland Kitchen.. 1 by mouth every 6 hours 14)  Flax Seed Oil 1000 Mg Caps (Flaxseed (linseed)) .... 2 by mouth once  daily 15)  Fish Oil 1000 Mg Caps (Omega-3 fatty acids) .... 2 by mouth once daily Prescriptions: PRISTIQ 100 MG XR24H-TAB (DESVENLAFAXINE SUCCINATE) as directed  #90 x 3   Entered and Authorized by:   Loreen Freud DO   Signed by:   Loreen Freud DO on 07/18/2010   Method used:   Print then Give to Patient   RxID:   [id number]    Orders Added: 1)  Podiatry Referral [Podiatry] 2)  Est. Patient Level III [14782]

## 2010-08-08 ENCOUNTER — Other Ambulatory Visit: Payer: Self-pay | Admitting: Oncology

## 2010-08-08 DIAGNOSIS — Z9889 Other specified postprocedural states: Secondary | ICD-10-CM

## 2010-08-16 ENCOUNTER — Other Ambulatory Visit: Payer: Self-pay

## 2010-08-16 MED ORDER — QUINAPRIL HCL 40 MG PO TABS: 40.0000 mg | ORAL_TABLET | Freq: Every day | ORAL | Status: DC

## 2010-08-17 ENCOUNTER — Other Ambulatory Visit: Payer: Self-pay | Admitting: Family Medicine

## 2010-08-17 MED ORDER — QUINAPRIL HCL 40 MG PO TABS: 40.0000 mg | ORAL_TABLET | Freq: Every day | ORAL | Status: DC

## 2010-08-17 NOTE — Telephone Encounter (Signed)
Re manually faxed to [facility]     KP

## 2010-08-17 NOTE — Telephone Encounter (Signed)
Addended by: Almeta Monas on: 08/17/2010 01:48 PM   Modules accepted: Orders

## 2010-08-17 NOTE — Telephone Encounter (Signed)
Needs prescription for Oxycodon  APAP   5/325.   Please call her when prescription is ready for pickup

## 2010-08-18 MED ORDER — OXYCODONE-ACETAMINOPHEN 5-325 MG PO TABS: ORAL_TABLET | ORAL | Status: DC

## 2010-08-18 NOTE — Telephone Encounter (Signed)
Last filled 07/11/10 and seen 07/18/10 please advise    KP

## 2010-08-18 NOTE — Telephone Encounter (Signed)
Pt picked up      Amanda Joyce

## 2010-09-05 ENCOUNTER — Telehealth: Payer: Self-pay | Admitting: Family Medicine

## 2010-09-05 NOTE — Telephone Encounter (Signed)
Last seen 07/18/10 and filled 04/26/10 please advise    KP

## 2010-09-05 NOTE — Telephone Encounter (Signed)
Refill x1 

## 2010-09-05 NOTE — Telephone Encounter (Signed)
Patient needs refill on Ambien 10 mg (90 pills)called into [facility] , [address], [place]

## 2010-09-06 MED ORDER — ZOLPIDEM TARTRATE 10 MG PO TABS: 10.0000 mg | ORAL_TABLET | Freq: Every evening | ORAL | Status: DC | PRN

## 2010-09-06 NOTE — Telephone Encounter (Signed)
Rx faxed

## 2010-09-19 NOTE — Assessment & Plan Note (Signed)
[facility]                                 ON-CALL NOTE   NAME:[name], [name]                         MRN:          [id number]  DATE:05/29/2008                            DOB:          1941-01-06    TIME RECEIVED:  11:15 a.m.   The caller is the patient.  She sees Dr. Beverely Low.   TELEPHONE:  [phone].   The patient has been given Avelox for an upper respiratory infection to  take a 400-mg tablet daily.  Even when eating food, this upsets her  stomach and she would like to know if she could half the dose.  Indeed I  told her it would be fine to take 1/2 tablet b.i.d. for the remainder of  her course and she can follow up as needed.     Tera Mater. Clent Ridges, MD  Electronically Signed    SAF/MedQ  DD: 05/29/2008  DT: 05/29/2008  Job #: [id number]

## 2010-09-19 NOTE — Op Note (Signed)
Amanda Joyce, DICK NO.:  [id number]   MEDICAL RECORD NO.:  [id number]          PATIENT TYPE:  AMB   LOCATION:  SDC                           FACILITY:  [facility]   PHYSICIAN:  Miguel Aschoff, M.D.       DATE OF BIRTH:  Jan 03, 1941   DATE OF PROCEDURE:  03/31/2007  DATE OF DISCHARGE:                               OPERATIVE REPORT   PREOPERATIVE DIAGNOSES:  1. Solid left adnexal mass.  2. Postmenopausal bleeding.   POSTOPERATIVE DIAGNOSES:  1. Solid left ovarian tumor, etiology unknown.  2. Postmenopausal bleeding.   PROCEDURES:  1. Diagnostic laparoscopy.  2. Pelvic washings an abdominal cytology.  3. Ovarian biopsy followed.  4. Cervical dilatation.  5. Hysteroscopy.  6. Uterine curettage.   SURGEON:  Miguel Aschoff, M.D.   ANESTHESIA:  General.   COMPLICATIONS:  None.   JUSTIFICATION:  The patient is a 70 year old white female who was having  irregular vaginal bleeding and was seen at [facility] emergency room.  At that time CT scanning was performed and showed a complex mass  involving the left adnexa.  The etiology of this mass was undetermined.  The patient has a previous history of breast carcinoma with lumpectomy  in 2006.  Ultrasound in follow-up on this mass showed it to be solid and  appear to be adjacent to the uterus and had ultrasound characteristics  suggestive of a uterine myoma.  Because of the uncertainty as to whether  this represented an ovarian neoplasm or a benign fibroid, the patient is  now being taken to the operating room to undergo laparoscopy, possible  laparotomy as well as D&C and hysteroscopy to assess the etiology of the  bleeding.  The risks and benefits of the procedure were discussed with  the patient.   PROCEDURE:  The patient was taken to the operating room and placed in  the supine position and general anesthesia was then administered without  difficulty.  She was placed in the dorsal lithotomy position and prepped  and  draped in the usual sterile fashion.  The bladder was catheterized.  A Hulka tenaculum was placed through the cervix and held.  Attention was  then directed to the umbilicus, where a small infraumbilical incision  was made, a Veress needle was inserted and the abdomen arm was  insufflated with 3 L of CO2.  Following the insufflation the laparoscope  was introduced and under direct visualization, a 5-mm suprapubic port  was established without difficulty.  At this point systematic inspection  of the pelvis was carried out.  The anterior bladder peritoneum was  unremarkable.  The round ligaments were unremarkable.  It was noted that  the right tube and ovary were absent consistent with patient's history  of a previous right oophorectomy and appendectomy.  On the left side  there was a large mass approximately 7 cm in size, solid, encompassing  the ovary.  There appeared to be plaque-like lesions also noted on the  ovarian surface.  Intestinal surfaces were inspected.  They appeared to  be within normal limits.  The peritoneum  to the cul-de-sac was  unremarkable.  The liver surface was inspected and appeared to be  completely normal.  Since this mass now proved to be a solid ovarian  mass rather than a uterine fibroid, it was elected to proceed with  biopsies of this mass to establish whether this represented a  malignancy, for which a major laparotomy and staging procedure would be  required, or if this represented a benign fibroma, which could be dealt  with either laparoscopically or through a minilaparotomy.  The biopsies  were obtained without difficulty . four or five biopsies were obtained  of the external excrescence site on the ovarian surface.  There was  slight oozing noted from these sites.  This was treated with bipolar  cautery and prompt hemostasis was achieved.  Once this was completed,  the abdomen was irrigated with saline.  Excellent hemostasis was  present, and this  portion of the procedure was completed.  CO2 was  allowed to escape.  The instruments were removed.  The small incisions  were closed using subcuticular 4-0 Vicryl.  The port sites were injected  with a total of 8 mL of 0.25% Marcaine.  Because of postmenopausal  bleeding, the patient then underwent D&C and hysteroscopy.  The patient  was placed in lithotomy position.  A speculum was placed in the vaginal  vault.  The anterior cervical lip was grasped with a tenaculum and then  further dilated using serial Pratt dilators until a number #23 Pratt  dilator could be passed.  At this point the diagnostic hysteroscope was  advanced through the endocervix.  No endocervical lesions were noted.  The endometrial cavity was inspected and appeared to be mostly atrophic  with many areas of petechial hemorrhage.  This was thought to be the  source of the bleeding, which would have been secondary to atrophy.  Vigorous sharp curettage was then carried out using a serrated curette,  and a small amount of tissue was obtained.  This was sent for histologic  study.  At this point all instruments were removed.  The patient was  reversed from the anesthetic and taken to the recovery room in  satisfactory condition.  The estimated blood loss was minimal.   Plan is for the patient to call for pathology report on November 26.  If  this proves to be a malignancy, a referral will be made to a gynecologic  oncologist to undergo laparotomy and staging.  If the biopsies probe to  be benign, the plan would be for the patient to return to the operating  room for either a laparoscopic oophorectomy for a laparotomy for a total  abdominal hysterectomy and a left salpingo-oophorectomy.  Again, there  was no evidence of any other abnormal findings within the abdomen.   The patient is being discharged home.  Medications for home:  1. Tylox one every 3 hours as needed for pain.  2. Doxycycline 100 mg twice a day x3 days.    Again, the patient is call for pathology report on November 26 and call  if there are any problems such as fever, pain or heavy bleeding.      Miguel Aschoff, M.D.  Electronically Signed     AR/MEDQ  D:  03/31/2007  T:  03/31/2007  Job:  [id number]

## 2010-09-19 NOTE — Op Note (Signed)
NAMETRENYCE, LOERA NO.:  [id number]   MEDICAL RECORD NO.:  [id number]          PATIENT TYPE:  INP   LOCATION:  9320                          FACILITY:  [facility]   PHYSICIAN:  Miguel Aschoff, M.D.       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   PROCEDURE:  Laparoscopically assisted vaginal hysterectomy with left  salpingo-oophorectomy.   SURGEON:  Miguel Aschoff, M.D.   ANESTHESIA:  General.   ASSISTANT:  Luvenia Redden, M.D.   COMPLICATIONS:  None.   JUSTIFICATION:  The patient is a 70 year old white female noted on  evaluation for post menopausal bleeding to have a large solid mass  involving the right ovary.  The patient had been previously laparoscoped  and this mass biopsied and the biopsy report proved it to be benign  fibroma or thecoma of the ovary.  She presents now to undergo definitive  therapy via laparoscopically assisted vaginal hysterectomy and left  salpingo-oophorectomy to both resolve her issues with post menopausal  bleeding as well as this left adnexal mass.  The risks and benefits of  the procedure were discussed with the patient.   DESCRIPTION OF PROCEDURE:  The patient was taken to the operating room  and placed in the supine position.  General anesthesia was administered  without difficulty.  She was then placed in the dorsal lithotomy  position and prepped and draped in the usual sterile fashion.  A Foley  catheter was inserted.  Attention was then directed to the umbilicus  where a small umbilical incision was made, a Veress needle was inserted,  and the abdomen was insufflated with 3 liters of CO2.  Following  insufflation, the trocar  to the laparoscope was placed followed by the  laparoscope, itself.  Then, under direct visualization, a 5 mm port was  established in the right and left lower quadrants.   After this was done, systematic inspection showed the uterus to be  anterior.  It was noted to have small fibroids present within the fundus  of the uterus.  The right tube and ovary were absent due previous pelvic  surgery. The left tube was normal.  The left ovary, again, was involved  with a large solid mass, again being 9 cm in size.  Intestinal surfaces  were unremarkable.  The liver was inspected and noted to be within  normal limits.  No other abnormalities were noted in the abdomen.  At  this point, the gyrus unit was introduced and the round ligament on the  right side was identified, cauterized, and cut.  Then, dissection  continued along the broad ligament taking serial bites with the gyrus  unit and cutting until just below the level of the uterine vessels.  Attention was then directed on the left side where the infundibulopelvic  ligament was identified as was the ureter and then, using the gyrus  unit, serial bites were taken along the meso-ovarian ligament and the  infundibulopelvic ligament and dissection continued medially until the  tube and ovary were freed from their ligaments and only attached at the  coronal portion of the uterus. Additional bites were taken of the broad  ligament structures until the uterine vessels were found, cauterized,  and cut.   At this point, attention was directed to the vaginal portion of the  procedure. The patient was placed in the dorsal lithotomy position.  A  weighted speculum was placed in the vaginal vault, the anterior cervical  lip was grasped with a tenaculum, and then the cervix was injected with  10 mL 1% Xylocaine with epinephrine for hemostasis.  The cervical mucosa  was circumscribed and dissected anteriorly and posteriorly until the  peritoneal  reflections were found.  The peritoneum was then entered  without difficulty.  At this point, the uterosacral ligaments were  clamped with curved Heaney clamps, pedicles were cut and suture ligated  using suture ligatures of 0 Vicryl.  The cardinal ligaments were  clamped, cut and suture ligated in a similar fashion.  Serial bites were  then taken of the paracervical fascia using curved Heaney clamps. All  pedicles were suture ligated using suture ligatures of 0 Vicryl.  At  this point, the residual structures were clamped with curved Heaney  clamps, these pedicles cut, and the specimen was freed. The specimen  consisted of the uterus, the left tube and ovary.  It was then possible  to remove this large ovarian mass vaginally, but it did require it to be  somewhat morcellated. This was felt to be okay due to the benign biopsy  reports previously obtained on this ovary.   Once the specimens were removed, the posterior vaginal cuff was run  using running interlocking 0 Vicryl suture.  The peritoneum was then  closed using a pursestring suture of 0 Vicryl.  Once this was done, the  vaginal mucosa was reapproximated using running interlocking 0 Vicryl  sutures.  An Iodoform pack was then placed.  A Foley catheter had been  previously placed.  Attention was then directed up to the laparoscopic  portion of the procedure.  The abdomen was reinsufflated.  Inspection  was made for hemostasis.  Hemostasis appeared to be excellent.  At this  point, with no other problems being noted and with good hemostasis and  instrument counts and lap counts taken and found to be correct, it was  decided to complete the procedure. The CO2 was allowed to escape.  All  the instruments were removed and then the small incisions on the abdomen  were closed using subcuticular 4-0 Vicryl suture.  The estimated blood  loss from the procedure was approximately 100 mL.  The patient tolerated  the procedure well.       Miguel Aschoff, M.D.  Electronically Signed     AR/MEDQ  D:  04/22/2007  T:  04/22/2007  Job:  [id number]

## 2010-09-19 NOTE — Discharge Summary (Signed)
[name], Amanda Joyce NO.:  [id number]   MEDICAL RECORD NO.:  [id number]          PATIENT TYPE:  INP   LOCATION:  9320                          FACILITY:  [facility]   PHYSICIAN:  Miguel Aschoff, M.D.       DATE OF BIRTH:  Aug 05, 1940   DATE OF ADMISSION:  04/22/2007  DATE OF DISCHARGE:  04/23/2007                               DISCHARGE SUMMARY   ADMISSION DIAGNOSES:  Left adnexal mass, post-menopausal bleeding.   FINAL DIAGNOSES:  Benign ovarian fibroma, leiomyomata, adenomyosis,  endometrial polyp.   OPERATIONS AND PROCEDURES:  Laparoscopically assisted total vaginal  hysterectomy and left salpingo-oophorectomy.   ANESTHESIA:  General.   BRIEF HISTORY:  The patient is a 70 year old white female who, on  evaluation for postmenopausal bleeding, was noted to have a large, 7-10  cm solid mass involving the left ovary.  The patient had previously  undergone laparoscopy and biopsy of this mass and this proved to be on  biopsy a fibroma or thecoma.  She is now presenting to the hospital to  undergo definitive therapy with the laparoscopically assisted  hysterectomy and salpingo-oophorectomy/  The patient had a previous  right salpingo-oophorectomy.   HOSPITAL COURSE:  Preoperative studies were obtained.  This revealed  admission hemoglobin of 14.3, hematocrit of 40.8.  White count was  5,200.  Chemistry profile was essentially within normal limits.  Under  general anesthesia on 04/22/2007, a laparoscopically assisted total  vaginal hysterectomy and left salpingo-oophorectomy was carried out  without difficulty.  The large ovarian mass was removed without  difficulty.  It did measure 8 x 8 x 4 cm.  The uterus measured 8 x 5 x  3.5 cm and weighed 59 gm.  The adnexal mass weighed 149 grams.  The  patient's postop course was essentially uncomplicated.  After tolerating  her surgical procedure well, she did tolerate increasing ambulation and  diet well and by 04/23/2007 was in  satisfactory condition and able to be  discharged home.   MEDICATIONS:  For home included Tylox 1 every 3-5 hours as needed for  pain.   DISCHARGE INSTRUCTIONS:  1. She was instructed to do no heavy lifting, place nothing in the      vagina.  2. Call if there are any new problems such as breakthrough pain or      heavy bleeding.  3. The patient is to be seen back in 4 weeks for a followup      examination.   PATHOLOGY:  The pathology report on the surgical specimens revealed  endometrial polyps involving the uterus, leiomyomata and adenomyosis.  A  large left ovarian mass proved to be benign ovarian fibroma.  Fallopian  tube was unremarkable.      Miguel Aschoff, M.D.  Electronically Signed     AR/MEDQ  D:  05/09/2007  T:  05/09/2007  Job:  [id number]

## 2010-09-28 ENCOUNTER — Ambulatory Visit (INDEPENDENT_AMBULATORY_CARE_PROVIDER_SITE_OTHER): Payer: Medicare Other | Admitting: Family Medicine

## 2010-09-28 ENCOUNTER — Encounter: Payer: Self-pay | Admitting: Family Medicine

## 2010-09-28 DIAGNOSIS — K449 Diaphragmatic hernia without obstruction or gangrene: Secondary | ICD-10-CM

## 2010-09-28 DIAGNOSIS — K222 Esophageal obstruction: Secondary | ICD-10-CM

## 2010-09-28 DIAGNOSIS — F329 Major depressive disorder, single episode, unspecified: Secondary | ICD-10-CM

## 2010-09-28 DIAGNOSIS — M549 Dorsalgia, unspecified: Secondary | ICD-10-CM

## 2010-09-28 DIAGNOSIS — K219 Gastro-esophageal reflux disease without esophagitis: Secondary | ICD-10-CM

## 2010-09-28 DIAGNOSIS — E785 Hyperlipidemia, unspecified: Secondary | ICD-10-CM

## 2010-09-28 DIAGNOSIS — M1710 Unilateral primary osteoarthritis, unspecified knee: Secondary | ICD-10-CM

## 2010-09-28 DIAGNOSIS — I10 Essential (primary) hypertension: Secondary | ICD-10-CM

## 2010-09-28 DIAGNOSIS — G8929 Other chronic pain: Secondary | ICD-10-CM

## 2010-09-28 DIAGNOSIS — N390 Urinary tract infection, site not specified: Secondary | ICD-10-CM

## 2010-09-28 DIAGNOSIS — E559 Vitamin D deficiency, unspecified: Secondary | ICD-10-CM

## 2010-09-28 DIAGNOSIS — IMO0002 Reserved for concepts with insufficient information to code with codable children: Secondary | ICD-10-CM

## 2010-09-28 LAB — POCT URINALYSIS DIPSTICK
Nitrite, UA: POSITIVE
Spec Grav, UA: 1.015
pH, UA: 5

## 2010-09-28 MED ORDER — OXYCODONE-ACETAMINOPHEN 5-325 MG PO TABS: ORAL_TABLET | ORAL | Status: DC

## 2010-09-28 MED ORDER — CIPROFLOXACIN HCL 500 MG PO TABS: 500.0000 mg | ORAL_TABLET | Freq: Two times a day (BID) | ORAL | Status: AC

## 2010-09-28 MED ORDER — IBUPROFEN 800 MG PO TABS: 800.0000 mg | ORAL_TABLET | Freq: Four times a day (QID) | ORAL | Status: DC | PRN

## 2010-09-28 MED ORDER — PANTOPRAZOLE SODIUM 40 MG PO TBEC: 40.0000 mg | DELAYED_RELEASE_TABLET | Freq: Every day | ORAL | Status: DC

## 2010-09-28 NOTE — Assessment & Plan Note (Signed)
Check labs 

## 2010-09-28 NOTE — Assessment & Plan Note (Signed)
Refill meds

## 2010-09-28 NOTE — Assessment & Plan Note (Signed)
protonix daily F/u GI if no better

## 2010-09-28 NOTE — Assessment & Plan Note (Signed)
con't meds 

## 2010-09-28 NOTE — Progress Notes (Signed)
  Subjective:    Patient ID: Amanda Joyce, female    DOB: Nov 05, 1940, 70 y.o.   MRN: [id number]  HPI Pt here for med refills.  She was given protonix by Dr Arlyce Dice about 2 years ago but she never took it.   Pt had stricture at the time.  She now is feeling bloating again but meds are 70 years old.  No cp, sob, palp.     Review of Systems    as above Objective:   Physical Exam  Constitutional: She is oriented to person, place, and time. She appears well-developed and well-nourished.  Cardiovascular: Normal rate, regular rhythm and normal heart sounds.   Pulmonary/Chest: Effort normal and breath sounds normal.  Neurological: She is alert and oriented to person, place, and time.  Psychiatric: She has a normal mood and affect. Her behavior is normal. Judgment and thought content normal.          Assessment & Plan:

## 2010-10-01 LAB — URINE CULTURE: Colony Count: >=100000

## 2010-10-03 ENCOUNTER — Other Ambulatory Visit: Payer: Self-pay | Admitting: *Deleted

## 2010-10-04 ENCOUNTER — Other Ambulatory Visit (INDEPENDENT_AMBULATORY_CARE_PROVIDER_SITE_OTHER): Payer: Medicare Other

## 2010-10-04 DIAGNOSIS — Z9889 Other specified postprocedural states: Secondary | ICD-10-CM

## 2010-10-04 DIAGNOSIS — E785 Hyperlipidemia, unspecified: Secondary | ICD-10-CM

## 2010-10-04 DIAGNOSIS — I10 Essential (primary) hypertension: Secondary | ICD-10-CM

## 2010-10-04 DIAGNOSIS — M549 Dorsalgia, unspecified: Secondary | ICD-10-CM

## 2010-10-04 LAB — CBC WITH DIFFERENTIAL/PLATELET
Eosinophils Relative: 4.1 % (ref 0.0–5.0)
HCT: 41.2 % (ref 36.0–46.0)
Hemoglobin: 14.2 g/dL (ref 12.0–15.0)
Lymphs Abs: 2.3 10*3/uL (ref 0.7–4.0)
Monocytes Relative: 8.3 % (ref 3.0–12.0)
Neutro Abs: 3.8 10*3/uL (ref 1.4–7.7)
WBC: 7.1 10*3/uL (ref 4.5–10.5)

## 2010-10-04 LAB — POCT URINALYSIS DIPSTICK
Bilirubin, UA: NEGATIVE
Glucose, UA: NEGATIVE
Ketones, UA: NEGATIVE
Leukocytes, UA: NEGATIVE
pH, UA: 6.5

## 2010-10-04 LAB — BASIC METABOLIC PANEL
GFR: 77.56 mL/min (ref >60.00)
Potassium: 4.2 mEq/L (ref 3.5–5.1)
Sodium: 139 mEq/L (ref 135–145)

## 2010-10-04 LAB — LIPID PANEL
HDL: 47.6 mg/dL (ref >39.00)
Triglycerides: 177 mg/dL — ABNORMAL HIGH (ref 0.0–149.0)

## 2010-10-04 LAB — HEPATIC FUNCTION PANEL
ALT: 14 U/L (ref 0–35)
Albumin: 4 g/dL (ref 3.5–5.2)
Total Protein: 6.6 g/dL (ref 6.0–8.3)

## 2010-10-06 LAB — URINE CULTURE

## 2010-10-10 ENCOUNTER — Other Ambulatory Visit: Payer: Self-pay | Admitting: Family Medicine

## 2010-10-10 NOTE — Telephone Encounter (Signed)
Spoke with patient-- her last sample was contaminated will bring in a new sample tomorrow at 11:00 appt scheduled in the lab    KP

## 2010-10-10 NOTE — Progress Notes (Signed)
12  

## 2010-10-11 ENCOUNTER — Other Ambulatory Visit (INDEPENDENT_AMBULATORY_CARE_PROVIDER_SITE_OTHER): Payer: Medicare Other

## 2010-10-11 ENCOUNTER — Encounter: Payer: Self-pay | Admitting: *Deleted

## 2010-10-11 ENCOUNTER — Telehealth: Payer: Self-pay | Admitting: *Deleted

## 2010-10-11 DIAGNOSIS — N390 Urinary tract infection, site not specified: Secondary | ICD-10-CM

## 2010-10-11 LAB — POCT URINALYSIS DIPSTICK
Bilirubin, UA: NEGATIVE
Glucose, UA: NEGATIVE
Ketones, UA: NEGATIVE
Spec Grav, UA: 1.005

## 2010-10-11 MED ORDER — PITAVASTATIN CALCIUM 2 MG PO TABS: 1.0000 | ORAL_TABLET | Freq: Every day | ORAL | Status: DC

## 2010-10-11 NOTE — Telephone Encounter (Signed)
Discuss with patient, Rx sent to pharmacy, copy of labs mailed.

## 2010-10-11 NOTE — Progress Notes (Signed)
Labs only

## 2010-10-11 NOTE — Telephone Encounter (Signed)
Message copied by Verdene Rio on Wed Oct 11, 2010 12:00 PM ------      Message from: Lelon Perla      Created: Mon Oct 09, 2010  5:19 PM       Cholesterol--- LDL goal < 100,  HDL >40,  TG < 150.  Diet and exercise will increase HDL and decrease LDL and TG.  Fish,  Fish Oil, Flaxseed oil will also help increase the HDL and decrease Triglycerides.   Pt increased risk MI and stroke.  Start livalo 2 mg #30  1 po qhs, 2 refills and take otc CoQ10.   Recheck labs in 3 months.   272.4  Lipid, hep            Urine contaminated---recheck if symptomatic

## 2010-10-13 ENCOUNTER — Other Ambulatory Visit: Payer: Self-pay | Admitting: Family Medicine

## 2010-10-13 ENCOUNTER — Encounter: Payer: Self-pay | Admitting: Gastroenterology

## 2010-10-13 LAB — URINE CULTURE: Colony Count: 60000

## 2010-10-13 NOTE — Telephone Encounter (Signed)
Has she tried pravachol?  20 mg 1 po qhs , 2 refills

## 2010-10-13 NOTE — Telephone Encounter (Signed)
Livalo is $92 and patient would like to change it to something cheaper. Please advise    KP

## 2010-10-16 MED ORDER — PRAVASTATIN SODIUM 20 MG PO TABS: 20.0000 mg | ORAL_TABLET | Freq: Every evening | ORAL | Status: DC

## 2010-10-16 NOTE — Telephone Encounter (Signed)
Patient aware of new meds and voiced understanding    KP

## 2010-11-20 ENCOUNTER — Other Ambulatory Visit: Payer: Self-pay | Admitting: Family Medicine

## 2010-11-20 DIAGNOSIS — G8929 Other chronic pain: Secondary | ICD-10-CM

## 2010-11-20 NOTE — Telephone Encounter (Signed)
Refill x1 

## 2010-11-20 NOTE — Telephone Encounter (Signed)
09-28-10, last filled #90, last OV

## 2010-11-21 MED ORDER — OXYCODONE-ACETAMINOPHEN 5-325 MG PO TABS: ORAL_TABLET | ORAL | Status: DC

## 2010-11-21 NOTE — Telephone Encounter (Signed)
Patient here to pickup Rx     KP

## 2010-11-27 ENCOUNTER — Ambulatory Visit (INDEPENDENT_AMBULATORY_CARE_PROVIDER_SITE_OTHER): Payer: Medicare Other | Admitting: Gastroenterology

## 2010-11-27 ENCOUNTER — Encounter: Payer: Self-pay | Admitting: Gastroenterology

## 2010-11-27 DIAGNOSIS — K3189 Other diseases of stomach and duodenum: Secondary | ICD-10-CM

## 2010-11-27 DIAGNOSIS — R1013 Epigastric pain: Secondary | ICD-10-CM

## 2010-11-27 DIAGNOSIS — K648 Other hemorrhoids: Secondary | ICD-10-CM

## 2010-11-27 DIAGNOSIS — R1310 Dysphagia, unspecified: Secondary | ICD-10-CM

## 2010-11-27 MED ORDER — HYDROCORTISONE ACETATE 25 MG RE SUPP: 25.0000 mg | Freq: Two times a day (BID) | RECTAL | Status: AC

## 2010-11-27 NOTE — Assessment & Plan Note (Signed)
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  Recommendations #1 upper endoscopy with dilatation as indicated  Risks, alternatives, and complications of the procedure, including bleeding, perforation, and possible need for surgery, were explained to the patient.  Patient's questions were answered.

## 2010-11-27 NOTE — Progress Notes (Signed)
History of Present Illness:  Amanda Joyce is a 70 year old white female with history of breast cancer, esophageal stricture, depression and arthritis referred at the request of Dr. Randa Lynn for evaluation of dysphagia. She's had recurrent dysphagia to solids. She also complains of postprandial bloating and mild discomfort. She takes ibuprofen on a regular basis. She also has noted some rectal discomfort with a protuberance.     Review of Systems: Pertinent positive and negative review of systems were noted in the above HPI section. All other review of systems were otherwise negative.    Current Medications, Allergies, Past Medical History, Past Surgical History, Family History and Social History were reviewed in [employer] electronic medical record  Vital signs were reviewed in today's medical record. Physical Exam: General: Well developed , well nourished, no acute distress Head: Normocephalic and atraumatic Eyes:  sclerae anicteric, EOMI Ears: Normal auditory acuity Mouth: No deformity or lesions Lungs: Clear throughout to auscultation Heart: Regular rate and rhythm; no murmurs, rubs or bruits Abdomen: Soft, non tender and non distended. No masses, hepatosplenomegaly or hernias noted. Normal Bowel sounds Rectal: There is a small partially thrombosed hemorrhoid Musculoskeletal: Symmetrical with no gross deformities  Pulses:  Normal pulses noted Extremities: No clubbing, cyanosis, edema or deformities noted Neurological: Alert oriented x 4, grossly nonfocal Psychological:  Alert and cooperative. Normal mood and affect

## 2010-11-27 NOTE — Assessment & Plan Note (Signed)
Patient has a partially thrombosed external hemorrhoid.  Medications #1 Anusol a.c. suppositories, local care and warm soaks

## 2010-11-27 NOTE — Patient Instructions (Addendum)
Hemorrhoids Hemorrhoids are dilated (enlarged) veins around the rectum. Sometimes clots will form in the veins. This makes them swollen and painful. These are called thrombosed hemorrhoids. Causes of hemorrhoids include:  Pregnancy: this increases the pressure in the hemorrhoidal veins.   Constipation.   Straining to have a bowel movement.  HOME CARE INSTRUCTIONS  Eat a well balanced diet and drink 6 to 8 glasses of water every day to avoid constipation. You may also use a bulk laxative.   Avoid straining to have bowel movements.   Keep anal area dry and clean.   Only take over-the-counter or prescription medicines for pain, discomfort, or fever as directed by your caregiver.  If thrombosed:  Take hot sitz baths for 20 to 30 minutes, 3 to 4 times per day.   If the hemorrhoids are very tender and swollen, place ice packs on area as tolerated. Using ice packs between sitz baths may be helpful. Fill a plastic bag with ice and use a towel between the bag of ice and your skin.   Special creams and suppositories (Anusol, Nupercainal, Wyanoids) may be used or applied as directed.   Do not use a donut shaped pillow or sit on the toilet for long periods. This increases blood pooling and pain.   Move your bowels when your body has the urge; this will require less straining and will decrease pain and pressure.   Only take over-the-counter or prescription medicines for pain, discomfort, or fever as directed by your caregiver.  SEEK MEDICAL CARE IF:  You have increasing pain and swelling that is not controlled with your prescription.   You have uncontrolled bleeding.   You have an inability or difficulty having a bowel movement.   You have pain or inflammation outside the area of the hemorrhoids.   You have chills and/or an oral temperature above 100 that lasts for 2 days or longer, or as your caregiver suggests.  MAKE SURE YOU:   Understand these instructions.   Will watch your  condition.   Will get help right away if you are not doing well or get worse.  Document Released: 04/20/2000 Document Re-Released: 04/05/2008 [facility] Patient Information 2011 [place], [place]. Gave pt Suprep for colonoscopy/Endoscopy  scheduled on 11/28/2010 at [facility] over instructions with patient

## 2010-11-27 NOTE — Assessment & Plan Note (Addendum)
She may have ulcer or non-ulcer dyspepsia. Ibuprofen use may be contributing.  Recommendations #1 upper endoscopy #2 continue Protonix. If she's not improved I advised the patient to try holding the ibuprofen

## 2010-11-28 ENCOUNTER — Encounter: Payer: Self-pay | Admitting: Gastroenterology

## 2010-11-28 ENCOUNTER — Ambulatory Visit (AMBULATORY_SURGERY_CENTER): Payer: Medicare Other | Admitting: Gastroenterology

## 2010-11-28 VITALS — BP 124/62 | HR 67 | Temp 99.1°F | Resp 18 | Ht 69.0 in | Wt 174.0 lb

## 2010-11-28 DIAGNOSIS — K449 Diaphragmatic hernia without obstruction or gangrene: Secondary | ICD-10-CM

## 2010-11-28 DIAGNOSIS — D126 Benign neoplasm of colon, unspecified: Secondary | ICD-10-CM

## 2010-11-28 DIAGNOSIS — Z1211 Encounter for screening for malignant neoplasm of colon: Secondary | ICD-10-CM

## 2010-11-28 DIAGNOSIS — K3189 Other diseases of stomach and duodenum: Secondary | ICD-10-CM

## 2010-11-28 DIAGNOSIS — K222 Esophageal obstruction: Secondary | ICD-10-CM

## 2010-11-28 DIAGNOSIS — K5730 Diverticulosis of large intestine without perforation or abscess without bleeding: Secondary | ICD-10-CM

## 2010-11-28 DIAGNOSIS — K648 Other hemorrhoids: Secondary | ICD-10-CM

## 2010-11-28 MED ORDER — SODIUM CHLORIDE 0.9 % IV SOLN: 500.0000 mL | INTRAVENOUS | Status: DC

## 2010-11-28 NOTE — Progress Notes (Signed)
  Unsuccessful attempts to right wrist and AC with 22 gauge by A. Beckey Downing, RN.

## 2010-11-28 NOTE — Patient Instructions (Addendum)
If no improvement in abdominal complaints in 4-5 days, hold ibuprofen  Please refer to your neon green sheet for instructions regarding diet and exercise for the rest of today.  Please follow the Esophageal Dilation Diet for today:  -Nothing by mouth until 5 pm  -Clear Liquids for 1 hour 5 pm-6 pm  -Soft foods for the rest of today 6 pm until tomorrow.  Please call Dr Marzetta Board office tomorrow for a follow up appointment in 4 weeks

## 2010-11-29 ENCOUNTER — Telehealth: Payer: Self-pay | Admitting: *Deleted

## 2010-11-29 NOTE — Telephone Encounter (Signed)
Spoke with pts son.  Left message for pt to call us back if she has questions or concerns.

## 2010-12-04 ENCOUNTER — Ambulatory Visit
Admission: RE | Admit: 2010-12-04 | Discharge: 2010-12-04 | Disposition: A | Discharge status: Home / Self Care | Payer: Medicare Other | Source: Ambulatory Visit | Attending: Oncology | Admitting: Oncology

## 2010-12-06 ENCOUNTER — Other Ambulatory Visit: Payer: Self-pay | Admitting: Oncology

## 2010-12-06 ENCOUNTER — Encounter (HOSPITAL_BASED_OUTPATIENT_CLINIC_OR_DEPARTMENT_OTHER): Payer: Medicare Other | Admitting: Oncology

## 2010-12-06 DIAGNOSIS — Z170 Estrogen receptor positive status [ER+]: Secondary | ICD-10-CM

## 2010-12-06 DIAGNOSIS — C50919 Malignant neoplasm of unspecified site of unspecified female breast: Secondary | ICD-10-CM

## 2010-12-06 LAB — CBC WITH DIFFERENTIAL/PLATELET
Basophils Absolute: 0.1 10*3/uL (ref 0.0–0.1)
Eosinophils Absolute: 0.2 10*3/uL (ref 0.0–0.5)
HGB: 13.8 g/dL (ref 11.6–15.9)
MCV: 89.1 fL (ref 79.5–101.0)
MONO#: 0.6 10*3/uL (ref 0.1–0.9)
MONO%: 7.7 % (ref 0.0–14.0)
NEUT#: 4.9 10*3/uL (ref 1.5–6.5)
RDW: 12.7 % (ref 11.2–14.5)

## 2010-12-06 LAB — COMPREHENSIVE METABOLIC PANEL
ALT: 13 U/L (ref 0–35)
CO2: 29 mEq/L (ref 19–32)
Chloride: 101 mEq/L (ref 96–112)
Sodium: 138 mEq/L (ref 135–145)
Total Bilirubin: 0.6 mg/dL (ref 0.3–1.2)
Total Protein: 7.3 g/dL (ref 6.0–8.3)

## 2010-12-06 LAB — CANCER ANTIGEN 27.29: CA 27.29: 24 U/mL (ref 0–39)

## 2010-12-12 ENCOUNTER — Encounter (HOSPITAL_BASED_OUTPATIENT_CLINIC_OR_DEPARTMENT_OTHER): Payer: Medicare Other | Admitting: Oncology

## 2010-12-12 DIAGNOSIS — C50919 Malignant neoplasm of unspecified site of unspecified female breast: Secondary | ICD-10-CM

## 2010-12-12 DIAGNOSIS — Z170 Estrogen receptor positive status [ER+]: Secondary | ICD-10-CM

## 2010-12-18 ENCOUNTER — Other Ambulatory Visit: Payer: Self-pay | Admitting: Family Medicine

## 2010-12-18 DIAGNOSIS — G8929 Other chronic pain: Secondary | ICD-10-CM

## 2010-12-18 MED ORDER — OXYCODONE-ACETAMINOPHEN 5-325 MG PO TABS: ORAL_TABLET | ORAL | Status: DC

## 2010-12-18 NOTE — Telephone Encounter (Signed)
LOV 09/28/10 Last refill 11/21/10

## 2010-12-18 NOTE — Telephone Encounter (Signed)
Refill x1 

## 2010-12-18 NOTE — Telephone Encounter (Signed)
Left message on machine that rx is ready to pick up. 

## 2011-01-10 ENCOUNTER — Other Ambulatory Visit: Payer: Self-pay | Admitting: Family Medicine

## 2011-01-10 DIAGNOSIS — E785 Hyperlipidemia, unspecified: Secondary | ICD-10-CM

## 2011-01-11 ENCOUNTER — Other Ambulatory Visit: Payer: Self-pay | Admitting: Family Medicine

## 2011-01-11 ENCOUNTER — Other Ambulatory Visit (INDEPENDENT_AMBULATORY_CARE_PROVIDER_SITE_OTHER): Payer: Medicare Other

## 2011-01-11 DIAGNOSIS — E785 Hyperlipidemia, unspecified: Secondary | ICD-10-CM

## 2011-01-11 LAB — HEPATIC FUNCTION PANEL
ALT: 12 U/L (ref 0–35)
AST: 21 U/L (ref 0–37)
Albumin: 4.1 g/dL (ref 3.5–5.2)
Total Bilirubin: 0.7 mg/dL (ref 0.3–1.2)
Total Protein: 7.1 g/dL (ref 6.0–8.3)

## 2011-01-11 LAB — LIPID PANEL
Cholesterol: 164 mg/dL (ref 0–200)
HDL: 47.7 mg/dL (ref >39.00)
Triglycerides: 130 mg/dL (ref 0.0–149.0)
VLDL: 26 mg/dL (ref 0.0–40.0)

## 2011-01-11 NOTE — Progress Notes (Signed)
Labs only

## 2011-01-11 NOTE — Telephone Encounter (Signed)
Order # [id number] and will be available in 7-10 business days...       KP

## 2011-01-11 NOTE — Telephone Encounter (Signed)
Seen 09/28/10 and filled 09/06/10 please advise     KP

## 2011-01-12 ENCOUNTER — Ambulatory Visit: Payer: Medicare Other | Admitting: Gastroenterology

## 2011-01-12 NOTE — Telephone Encounter (Signed)
Faxed.   KP 

## 2011-01-15 ENCOUNTER — Other Ambulatory Visit: Payer: Self-pay

## 2011-01-15 DIAGNOSIS — G8929 Other chronic pain: Secondary | ICD-10-CM

## 2011-01-15 DIAGNOSIS — K219 Gastro-esophageal reflux disease without esophagitis: Secondary | ICD-10-CM

## 2011-01-15 MED ORDER — OXYCODONE-ACETAMINOPHEN 5-325 MG PO TABS: ORAL_TABLET | ORAL | Status: DC

## 2011-01-15 MED ORDER — ZOLPIDEM TARTRATE 10 MG PO TABS: 10.0000 mg | ORAL_TABLET | Freq: Every evening | ORAL | Status: DC | PRN

## 2011-01-15 MED ORDER — PRAVASTATIN SODIUM 20 MG PO TABS: 20.0000 mg | ORAL_TABLET | Freq: Every evening | ORAL | Status: DC

## 2011-01-15 NOTE — Telephone Encounter (Signed)
Faxed and patient aware to pick up Rx     KP

## 2011-01-15 NOTE — Telephone Encounter (Signed)
Discussed labs with patient --Rx faxed.... Patient also Request samples of Astepro, Percocet last filled 12/18/10 and Ambien 01/11/11 and seen 09/28/10 please advise    KP       Great! Cont meds-- recheck 6 months----272.4 Lipid, hep ----- Message ----- From: Almeta Monas, CMA Sent: 01/15/2011 1:11 PM To: Lelon Perla, DO  Patient wanted to know if she should continue meds. (pravchol) Please advise KP

## 2011-01-16 ENCOUNTER — Ambulatory Visit: Payer: Medicare Other | Admitting: Gastroenterology

## 2011-01-18 ENCOUNTER — Ambulatory Visit: Payer: Medicare Other | Admitting: Family Medicine

## 2011-01-24 ENCOUNTER — Ambulatory Visit: Payer: Medicare Other | Admitting: Gastroenterology

## 2011-01-24 ENCOUNTER — Telehealth: Payer: Self-pay

## 2011-01-24 NOTE — Telephone Encounter (Signed)
Pristiq came in from mail order--- Msg left to call the office to pick up    KP

## 2011-01-25 NOTE — Telephone Encounter (Signed)
Pt will pick up medicine in the morning

## 2011-01-29 ENCOUNTER — Encounter: Payer: Self-pay | Admitting: Gastroenterology

## 2011-01-29 ENCOUNTER — Ambulatory Visit (INDEPENDENT_AMBULATORY_CARE_PROVIDER_SITE_OTHER): Payer: Medicare Other | Admitting: Gastroenterology

## 2011-01-29 VITALS — BP 142/74 | HR 68 | Ht 68.0 in | Wt 177.0 lb

## 2011-01-29 DIAGNOSIS — K222 Esophageal obstruction: Secondary | ICD-10-CM

## 2011-01-29 DIAGNOSIS — R1310 Dysphagia, unspecified: Secondary | ICD-10-CM

## 2011-01-29 DIAGNOSIS — K219 Gastro-esophageal reflux disease without esophagitis: Secondary | ICD-10-CM

## 2011-01-29 DIAGNOSIS — K589 Irritable bowel syndrome without diarrhea: Secondary | ICD-10-CM

## 2011-01-29 MED ORDER — PANTOPRAZOLE SODIUM 40 MG PO TBEC: 40.0000 mg | DELAYED_RELEASE_TABLET | Freq: Every day | ORAL | Status: DC

## 2011-01-29 MED ORDER — ALPRAZOLAM 0.5 MG PO TABS: 0.5000 mg | ORAL_TABLET | Freq: Three times a day (TID) | ORAL | Status: DC | PRN

## 2011-01-29 NOTE — Assessment & Plan Note (Signed)
Plan renewal of Xanax which seems to help her symptoms

## 2011-01-29 NOTE — Assessment & Plan Note (Signed)
Improved following dilatation of an early esophageal stricture.

## 2011-01-29 NOTE — Progress Notes (Signed)
History of Present Illness:  Amanda Joyce has returned following  colonoscopy and upper endoscopy. In July, 2012 colonoscopy demonstrated a small polyp which was removed. Adenomatous changes were seen at pathology. An early distal esophageal stricture was dilated at upper endoscopy. Dysphagia has resolved. Her main complaint is lower abdominal discomfort and occasional episodes of diarrhea. This tends to occur when she is under stress.   Review of Systems: Pertinent positive and negative review of systems were noted in the above HPI section. All other review of systems were otherwise negative.    Current Medications, Allergies, Past Medical History, Past Surgical History, Family History and Social History were reviewed in [employer] electronic medical record  Vital signs were reviewed in today's medical record. Physical Exam: General: Well developed , well nourished, no acute distress

## 2011-01-30 ENCOUNTER — Other Ambulatory Visit: Payer: Self-pay | Admitting: Family Medicine

## 2011-01-30 ENCOUNTER — Telehealth: Payer: Self-pay | Admitting: Gastroenterology

## 2011-01-30 MED ORDER — QUINAPRIL HCL 40 MG PO TABS: 40.0000 mg | ORAL_TABLET | Freq: Every day | ORAL | Status: DC

## 2011-01-30 MED ORDER — HYOSCYAMINE SULFATE 0.125 MG SL SUBL: 0.1250 mg | SUBLINGUAL_TABLET | SUBLINGUAL | Status: AC | PRN

## 2011-01-30 NOTE — Telephone Encounter (Signed)
Called pt to inform medication sent   

## 2011-01-30 NOTE — Telephone Encounter (Signed)
Dr Arlyce Dice, This pt called today I said she thought you was sending in her a script for Sublingual Hyoscyamine.... Is that right? You sent Pantoprazole.. She said that wasn't right  Can I send her Hyoscyamine??

## 2011-01-30 NOTE — Telephone Encounter (Signed)
Faxed.   KP 

## 2011-01-30 NOTE — Telephone Encounter (Signed)
yes

## 2011-02-02 ENCOUNTER — Telehealth: Payer: Self-pay | Admitting: Gastroenterology

## 2011-02-05 MED ORDER — GLYCOPYRROLATE 2 MG PO TABS: 2.0000 mg | ORAL_TABLET | Freq: Two times a day (BID) | ORAL | Status: DC

## 2011-02-05 MED ORDER — GLYCOPYRROLATE 2 MG PO TABS: 2.0000 mg | ORAL_TABLET | Freq: Three times a day (TID) | ORAL | Status: DC

## 2011-02-05 NOTE — Telephone Encounter (Signed)
Dr Arlyce Dice, Pt states Hyomax is  to expensive. What would you like to send in place of Hyomax?

## 2011-02-05 NOTE — Telephone Encounter (Signed)
Robinul forte prn

## 2011-02-05 NOTE — Telephone Encounter (Signed)
Cheaper medication sent to pharmacy  Pt aware

## 2011-02-07 ENCOUNTER — Telehealth: Payer: Self-pay | Admitting: Gastroenterology

## 2011-02-07 NOTE — Telephone Encounter (Signed)
Pharmacy had questions regarding robinul forte prescription. Clarified directions take 1 po twice daily prn. Pharmacy with cancel the rx for the directions of 3 times a day.

## 2011-02-08 ENCOUNTER — Telehealth: Payer: Self-pay | Admitting: Gastroenterology

## 2011-02-08 ENCOUNTER — Other Ambulatory Visit: Payer: Self-pay

## 2011-02-08 DIAGNOSIS — G8929 Other chronic pain: Secondary | ICD-10-CM

## 2011-02-08 NOTE — Telephone Encounter (Signed)
Last seen 09/28/10 and filled 01/15/11 please advise    KP 

## 2011-02-08 NOTE — Telephone Encounter (Signed)
Pt called to let us know that she did not want the robinul forte rx that was sent to the pharmacy.

## 2011-02-09 LAB — COMPREHENSIVE METABOLIC PANEL
ALT: 12
AST: 19
Calcium: 9.6
GFR calc Af Amer: >60
Sodium: 138
Total Protein: 6.4

## 2011-02-09 LAB — APTT: aPTT: 32

## 2011-02-09 LAB — PROTIME-INR: Prothrombin Time: 13.1

## 2011-02-09 LAB — CBC
MCHC: 34.9
Platelets: 272
RBC: 3.92
RDW: 13.1
WBC: 10.9 — ABNORMAL HIGH

## 2011-02-09 MED ORDER — OXYCODONE-ACETAMINOPHEN 5-325 MG PO TABS: ORAL_TABLET | ORAL | Status: DC

## 2011-02-09 NOTE — Telephone Encounter (Signed)
VM left advising RX ready for pick up     KP

## 2011-02-13 LAB — URINE MICROSCOPIC-ADD ON

## 2011-02-13 LAB — COMPREHENSIVE METABOLIC PANEL
ALT: 13
ALT: 13
AST: 19
Albumin: 4.1
Albumin: 4.2
Alkaline Phosphatase: 63
Alkaline Phosphatase: 65
BUN: 13
Calcium: 9.8
Chloride: 105
GFR calc Af Amer: >60
Glucose, Bld: 100 — ABNORMAL HIGH
Glucose, Bld: 121 — ABNORMAL HIGH
Potassium: 4.1
Potassium: 3.7
Sodium: 142
Sodium: 137
Total Bilirubin: 1
Total Protein: 6.6

## 2011-02-13 LAB — PROTIME-INR
INR: 0.9
Prothrombin Time: 12.7

## 2011-02-13 LAB — DIFFERENTIAL
Basophils Relative: 1
Eosinophils Absolute: 0.3
Lymphs Abs: 2
Monocytes Absolute: 0.9
Monocytes Relative: 9
Neutrophils Relative %: 66

## 2011-02-13 LAB — CBC
HCT: 42.5
Hemoglobin: 14.8
Hemoglobin: 14
MCHC: 34.2
Platelets: 339
RDW: 13.4
WBC: 6.3

## 2011-02-13 LAB — CA 125: CA 125: 23.7

## 2011-02-13 LAB — URINALYSIS, ROUTINE W REFLEX MICROSCOPIC
Bilirubin Urine: NEGATIVE
Glucose, UA: NEGATIVE
Specific Gravity, Urine: 1.025
Urobilinogen, UA: 0.2

## 2011-02-13 LAB — OCCULT BLOOD X 1 CARD TO LAB, STOOL: Fecal Occult Bld: NEGATIVE

## 2011-03-09 ENCOUNTER — Other Ambulatory Visit: Payer: Self-pay | Admitting: Family Medicine

## 2011-03-09 NOTE — Telephone Encounter (Signed)
Last seen 09/28/10 and filled 01/15/11 please advise    KP

## 2011-03-10 MED ORDER — ZOLPIDEM TARTRATE 10 MG PO TABS: 10.0000 mg | ORAL_TABLET | Freq: Every evening | ORAL | Status: DC | PRN

## 2011-03-12 MED ORDER — ZOLPIDEM TARTRATE 10 MG PO TABS: 10.0000 mg | ORAL_TABLET | Freq: Every evening | ORAL | Status: DC | PRN

## 2011-03-12 NOTE — Telephone Encounter (Signed)
Addended by: Arnette Norris on: 03/12/2011 12:10 PM   Modules accepted: Orders

## 2011-03-13 ENCOUNTER — Other Ambulatory Visit: Payer: Self-pay

## 2011-03-13 MED ORDER — ZOLPIDEM TARTRATE 10 MG PO TABS: 10.0000 mg | ORAL_TABLET | Freq: Every evening | ORAL | Status: DC | PRN

## 2011-03-13 NOTE — Telephone Encounter (Signed)
Call from patient and she stated she normally gets an Rx for 90 instead of 30 and would like this faxed to [facility]    Please advise    KP

## 2011-03-13 NOTE — Telephone Encounter (Signed)
Rx faxed and patient made aware     KP 

## 2011-03-15 ENCOUNTER — Other Ambulatory Visit: Payer: Self-pay | Admitting: Family Medicine

## 2011-03-15 DIAGNOSIS — G8929 Other chronic pain: Secondary | ICD-10-CM

## 2011-03-15 MED ORDER — OXYCODONE-ACETAMINOPHEN 5-325 MG PO TABS: ORAL_TABLET | ORAL | Status: DC

## 2011-03-15 NOTE — Telephone Encounter (Signed)
Refill oxycodone 5-325 mg - patient wants to pick up Friday [id number]

## 2011-03-15 NOTE — Telephone Encounter (Signed)
Last seen 09/28/10 and filled 02/08/11 please advise     KP

## 2011-03-15 NOTE — Telephone Encounter (Signed)
msg left advising Rx has been approved and ready for pick up   KP

## 2011-04-05 ENCOUNTER — Other Ambulatory Visit: Payer: Self-pay | Admitting: Internal Medicine

## 2011-04-06 ENCOUNTER — Other Ambulatory Visit: Payer: Self-pay | Admitting: Internal Medicine

## 2011-04-17 ENCOUNTER — Other Ambulatory Visit: Payer: Self-pay | Admitting: Family Medicine

## 2011-04-17 DIAGNOSIS — G8929 Other chronic pain: Secondary | ICD-10-CM

## 2011-04-17 MED ORDER — OXYCODONE-ACETAMINOPHEN 5-325 MG PO TABS: ORAL_TABLET | ORAL | Status: DC

## 2011-04-17 NOTE — Telephone Encounter (Signed)
Last seen 09/28/10 and filled 03/15/11. Please advise     KP

## 2011-04-17 NOTE — Telephone Encounter (Signed)
Refill oxycodone / acet amino 5-325 mg - patient will pick up Wednesday [id number]

## 2011-04-24 ENCOUNTER — Other Ambulatory Visit: Payer: Self-pay | Admitting: Family Medicine

## 2011-04-24 DIAGNOSIS — J069 Acute upper respiratory infection, unspecified: Secondary | ICD-10-CM

## 2011-04-24 MED ORDER — IPRATROPIUM BROMIDE 0.06 % NA SOLN: 2.0000 | Freq: Three times a day (TID) | NASAL | Status: DC

## 2011-04-24 NOTE — Telephone Encounter (Signed)
Please advise      KP 

## 2011-04-24 NOTE — Telephone Encounter (Signed)
Ok to refill.  i sent it,

## 2011-04-25 NOTE — Telephone Encounter (Signed)
Pristiq ordered confirmation # [id number].

## 2011-05-03 ENCOUNTER — Other Ambulatory Visit: Payer: Self-pay | Admitting: Family Medicine

## 2011-05-11 ENCOUNTER — Telehealth: Payer: Self-pay | Admitting: *Deleted

## 2011-05-11 NOTE — Telephone Encounter (Signed)
No abx without ov---can give nasal spray and advise plain antihistamine otc only

## 2011-05-11 NOTE — Telephone Encounter (Signed)
Pt requesting ABX for sinusitis [no fever, SOB-confined to sinuses], states "she knows this is sinus infection" and has made this request in past w/MD. Request ABX to be sent to [facility].

## 2011-05-11 NOTE — Telephone Encounter (Signed)
Discussed with patient and she did not want and OV. I explained that we can not treat her with an Abx without seeing her first, I advised of recommendations from [facility] and patient said she would wait until Monday. I also advised Saturday clinic is available and gave her the hours and address. Apt scheduled for Monday at 245.

## 2011-05-14 ENCOUNTER — Encounter: Payer: Self-pay | Admitting: Family Medicine

## 2011-05-14 ENCOUNTER — Ambulatory Visit (INDEPENDENT_AMBULATORY_CARE_PROVIDER_SITE_OTHER): Payer: Medicare Other | Admitting: Family Medicine

## 2011-05-14 VITALS — BP 132/72 | HR 57 | Temp 98.8°F | Wt 177.2 lb

## 2011-05-14 DIAGNOSIS — M545 Low back pain, unspecified: Secondary | ICD-10-CM

## 2011-05-14 DIAGNOSIS — G8929 Other chronic pain: Secondary | ICD-10-CM

## 2011-05-14 DIAGNOSIS — J329 Chronic sinusitis, unspecified: Secondary | ICD-10-CM

## 2011-05-14 DIAGNOSIS — I10 Essential (primary) hypertension: Secondary | ICD-10-CM

## 2011-05-14 MED ORDER — OXYCODONE-ACETAMINOPHEN 5-325 MG PO TABS: ORAL_TABLET | ORAL | Status: DC

## 2011-05-14 MED ORDER — DILTIAZEM HCL ER COATED BEADS 240 MG PO CP24: 240.0000 mg | ORAL_CAPSULE | Freq: Every day | ORAL | Status: DC

## 2011-05-14 MED ORDER — DOXYCYCLINE HYCLATE 100 MG PO TABS: 100.0000 mg | ORAL_TABLET | Freq: Two times a day (BID) | ORAL | Status: AC

## 2011-05-14 NOTE — Progress Notes (Signed)
  Subjective:     Amanda Joyce is a 71 y.o. female who presents for evaluation of sinus pain. Symptoms include: congestion, facial pain, headaches, nasal congestion and sinus pressure. Onset of symptoms was 5 days ago. Symptoms have been gradually worsening since that time. Past history is significant for no history of pneumonia or bronchitis. Patient is a non-smoker.  Pt also c/o R hip and leg pain x a few months.  Percocets help.  No known injury.     The following portions of the patient's history were reviewed and updated as appropriate: allergies, current medications, past family history, past medical history, past social history, past surgical history and problem list.  Review of Systems Pertinent items are noted in HPI.   Objective:    BP 132/72  Pulse 57  Temp(Src) 98.8 F (37.1 C) (Oral)  Wt 177 lb 3.2 oz (80.377 kg)  SpO2 97% General appearance: alert, cooperative, appears stated age and no distress Head: Normocephalic, without obvious abnormality, atraumatic, sinuses tender to percussion Ears: normal TM's and external ear canals both ears Nose: green discharge, moderate congestion, sinus tenderness bilateral Throat: lips, mucosa, and tongue normal; teeth and gums normal Neck: mild anterior cervical adenopathy, no JVD, supple, symmetrical, trachea midline and thyroid not enlarged, symmetric, no tenderness/mass/nodules Lungs: clear to auscultation bilaterally Heart: regular rate and rhythm, S1, S2 normal, no murmur, click, rub or gallop Lymph nodes: Cervical adenopathy: b/l   MS--  DTR  = and b/l          Neg straight leg           Good strength in low ext b/l  Assessment:    Acute bacterial sinusitis.   Low back pain  ---- f/u chiropractor,  Consider repeat xrays ,  And PT vs MRI Plan:    nasal saline dymista abx for 10 days rto prn

## 2011-05-14 NOTE — Patient Instructions (Signed)
Back Pain, Adult Low back pain is very common. About 1 in 5 people have back pain.The cause of low back pain is rarely dangerous. The pain often gets better over time.About half of people with a sudden onset of back pain feel better in just 2 weeks. About 8 in 10 people feel better by 6 weeks.  CAUSES Some common causes of back pain include:  Strain of the muscles or ligaments supporting the spine.   Wear and tear (degeneration) of the spinal discs.   Arthritis.   Direct injury to the back.  DIAGNOSIS Most of the time, the direct cause of low back pain is not known.However, back pain can be treated effectively even when the exact cause of the pain is unknown.Answering your caregiver's questions about your overall health and symptoms is one of the most accurate ways to make sure the cause of your pain is not dangerous. If your caregiver needs more information, he or she may order lab work or imaging tests (X-rays or MRIs).However, even if imaging tests show changes in your back, this usually does not require surgery. HOME CARE INSTRUCTIONS For many people, back pain returns.Since low back pain is rarely dangerous, it is often a condition that people can learn to manageon their own.   Remain active. It is stressful on the back to sit or stand in one place. Do not sit, drive, or stand in one place for more than 30 minutes at a time. Take short walks on level surfaces as soon as pain allows.Try to increase the length of time you walk each day.   Do not stay in bed.Resting more than 1 or 2 days can delay your recovery.   Do not avoid exercise or work.Your body is made to move.It is not dangerous to be active, even though your back may hurt.Your back will likely heal faster if you return to being active before your pain is gone.   Pay attention to your body when you bend and lift. Many people have less discomfortwhen lifting if they bend their knees, keep the load close to their  bodies,and avoid twisting. Often, the most comfortable positions are those that put less stress on your recovering back.   Find a comfortable position to sleep. Use a firm mattress and lie on your side with your knees slightly bent. If you lie on your back, put a pillow under your knees.   Only take over-the-counter or prescription medicines as directed by your caregiver. Over-the-counter medicines to reduce pain and inflammation are often the most helpful.Your caregiver may prescribe muscle relaxant drugs.These medicines help dull your pain so you can more quickly return to your normal activities and healthy exercise.   Put ice on the injured area.   Put ice in a plastic bag.   Place a towel between your skin and the bag.   Leave the ice on for 15 to 20 minutes, 3 to 4 times a day for the first 2 to 3 days. After that, ice and heat may be alternated to reduce pain and spasms.   Ask your caregiver about trying back exercises and gentle massage. This may be of some benefit.   Avoid feeling anxious or stressed.Stress increases muscle tension and can worsen back pain.It is important to recognize when you are anxious or stressed and learn ways to manage it.Exercise is a great option.  SEEK MEDICAL CARE IF:  You have pain that is not relieved with rest or medicine.   You have   pain that does not improve in 1 week.   You have new symptoms.   You are generally not feeling well.  SEEK IMMEDIATE MEDICAL CARE IF:   You have pain that radiates from your back into your legs.   You develop new bowel or bladder control problems.   You have unusual weakness or numbness in your arms or legs.   You develop nausea or vomiting.   You develop abdominal pain.   You feel faint.  Document Released: 04/23/2005 Document Revised: 01/03/2011 Document Reviewed: 09/11/2010 [facility] Patient Information 2012 [place], [place].  Sinusitis Sinuses are air pockets within the bones of your face. The  growth of bacteria within a sinus leads to infection. The infection prevents the sinuses from draining. This infection is called sinusitis. SYMPTOMS  There will be different areas of pain depending on which sinuses have become infected.  The maxillary sinuses often produce pain beneath the eyes.   Frontal sinusitis may cause pain in the middle of the forehead and above the eyes.  Other problems (symptoms) include:  Toothaches.   Colored, pus-like (purulent) drainage from the nose.   Swelling, warmth, and tenderness over the sinus areas may be signs of infection.  TREATMENT  Sinusitis is most often determined by an exam.X-rays may be taken. If x-rays have been taken, make sure you obtain your results or find out how you are to obtain them. Your caregiver may give you medications (antibiotics). These are medications that will help kill the bacteria causing the infection. You may also be given a medication (decongestant) that helps to reduce sinus swelling.  HOME CARE INSTRUCTIONS   Only take over-the-counter or prescription medicines for pain, discomfort, or fever as directed by your caregiver.   Drink extra fluids. Fluids help thin the mucus so your sinuses can drain more easily.   Applying either moist heat or ice packs to the sinus areas may help relieve discomfort.   Use saline nasal sprays to help moisten your sinuses. The sprays can be found at your local drugstore.  SEEK IMMEDIATE MEDICAL CARE IF:  You have a fever.   You have increasing pain, severe headaches, or toothache.   You have nausea, vomiting, or drowsiness.   You develop unusual swelling around the face or trouble seeing.  MAKE SURE YOU:   Understand these instructions.   Will watch your condition.   Will get help right away if you are not doing well or get worse.  Document Released: 04/23/2005 Document Revised: 01/03/2011 Document Reviewed: 11/20/2006 [facility] Patient Information 2012 [place], [place].

## 2011-06-13 ENCOUNTER — Other Ambulatory Visit: Payer: Self-pay | Admitting: Gastroenterology

## 2011-06-14 NOTE — Telephone Encounter (Signed)
ok 

## 2011-06-14 NOTE — Telephone Encounter (Signed)
Dr Arlyce Dice, This patient wants Xanax refills, is it okay

## 2011-06-18 ENCOUNTER — Other Ambulatory Visit: Payer: Self-pay

## 2011-06-18 DIAGNOSIS — G8929 Other chronic pain: Secondary | ICD-10-CM

## 2011-06-18 MED ORDER — OXYCODONE-ACETAMINOPHEN 5-325 MG PO TABS: ORAL_TABLET | ORAL | Status: DC

## 2011-06-18 NOTE — Telephone Encounter (Signed)
Last seen and filled 05/14/11 # 90...Marland KitchenMarland Kitchen please advise    KP

## 2011-07-02 ENCOUNTER — Ambulatory Visit (INDEPENDENT_AMBULATORY_CARE_PROVIDER_SITE_OTHER): Payer: Medicare Other | Admitting: Family Medicine

## 2011-07-02 ENCOUNTER — Encounter: Payer: Self-pay | Admitting: Family Medicine

## 2011-07-02 VITALS — BP 120/72 | HR 68 | Temp 97.8°F | Wt 177.6 lb

## 2011-07-02 DIAGNOSIS — R52 Pain, unspecified: Secondary | ICD-10-CM

## 2011-07-02 DIAGNOSIS — J309 Allergic rhinitis, unspecified: Secondary | ICD-10-CM

## 2011-07-02 DIAGNOSIS — R232 Flushing: Secondary | ICD-10-CM

## 2011-07-02 DIAGNOSIS — R739 Hyperglycemia, unspecified: Secondary | ICD-10-CM

## 2011-07-02 DIAGNOSIS — Z9109 Other allergy status, other than to drugs and biological substances: Secondary | ICD-10-CM

## 2011-07-02 DIAGNOSIS — M25551 Pain in right hip: Secondary | ICD-10-CM

## 2011-07-02 DIAGNOSIS — E785 Hyperlipidemia, unspecified: Secondary | ICD-10-CM

## 2011-07-02 DIAGNOSIS — N951 Menopausal and female climacteric states: Secondary | ICD-10-CM

## 2011-07-02 DIAGNOSIS — R7309 Other abnormal glucose: Secondary | ICD-10-CM

## 2011-07-02 DIAGNOSIS — I10 Essential (primary) hypertension: Secondary | ICD-10-CM

## 2011-07-02 DIAGNOSIS — M25559 Pain in unspecified hip: Secondary | ICD-10-CM

## 2011-07-02 MED ORDER — AZELASTINE-FLUTICASONE 137-50 MCG/ACT NA SUSP: 1.0000 | Freq: Two times a day (BID) | NASAL | Status: DC

## 2011-07-02 MED ORDER — ATENOLOL 50 MG PO TABS: 50.0000 mg | ORAL_TABLET | Freq: Two times a day (BID) | ORAL | Status: DC

## 2011-07-02 MED ORDER — QUINAPRIL HCL 40 MG PO TABS: 40.0000 mg | ORAL_TABLET | Freq: Every day | ORAL | Status: DC

## 2011-07-02 NOTE — Patient Instructions (Signed)

## 2011-07-02 NOTE — Assessment & Plan Note (Signed)
Increase mobic to 15 mg qd  Heat/ ice, rest Consider xrays/ Pt if no better

## 2011-07-02 NOTE — Progress Notes (Signed)
  Subjective:    Patient ID: Brettney Ficken, female    DOB: 08/25/1940, 71 y.o.   MRN: [id number]  HPI Pt having hot flashes and was on a vita C supplement that oncology recommended and it helped but when she stopped it  It stayed away for a while but recently came back.    Pt states her hip pain is much better than last visit.    Review of Systems As above    Objective:   Physical Exam  Constitutional: She is oriented to person, place, and time. She appears well-developed and well-nourished.  Musculoskeletal: Normal range of motion. She exhibits no edema and no tenderness.  Neurological: She is alert and oriented to person, place, and time.  Psychiatric: She has a normal mood and affect. Her behavior is normal. Judgment and thought content normal.          Assessment & Plan:

## 2011-07-03 ENCOUNTER — Other Ambulatory Visit (INDEPENDENT_AMBULATORY_CARE_PROVIDER_SITE_OTHER): Payer: Medicare Other

## 2011-07-03 DIAGNOSIS — N951 Menopausal and female climacteric states: Secondary | ICD-10-CM

## 2011-07-03 DIAGNOSIS — R232 Flushing: Secondary | ICD-10-CM

## 2011-07-03 DIAGNOSIS — R7309 Other abnormal glucose: Secondary | ICD-10-CM

## 2011-07-03 DIAGNOSIS — I10 Essential (primary) hypertension: Secondary | ICD-10-CM

## 2011-07-03 DIAGNOSIS — R739 Hyperglycemia, unspecified: Secondary | ICD-10-CM

## 2011-07-03 DIAGNOSIS — E785 Hyperlipidemia, unspecified: Secondary | ICD-10-CM

## 2011-07-03 DIAGNOSIS — R52 Pain, unspecified: Secondary | ICD-10-CM

## 2011-07-03 LAB — BASIC METABOLIC PANEL
Chloride: 102 mEq/L (ref 96–112)
GFR: 79.75 mL/min (ref >60.00)
Potassium: 4 mEq/L (ref 3.5–5.1)
Sodium: 138 mEq/L (ref 135–145)

## 2011-07-03 LAB — CBC WITH DIFFERENTIAL/PLATELET
Basophils Relative: 0.2 % (ref 0.0–3.0)
Eosinophils Relative: 3.8 % (ref 0.0–5.0)
HCT: 43.2 % (ref 36.0–46.0)
Hemoglobin: 14.5 g/dL (ref 12.0–15.0)
Lymphs Abs: 2.5 10*3/uL (ref 0.7–4.0)
MCV: 89.8 fl (ref 78.0–100.0)
Monocytes Absolute: 0.5 10*3/uL (ref 0.1–1.0)
Monocytes Relative: 6.6 % (ref 3.0–12.0)
Platelets: 280 10*3/uL (ref 150.0–400.0)
RBC: 4.81 Mil/uL (ref 3.87–5.11)
WBC: 7.2 10*3/uL (ref 4.5–10.5)

## 2011-07-03 LAB — LIPID PANEL
Cholesterol: 179 mg/dL (ref 0–200)
LDL Cholesterol: 103 mg/dL — ABNORMAL HIGH (ref 0–99)
Total CHOL/HDL Ratio: 4
VLDL: 27.8 mg/dL (ref 0.0–40.0)

## 2011-07-03 LAB — HEPATIC FUNCTION PANEL
ALT: 14 U/L (ref 0–35)
AST: 18 U/L (ref 0–37)
Total Bilirubin: 0.9 mg/dL (ref 0.3–1.2)
Total Protein: 6.9 g/dL (ref 6.0–8.3)

## 2011-07-03 LAB — HEMOGLOBIN A1C: Hgb A1c MFr Bld: 6.1 % (ref 4.6–6.5)

## 2011-07-03 LAB — TSH: TSH: 1.66 u[IU]/mL (ref 0.35–5.50)

## 2011-07-10 ENCOUNTER — Other Ambulatory Visit: Payer: Self-pay | Admitting: Family Medicine

## 2011-07-11 ENCOUNTER — Telehealth: Payer: Self-pay | Admitting: Family Medicine

## 2011-07-11 DIAGNOSIS — M25559 Pain in unspecified hip: Secondary | ICD-10-CM

## 2011-07-11 NOTE — Telephone Encounter (Signed)
Please advise      KP 

## 2011-07-11 NOTE — Telephone Encounter (Signed)
Xray of leg is not appropriate unless something has changed----we can do xray r hip

## 2011-07-11 NOTE — Telephone Encounter (Signed)
Patient states her leg is still bothering her and would like [place] to set up an appt for an xray. Patient would like this done next week in the late afternoon, and not on Tuesday.

## 2011-07-12 NOTE — Telephone Encounter (Signed)
msg left to call the office     KP 

## 2011-07-13 NOTE — Telephone Encounter (Signed)
complete

## 2011-07-13 NOTE — Telephone Encounter (Signed)
I discussed with patient and she agreed to do the right hip X-ray. Which order did you want, will it be complete or 1 view?      KP

## 2011-07-17 ENCOUNTER — Ambulatory Visit (INDEPENDENT_AMBULATORY_CARE_PROVIDER_SITE_OTHER)
Admission: RE | Admit: 2011-07-17 | Discharge: 2011-07-17 | Disposition: A | Discharge status: Home / Self Care | Payer: Medicare Other | Source: Ambulatory Visit | Attending: Family Medicine | Admitting: Family Medicine

## 2011-07-17 DIAGNOSIS — M25559 Pain in unspecified hip: Secondary | ICD-10-CM

## 2011-07-18 ENCOUNTER — Other Ambulatory Visit: Payer: Self-pay

## 2011-07-18 MED ORDER — ZOLPIDEM TARTRATE 10 MG PO TABS: 10.0000 mg | ORAL_TABLET | Freq: Every evening | ORAL | Status: DC | PRN

## 2011-07-18 NOTE — Telephone Encounter (Signed)
Last seen 07/02/11 and filled 03/13/11 # 90. Please advise     KP

## 2011-07-20 ENCOUNTER — Telehealth: Payer: Self-pay | Admitting: Family Medicine

## 2011-07-20 DIAGNOSIS — G8929 Other chronic pain: Secondary | ICD-10-CM

## 2011-07-20 NOTE — Telephone Encounter (Signed)
Last seen 07/02/11 and filled 06/18/11 # 90. Please advise   KP

## 2011-07-20 NOTE — Telephone Encounter (Signed)
Patient called & would like to pick up her prescription for   Oxycodone-Acetaminophen (Tab) 5-325 MG  Qty 90 1-2 tablets by mouth twice a day as needed

## 2011-07-20 NOTE — Telephone Encounter (Signed)
Ok to refill 

## 2011-07-23 MED ORDER — OXYCODONE-ACETAMINOPHEN 5-325 MG PO TABS
ORAL_TABLET | ORAL | Status: DC
Start: 2011-07-23 — End: 2011-08-24

## 2011-07-23 NOTE — Telephone Encounter (Signed)
Dr.Lowne approved this Rx for Oxycodone # 90.  will you sign it in her absence?   Thank You

## 2011-07-23 NOTE — Telephone Encounter (Signed)
yes

## 2011-07-23 NOTE — Telephone Encounter (Signed)
Rx left at check in and patient has been made aware   KP

## 2011-07-25 ENCOUNTER — Other Ambulatory Visit: Payer: Self-pay | Admitting: Family Medicine

## 2011-07-25 NOTE — Telephone Encounter (Signed)
Last seen 07/02/11 and filled 05/03/11 # 90. Please advise     KP

## 2011-08-23 ENCOUNTER — Telehealth: Payer: Self-pay | Admitting: Family Medicine

## 2011-08-23 DIAGNOSIS — G8929 Other chronic pain: Secondary | ICD-10-CM

## 2011-08-23 NOTE — Telephone Encounter (Signed)
Ok for #90, no refills 

## 2011-08-23 NOTE — Telephone Encounter (Signed)
Last OV 07-02-11, last filled 07-23-11 #90

## 2011-08-23 NOTE — Telephone Encounter (Signed)
Patient would like to pick up her monthly supply of Oxycodone 5-325mg . Please call home # when ready for pick up.

## 2011-08-24 MED ORDER — OXYCODONE-ACETAMINOPHEN 5-325 MG PO TABS: ORAL_TABLET | ORAL | Status: DC

## 2011-08-24 NOTE — Telephone Encounter (Signed)
Placed RX up front for pick up, called pt to advise rx is ready for pick up

## 2011-09-20 ENCOUNTER — Other Ambulatory Visit: Payer: Self-pay | Admitting: Family Medicine

## 2011-09-20 DIAGNOSIS — G8929 Other chronic pain: Secondary | ICD-10-CM

## 2011-09-20 MED ORDER — OXYCODONE-ACETAMINOPHEN 5-325 MG PO TABS: ORAL_TABLET | ORAL | Status: DC

## 2011-09-20 NOTE — Telephone Encounter (Signed)
Ok to refill meds x 1

## 2011-09-20 NOTE — Telephone Encounter (Signed)
Patient called and needs refill for Oxycodone-Acetaminophen (Tab) 5-325 Last written 4.19.13, qty 90,  1-2 tablets by mouth twice a day as needed  Last OV 2.25.13 Patient ph# [phone] when ready for pick up

## 2011-09-20 NOTE — Telephone Encounter (Signed)
Please advise      KP 

## 2011-09-20 NOTE — Telephone Encounter (Signed)
Patient aware Rx ready for pick up.      KP 

## 2011-10-24 ENCOUNTER — Other Ambulatory Visit: Payer: Self-pay | Admitting: Family Medicine

## 2011-10-24 DIAGNOSIS — G8929 Other chronic pain: Secondary | ICD-10-CM

## 2011-10-24 NOTE — Telephone Encounter (Signed)
Refill for  PERCOCET 5-325 MG 1-2 tablets by mouth twice a day as needed Call when ready for pick up [phone] Last ov 2.25.13

## 2011-10-24 NOTE — Telephone Encounter (Signed)
Last seen 07/02/11 and filled 09/20/11 #90. Please advise      KP

## 2011-10-24 NOTE — Telephone Encounter (Signed)
Refill x1 

## 2011-10-25 MED ORDER — OXYCODONE-ACETAMINOPHEN 5-325 MG PO TABS: ORAL_TABLET | ORAL | Status: DC

## 2011-10-25 NOTE — Telephone Encounter (Signed)
Patient made aware Rx ready for pick up.     KP 

## 2011-10-29 ENCOUNTER — Telehealth: Payer: Self-pay | Admitting: Family Medicine

## 2011-10-29 NOTE — Telephone Encounter (Signed)
Patient call & stated to get medication for free she needs for [place] to call in her Pristique  Patient id [id number]  Ph# [phone]  Can call when product comes in at [phone]

## 2011-10-30 ENCOUNTER — Other Ambulatory Visit: Payer: Self-pay | Admitting: *Deleted

## 2011-10-30 ENCOUNTER — Other Ambulatory Visit: Payer: Self-pay

## 2011-10-30 ENCOUNTER — Telehealth: Payer: Self-pay | Admitting: Oncology

## 2011-10-30 DIAGNOSIS — Z853 Personal history of malignant neoplasm of breast: Secondary | ICD-10-CM

## 2011-10-30 NOTE — Telephone Encounter (Signed)
S/w the pt and she is aware of her mammo appt in august

## 2011-10-30 NOTE — Telephone Encounter (Signed)
error 

## 2011-10-30 NOTE — Telephone Encounter (Signed)
Order confirmation # [id number]

## 2011-11-05 ENCOUNTER — Other Ambulatory Visit: Payer: Self-pay | Admitting: Family Medicine

## 2011-11-05 NOTE — Telephone Encounter (Signed)
Last seen 07/02/11 and filled 07/18/11 # 90 please advise    KP

## 2011-11-06 MED ORDER — ZOLPIDEM TARTRATE 10 MG PO TABS: 10.0000 mg | ORAL_TABLET | Freq: Every evening | ORAL | Status: DC | PRN

## 2011-11-06 NOTE — Addendum Note (Signed)
Addended by: Arnette Norris on: 11/06/2011 11:17 AM   Modules accepted: Orders

## 2011-11-06 NOTE — Telephone Encounter (Signed)
VM left advising Rx ready for pick up at the office.     KP 

## 2011-11-27 ENCOUNTER — Telehealth: Payer: Self-pay | Admitting: Family Medicine

## 2011-11-27 DIAGNOSIS — G8929 Other chronic pain: Secondary | ICD-10-CM

## 2011-11-27 NOTE — Telephone Encounter (Signed)
Last seen 07/02/11 and filled 10/25/11 # 90. Please advise     KP

## 2011-11-27 NOTE — Telephone Encounter (Signed)
Rx request for oxycodone 5-325mg  #90. Call [phone] when ready

## 2011-11-28 MED ORDER — OXYCODONE-ACETAMINOPHEN 5-325 MG PO TABS: ORAL_TABLET | ORAL | Status: DC

## 2011-11-28 NOTE — Telephone Encounter (Signed)
patient aware Rx ready for pick up.    KP 

## 2011-11-28 NOTE — Telephone Encounter (Signed)
Refill x1 

## 2011-12-06 ENCOUNTER — Ambulatory Visit
Admission: RE | Admit: 2011-12-06 | Discharge: 2011-12-06 | Disposition: A | Discharge status: Home / Self Care | Payer: Medicare Other | Source: Ambulatory Visit | Attending: Oncology | Admitting: Oncology

## 2011-12-06 DIAGNOSIS — Z853 Personal history of malignant neoplasm of breast: Secondary | ICD-10-CM

## 2011-12-10 ENCOUNTER — Other Ambulatory Visit (HOSPITAL_BASED_OUTPATIENT_CLINIC_OR_DEPARTMENT_OTHER): Payer: Medicare Other | Admitting: Lab

## 2011-12-10 DIAGNOSIS — C50919 Malignant neoplasm of unspecified site of unspecified female breast: Secondary | ICD-10-CM

## 2011-12-10 DIAGNOSIS — Z170 Estrogen receptor positive status [ER+]: Secondary | ICD-10-CM

## 2011-12-10 LAB — CBC WITH DIFFERENTIAL/PLATELET
BASO%: 2.4 % — ABNORMAL HIGH (ref 0.0–2.0)
EOS%: 4.7 % (ref 0.0–7.0)
Eosinophils Absolute: 0.3 10*3/uL (ref 0.0–0.5)
MCH: 30.5 pg (ref 25.1–34.0)
MCHC: 34.1 g/dL (ref 31.5–36.0)
MCV: 89.6 fL (ref 79.5–101.0)
MONO%: 6.5 % (ref 0.0–14.0)
NEUT#: 4.3 10*3/uL (ref 1.5–6.5)
RBC: 4.76 10*6/uL (ref 3.70–5.45)
RDW: 13.3 % (ref 11.2–14.5)

## 2011-12-11 LAB — COMPREHENSIVE METABOLIC PANEL
AST: 16 U/L (ref 0–37)
Albumin: 4.4 g/dL (ref 3.5–5.2)
Alkaline Phosphatase: 53 U/L (ref 39–117)
BUN: 16 mg/dL (ref 6–23)
Glucose, Bld: 127 mg/dL — ABNORMAL HIGH (ref 70–99)
Potassium: 4.4 mEq/L (ref 3.5–5.3)
Total Bilirubin: 0.6 mg/dL (ref 0.3–1.2)

## 2011-12-13 ENCOUNTER — Other Ambulatory Visit: Payer: Medicare Other | Admitting: Lab

## 2011-12-17 ENCOUNTER — Telehealth: Payer: Self-pay | Admitting: Oncology

## 2011-12-17 ENCOUNTER — Ambulatory Visit (HOSPITAL_BASED_OUTPATIENT_CLINIC_OR_DEPARTMENT_OTHER): Payer: Medicare Other | Admitting: Oncology

## 2011-12-17 VITALS — BP 160/77 | HR 61 | Temp 97.8°F | Resp 20 | Ht 68.0 in | Wt 177.7 lb

## 2011-12-17 DIAGNOSIS — C50919 Malignant neoplasm of unspecified site of unspecified female breast: Secondary | ICD-10-CM | POA: Insufficient documentation

## 2011-12-17 DIAGNOSIS — R5381 Other malaise: Secondary | ICD-10-CM

## 2011-12-17 NOTE — Progress Notes (Signed)
ID: TAYEN NARANG   DOB: [birth year]  MR#: [id number]  WUJ#:[id number]  HISTORY OF PRESENT ILLNESS: Eulalie palpated a lump in her left breast in early 2006.  This was evaluated with mammography which showed indeed a suspicious lesion (I do not have any radiology records) and lead to biopsy of the left breast mass on May 23, 2004.    The pathology report (Excision No. [id number] at [facility] in [place], [place]) showed an infiltrating ductal carcinoma, grade 2, with a small percentage in situ component.  No evidence of lymphovascular invasion.  Strongly positive for ER and PR, the Hercept testing being 1+ and the HER-2 by FISH being in the equivocal range with a ratio of 1.9.    With this information, the patient proceeded to definitive surgical removal of the tumor under Dr. Hessie Diener on June 15, 2004.  This was a left lumpectomy and axillary lymph node dissection.  The final pathology ([id number]) showed a 4 cm infiltrating ductal carcinoma, grade 2, with probable lymphatic invasion, but more importantly, with 3 out of 16 lymph nodes involved, positive for metastatic carcinoma, including focal extracapsular extension.  There was a positive margin (inferior) and this was cleared by further surgery on June 26, 2004 ([id number]).  The patient was then staged with no evidence of metastatic disease and was treated with dose-dense Cytoxan and Adriamycin followed by dose-dense Taxol, all by standard protocol to the best of my ability to tell from the records and from the patient.  It was followed by radiation (I do not have the radiation records) completed in October of 2006.  Her subsequent history is as detailed below    INTERVAL HISTORY: Charletta returns for followup of her breast cancer. Interval history generally is unremarkable. She is no longer seeing her family in [place] chiefly because she hates to fly. She hasn't been there in 3 years. 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  REVIEW OF SYSTEMS: She still having significant problems with hot flashes. She's tried soybean for this which did not work. She is reluctant to try progesterone or blood pressure medications such as Catapres, and she is a ready and other antidepressants. She feels fatigued, and tends to take a nap most afternoons right after lunch. She hurts "all over" and takes Vicodin once or twice daily on a chronic basis. This does not cause her to be constipated. A detailed review of systems was otherwise entirely negative.  PAST MEDICAL HISTORY: Past Medical History  Diagnosis Date  . Arthritis   . Hypertension   . Depression   . Gout   . Temporomandibular joint disorders, unspecified   . Personal history of malignant neoplasm of breast   . Breast cancer     Left   . Osteoarthrosis, unspecified whether generalized or localized, unspecified site   . Irritable bowel syndrome   . Nontoxic uninodular goiter   . Hyperlipemia   . Hiatal hernia   . GERD (gastroesophageal reflux disease)   Significant in addition to the above for the patient being status post right thyroidectomy at age 24.  She also underwent ultrasound-guided thyroid nodule fine-needle aspiration on Sep 13, 2004.  I do not have the pathology report from that, but according to the patient, everything was benign. Other medical problems include status post appendectomy, status post right salpingo-oophorectomy in the patient's mid-twenties for an  ovarian cyst, status post left herniorrhaphy, history of irritable bowel syndrome and a history of hypertension.    PAST SURGICAL HISTORY: Past Surgical History  Procedure Date  . Appendectomy   . Hernia repair   . Abdominal hysterectomy   . Thyroid surgery   . Breast lumpectomy     Left breast X2    FAMILY HISTORY Family History  Problem Relation  Age of Onset  . Coronary artery disease Father   . Liver cancer Paternal Uncle   . Alcohol abuse Paternal Uncle   . Colon cancer Neg Hx   The patient's father died from a myocardial infarction at the age of 60.  The patient's mother died from "brain cancer" when the patient was 71 years old. She is an only child.  GYNECOLOGIC HISTORY: She is GX, P2.  First pregnancy to term at age 66.  Last menstrual period more than 15 years ago. She took hormones all of those 15 years until her recent diagnosis of breast cancer.    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  She has been "happily divorced" x 2.  Her son, Casimiro Needle, lives in [place].  He buys and sells cars.  The patient moved to this area to live with him.  Her daughter, Wynona Canes, lives with her husband, Tobe Sos, in [place], where the patient's 4 granddaughters reside.  Casimiro Needle has no children.  She and Casimiro Needle do share two dogs (Mini-Australian Shepherds which they used to raise) and two cats.  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     ADVANCED DIRECTIVES: Not in play  HEALTH MAINTENANCE: History  Substance Use Topics  . Smoking status: Never Smoker   . Smokeless tobacco: Never Used  . Alcohol Use: No     Colonoscopy:  PAP:  Bone density:  Lipid panel:  Allergies  Allergen Reactions  . Clarithromycin     REACTION: NIGHTMARES  . Influenza Virus Vaccine Split Nausea And Vomiting  . Penicillins     REACTION: ITCHING    Current Outpatient Prescriptions  Medication Sig Dispense Refill  . ALPRAZolam (XANAX) 0.5 MG tablet TAKE ONE TABLET BY MOUTH THREE TIMES DAILY AS NEEDED FOR SLEEP FOR ANXIETY  50 tablet  1  . atenolol (TENORMIN) 50 MG tablet Take 1 tablet (50 mg total) by mouth 2 (two) times daily.  180 tablet  3  . Azelastine-Fluticasone (DYMISTA) 137-50 MCG/ACT SUSP Place 1 spray into the nose 2 (two) times daily.      Marland Kitchen  desvenlafaxine (PRISTIQ) 100 MG 24 hr tablet Take 100 mg by mouth as directed.        Marland Kitchen ibuprofen (ADVIL,MOTRIN) 800 MG tablet TAKE ONE TABLET BY MOUTH EVERY 6 HOURS AS NEEDED  60 tablet  1  . oxyCODONE-acetaminophen (PERCOCET/ROXICET) 5-325 MG per tablet 1-2 tablets by mouth twice a day as needed  90 tablet  0  . pantoprazole (PROTONIX) 40 MG tablet Take 1 tablet (40 mg total) by mouth daily.  30 tablet  11  . pravastatin (PRAVACHOL) 20 MG tablet TAKE ONE TABLET BY MOUTH IN THE EVENING  30 tablet  5  . quinapril (ACCUPRIL) 40 MG tablet Take 1 tablet (40 mg total) by mouth daily.  90 tablet  3  . zolpidem (AMBIEN) 10 MG tablet Take 1 tablet (10 mg total) by mouth at bedtime as needed for sleep.  90 tablet  0  . carisoprodol (SOMA)  350 MG tablet Take 350 mg by mouth at bedtime as needed.        . celecoxib (CELEBREX) 200 MG capsule Take 200 mg by mouth daily.        . Coenzyme Q10 (COQ10 PO) Take 1 tablet by mouth daily.        . Diclofenac Sodium (PENNSAID) 1.5 % SOLN Place onto the skin. 40 gtts applied to each knee qid as needed       . diltiazem (CARDIZEM CD) 240 MG 24 hr capsule Take 1 capsule (240 mg total) by mouth daily.  90 capsule  3  . glycopyrrolate (ROBINUL-FORTE) 2 MG tablet Take 1 tablet (2 mg total) by mouth 2 (two) times daily. As needed  30 tablet  2  . DISCONTD: zolpidem (AMBIEN) 10 MG tablet Take 1 tablet (10 mg total) by mouth at bedtime as needed.  90 tablet  0    OBJECTIVE: Middle-aged white woman in no acute distress Filed Vitals:   12/17/11 1014  BP: 160/77  Pulse: 61  Temp: 97.8 F (36.6 C)  Resp: 20     Body mass index is 27.02 kg/(m^2).    ECOG FS: 1  Sclerae unicteric Oropharynx clear No cervical or supraclavicular adenopathy Lungs no rales or rhonchi Heart regular rate and rhythm Abd benign MSK no focal spinal tenderness, no peripheral edema Neuro: nonfocal Breasts: The right breast is unremarkable. The left breast is status post lumpectomy and  radiation. There is no evidence of local recurrence.  LAB RESULTS: Lab Results  Component Value Date   WBC 7.1 12/10/2011   NEUTROABS 4.3 12/10/2011   HGB 14.5 12/10/2011   HCT 42.6 12/10/2011   MCV 89.6 12/10/2011   PLT 305 12/10/2011      Chemistry      Component Value Date/Time   NA 139 12/10/2011 1000   K 4.4 12/10/2011 1000   CL 101 12/10/2011 1000   CO2 30 12/10/2011 1000   BUN 16 12/10/2011 1000   CREATININE 0.81 12/10/2011 1000      Component Value Date/Time   CALCIUM 10.4 12/10/2011 1000   ALKPHOS 53 12/10/2011 1000   AST 16 12/10/2011 1000   ALT 11 12/10/2011 1000   BILITOT 0.6 12/10/2011 1000       Lab Results  Component Value Date   LABCA2 24 12/06/2010    No components found with this basename: [id number]    No results found for this basename: INR:1;PROTIME:1 in the last 168 hours  Urinalysis    Component Value Date/Time   COLORURINE yellow 12/20/2009 1506   APPEARANCEUR Hazy 12/20/2009 1506   LABSPEC 1.015 12/20/2009 1506   LABSPEC 1.020 12/24/2005 1645   PHURINE 6.5 12/20/2009 1506   GLUCOSEU NEGATIVE 03/28/2007 1306   HGBUR negative 12/20/2009 1506   HGBUR SMALL* 03/28/2007 1306   BILIRUBINUR neg 10/11/2010   BILIRUBINUR negative 12/20/2009 1506   KETONESUR NEGATIVE 03/28/2007 1306   PROTEINUR NEGATIVE 03/28/2007 1306   UROBILINOGEN 0.2 10/11/2010   UROBILINOGEN 0.2 12/20/2009 1506   NITRITE neg 10/11/2010   NITRITE negative 12/20/2009 1506   LEUKOCYTESUR neg 10/11/2010    STUDIES: Mm Digital Diagnostic Bilat  12/06/2011  *RADIOLOGY REPORT*  Clinical Data:    Status post left lumpectomy, radiation therapy and chemotherapy  for breast cancer in in 2006.  DIGITAL DIAGNOSTIC BILATERAL MAMMOGRAM WITH CAD  Comparison:  Previous examinations.  Findings:  Stable scattered fibroglandular tissue in both breasts. Stable post lumpectomy and postradiation changes on the left.  No new findings suspicious for malignancy in either breast. Mammographic images were processed with CAD.  IMPRESSION: No  evidence of malignancy.  A follow up bilateral screening mammogram is recommended in one year.  BI-RADS CATEGORY 2:  Benign finding(s).  Original Report Authenticated By: Darrol Angel, M.D.    ASSESSMENT: 71 y.o. 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  She was on Arimidex between September 2006 and June 2009 at which time she was switched to tamoxifen, completed June 2012.    PLAN: Bekah is doing great from a breast cancer point of view. I would not be uncomfortable releasing her to Dr. Ernst Spell care, but she tells me she feels comforted and reassured coming here once a year, and we can certainly continue to do that indefinitely. As far as her fatigue is concerned, I think the only thing that will make a difference there is doing increase her activity level. Today I gave her a copy of the flier on the [place] program, but I think anything that gets around the house 3-5 days a week and increases her metabolic rate for about an hour daily will accomplish the same purpose. She knows to call for any problems that may develop before the next visit   Vivia Rosenburg C    12/17/2011

## 2011-12-17 NOTE — Telephone Encounter (Signed)
Mailed the pt her aug 2014 appt calendar °

## 2011-12-17 NOTE — Telephone Encounter (Signed)
Mailed the pt her aug 2014 appt calendar

## 2011-12-26 ENCOUNTER — Telehealth: Payer: Self-pay | Admitting: Family Medicine

## 2011-12-26 DIAGNOSIS — G8929 Other chronic pain: Secondary | ICD-10-CM

## 2011-12-26 MED ORDER — OXYCODONE-ACETAMINOPHEN 5-325 MG PO TABS: ORAL_TABLET | ORAL | Status: DC

## 2011-12-26 NOTE — Telephone Encounter (Signed)
Pt would like rx for oxycodone 5-325mg . Cal home # when ready for pick up.

## 2011-12-26 NOTE — Telephone Encounter (Signed)
Last seen 07/02/11 and filled 11/28/11 # 90. Please advise     KP

## 2011-12-26 NOTE — Telephone Encounter (Signed)
Refill x1 

## 2011-12-26 NOTE — Telephone Encounter (Signed)
Patient aware Rx ready for pick up.      KP 

## 2012-01-07 ENCOUNTER — Other Ambulatory Visit: Payer: Self-pay | Admitting: Family Medicine

## 2012-01-28 ENCOUNTER — Other Ambulatory Visit: Payer: Self-pay | Admitting: Family Medicine

## 2012-01-28 DIAGNOSIS — G8929 Other chronic pain: Secondary | ICD-10-CM

## 2012-01-28 MED ORDER — OXYCODONE-ACETAMINOPHEN 5-325 MG PO TABS: ORAL_TABLET | ORAL | Status: DC

## 2012-01-28 NOTE — Telephone Encounter (Signed)
Refill oxycodone per call from patient  Last ov 2.25.13 Last wrt 8.21.13 #90 NOTE patient has mychart or can call back when ready for pick up at [phone]

## 2012-01-28 NOTE — Telephone Encounter (Signed)
Please advise      KP 

## 2012-01-28 NOTE — Telephone Encounter (Signed)
Refill x1 

## 2012-01-28 NOTE — Telephone Encounter (Signed)
Patient aware Rx ready for pick up.      KP 

## 2012-02-07 ENCOUNTER — Ambulatory Visit (INDEPENDENT_AMBULATORY_CARE_PROVIDER_SITE_OTHER): Payer: Medicare Other | Admitting: Family Medicine

## 2012-02-07 ENCOUNTER — Encounter: Payer: Self-pay | Admitting: Family Medicine

## 2012-02-07 VITALS — BP 134/72 | HR 69 | Temp 98.3°F | Wt 178.6 lb

## 2012-02-07 DIAGNOSIS — R202 Paresthesia of skin: Secondary | ICD-10-CM

## 2012-02-07 DIAGNOSIS — F430 Acute stress reaction: Secondary | ICD-10-CM

## 2012-02-07 DIAGNOSIS — R350 Frequency of micturition: Secondary | ICD-10-CM

## 2012-02-07 DIAGNOSIS — R200 Anesthesia of skin: Secondary | ICD-10-CM

## 2012-02-07 DIAGNOSIS — F439 Reaction to severe stress, unspecified: Secondary | ICD-10-CM

## 2012-02-07 DIAGNOSIS — R209 Unspecified disturbances of skin sensation: Secondary | ICD-10-CM

## 2012-02-07 DIAGNOSIS — E785 Hyperlipidemia, unspecified: Secondary | ICD-10-CM

## 2012-02-07 LAB — POCT URINALYSIS DIPSTICK
Bilirubin, UA: NEGATIVE
Glucose, UA: NEGATIVE
Ketones, UA: NEGATIVE
Spec Grav, UA: 1.015

## 2012-02-07 MED ORDER — PRAVASTATIN SODIUM 20 MG PO TABS: ORAL_TABLET | ORAL | Status: DC

## 2012-02-07 MED ORDER — CIPROFLOXACIN HCL 500 MG PO TABS: 500.0000 mg | ORAL_TABLET | Freq: Two times a day (BID) | ORAL | Status: AC

## 2012-02-07 NOTE — Patient Instructions (Addendum)
Urinary Tract Infection Urinary tract infections (UTIs) can develop anywhere along your urinary tract. Your urinary tract is your body's drainage system for removing wastes and extra water. Your urinary tract includes two kidneys, two ureters, a bladder, and a urethra. Your kidneys are a pair of bean-shaped organs. Each kidney is about the size of your fist. They are located below your ribs, one on each side of your spine. CAUSES Infections are caused by microbes, which are microscopic organisms, including fungi, viruses, and bacteria. These organisms are so small that they can only be seen through a microscope. Bacteria are the microbes that most commonly cause UTIs. SYMPTOMS  Symptoms of UTIs may vary by age and gender of the patient and by the location of the infection. Symptoms in young women typically include a frequent and intense urge to urinate and a painful, burning feeling in the bladder or urethra during urination. Older women and men are more likely to be tired, shaky, and weak and have muscle aches and abdominal pain. A fever may mean the infection is in your kidneys. Other symptoms of a kidney infection include pain in your back or sides below the ribs, nausea, and vomiting. DIAGNOSIS To diagnose a UTI, your caregiver will ask you about your symptoms. Your caregiver also will ask to provide a urine sample. The urine sample will be tested for bacteria and white blood cells. White blood cells are made by your body to help fight infection. TREATMENT  Typically, UTIs can be treated with medication. Because most UTIs are caused by a bacterial infection, they usually can be treated with the use of antibiotics. The choice of antibiotic and length of treatment depend on your symptoms and the type of bacteria causing your infection. HOME CARE INSTRUCTIONS  If you were prescribed antibiotics, take them exactly as your caregiver instructs you. Finish the medication even if you feel better after you  have only taken some of the medication.  Drink enough water and fluids to keep your urine clear or pale yellow.  Avoid caffeine, tea, and carbonated beverages. They tend to irritate your bladder.  Empty your bladder often. Avoid holding urine for long periods of time.  Empty your bladder before and after sexual intercourse.  After a bowel movement, women should cleanse from front to back. Use each tissue only once. SEEK MEDICAL CARE IF:   You have back pain.  You develop a fever.  Your symptoms do not begin to resolve within 3 days. SEEK IMMEDIATE MEDICAL CARE IF:   You have severe back pain or lower abdominal pain.  You develop chills.  You have nausea or vomiting.  You have continued burning or discomfort with urination. MAKE SURE YOU:   Understand these instructions.  Will watch your condition.  Will get help right away if you are not doing well or get worse. Document Released: 01/31/2005 Document Revised: 10/23/2011 Document Reviewed: 06/01/2011 Exit[employer] Patient Information 2013 [employer].  

## 2012-02-07 NOTE — Progress Notes (Signed)
  Subjective:    Amanda Joyce is a 71 y.o. female who complains of back pain and urinary frequency. She has had symptoms for a few days Patient also complains of increase stress at home and tingling in L hand and forearm.  No chest pain.   Patient denies fever. Patient does not have a history of recurrent UTI. Patient does not have a history of pyelonephritis.    Review of Systems As above   Objective:     Filed Vitals:   02/07/12 1453  BP: 134/72  Pulse: 69  Temp: 98.3 F (36.8 C)  TempSrc: Oral  Weight: 178 lb 9.6 oz (81.012 kg)  SpO2: 96%  gen -- aao x3 abd --soft Ms--  Numbness in L hand with pain to elbow that is very mild per pt  Laboratory:  Urine dipstick: trace for hemoglobin, mod for leukocyte esterase and positive for nitrites.   Micro exam: not done.    Assessment:    UTI Numbness L hand and forearm-- she wants to hold off on a referral Increased stress--anxiety--- refer to counseling Hyperlipidemia--  Refill pravachol     Plan:    cipro for 5 days Culture sent

## 2012-02-10 LAB — URINE CULTURE: Colony Count: >=100000

## 2012-02-27 ENCOUNTER — Other Ambulatory Visit: Payer: Self-pay

## 2012-02-27 DIAGNOSIS — G8929 Other chronic pain: Secondary | ICD-10-CM

## 2012-02-27 MED ORDER — OXYCODONE-ACETAMINOPHEN 5-325 MG PO TABS: ORAL_TABLET | ORAL | Status: DC

## 2012-02-27 MED ORDER — ZOLPIDEM TARTRATE 10 MG PO TABS: 10.0000 mg | ORAL_TABLET | Freq: Every evening | ORAL | Status: DC | PRN

## 2012-02-27 NOTE — Telephone Encounter (Signed)
OV 02/07/12. Oxycodone 01/28/12 #90 x0, Zolpidem 11/06/11 #90 x 0.  Plz advise     MW

## 2012-02-28 ENCOUNTER — Ambulatory Visit: Payer: Medicare Other | Admitting: Licensed Clinical Social Worker

## 2012-03-03 NOTE — Telephone Encounter (Signed)
Called and cancelled the RX that was sent to [facility] and faxed it to the [facility] on [address].    KP

## 2012-03-05 ENCOUNTER — Other Ambulatory Visit: Payer: Medicare Other

## 2012-03-06 ENCOUNTER — Other Ambulatory Visit (INDEPENDENT_AMBULATORY_CARE_PROVIDER_SITE_OTHER): Payer: Medicare Other

## 2012-03-06 DIAGNOSIS — N390 Urinary tract infection, site not specified: Secondary | ICD-10-CM

## 2012-03-06 LAB — POCT URINALYSIS DIPSTICK
Bilirubin, UA: NEGATIVE
Ketones, UA: NEGATIVE
Protein, UA: NEGATIVE
pH, UA: 6.5

## 2012-03-08 LAB — URINE CULTURE

## 2012-03-11 ENCOUNTER — Telehealth: Payer: Self-pay | Admitting: Family Medicine

## 2012-03-11 NOTE — Telephone Encounter (Signed)
please call after 4pm today regarding urine sample dropped off last week would like results if available  cb# [phone]

## 2012-03-11 NOTE — Telephone Encounter (Signed)
Patient is aware of results and have been given a cup to recheck sample.     KP

## 2012-03-13 ENCOUNTER — Ambulatory Visit (INDEPENDENT_AMBULATORY_CARE_PROVIDER_SITE_OTHER): Payer: Medicare Other | Admitting: Licensed Clinical Social Worker

## 2012-03-13 DIAGNOSIS — F331 Major depressive disorder, recurrent, moderate: Secondary | ICD-10-CM

## 2012-03-14 LAB — URINE CULTURE: Colony Count: 15000

## 2012-03-20 ENCOUNTER — Ambulatory Visit (INDEPENDENT_AMBULATORY_CARE_PROVIDER_SITE_OTHER): Payer: Medicare Other | Admitting: Licensed Clinical Social Worker

## 2012-03-20 DIAGNOSIS — F331 Major depressive disorder, recurrent, moderate: Secondary | ICD-10-CM

## 2012-03-31 ENCOUNTER — Telehealth: Payer: Self-pay | Admitting: Family Medicine

## 2012-03-31 DIAGNOSIS — G8929 Other chronic pain: Secondary | ICD-10-CM

## 2012-03-31 MED ORDER — OXYCODONE-ACETAMINOPHEN 5-325 MG PO TABS: ORAL_TABLET | ORAL | Status: DC

## 2012-03-31 NOTE — Telephone Encounter (Signed)
msg left advising the patient Rx ready for pick up-- KP

## 2012-03-31 NOTE — Telephone Encounter (Signed)
Patient called requesting refill on oxycodone. Call home # when ready for pick up.

## 2012-03-31 NOTE — Telephone Encounter (Signed)
Refill x1 

## 2012-03-31 NOTE — Telephone Encounter (Signed)
Last seen 02/07/12 and filled 02/27/12 # 90. Please advise     KP

## 2012-04-09 ENCOUNTER — Telehealth: Payer: Self-pay | Admitting: Family Medicine

## 2012-04-09 NOTE — Telephone Encounter (Signed)
Order number #[id number] 7-10 days to receive the order.     KP

## 2012-04-09 NOTE — Telephone Encounter (Signed)
Patient called stating she would like to go ahead and send rx for pristiq to manufacturer. She states Dr. Laury Axon knows what to do.

## 2012-04-10 ENCOUNTER — Ambulatory Visit: Payer: Medicare Other | Admitting: Licensed Clinical Social Worker

## 2012-04-23 ENCOUNTER — Telehealth: Payer: Self-pay | Admitting: Oncology

## 2012-04-23 NOTE — Telephone Encounter (Signed)
S/W THE PT AND SHE IS AWARE OF THE CHANGE IN THE MD'S APPT FROM DR RUBIN TO DR Darnelle Catalan ON 12/18/2012@12 :NOON

## 2012-04-28 ENCOUNTER — Other Ambulatory Visit: Payer: Self-pay | Admitting: *Deleted

## 2012-04-28 DIAGNOSIS — G8929 Other chronic pain: Secondary | ICD-10-CM

## 2012-04-28 NOTE — Telephone Encounter (Signed)
Last filled 03-31-12 # #90, last OV 02-07-12

## 2012-04-29 NOTE — Telephone Encounter (Signed)
Spoke to Pt who states that she would like to wait for Dr Laury Axon to return to get her full Rx. Will forward to PCP

## 2012-04-29 NOTE — Telephone Encounter (Signed)
Left message to call office to advise Pt that she will need to come in to office to sign agreement and pick up Rx and that only # 30 will be given since Dr Laury Axon is out of the office.

## 2012-04-29 NOTE — Telephone Encounter (Signed)
Ok to refill x1 but I wont be able to sign until Monday

## 2012-04-29 NOTE — Telephone Encounter (Signed)
Ok for #30, can get remainder when Dr Laury Axon returns

## 2012-05-05 ENCOUNTER — Telehealth: Payer: Self-pay | Admitting: Family Medicine

## 2012-05-05 NOTE — Telephone Encounter (Signed)
Patient states she has pain in her thumb that is severe and has appt for Thursday with Dr. Beverely Low. She would like to know if we do any injections here or if she would need to go to Ortho.

## 2012-05-06 ENCOUNTER — Encounter: Payer: Self-pay | Admitting: Lab

## 2012-05-06 MED ORDER — OXYCODONE-ACETAMINOPHEN 5-325 MG PO TABS: ORAL_TABLET | ORAL | Status: DC

## 2012-05-06 NOTE — Telephone Encounter (Signed)
Discuss with patient  

## 2012-05-06 NOTE — Telephone Encounter (Signed)
Left message to call office

## 2012-05-06 NOTE — Telephone Encounter (Signed)
Patient returned your call. Also, needs to pick up rx for oxycodone.

## 2012-05-06 NOTE — Telephone Encounter (Signed)
Discuss with patient, Rx printed.

## 2012-05-08 ENCOUNTER — Ambulatory Visit (INDEPENDENT_AMBULATORY_CARE_PROVIDER_SITE_OTHER): Payer: Medicare Other | Admitting: Family Medicine

## 2012-05-08 ENCOUNTER — Encounter: Payer: Self-pay | Admitting: Family Medicine

## 2012-05-08 VITALS — BP 146/60 | HR 65 | Temp 97.6°F | Ht 68.75 in | Wt 184.0 lb

## 2012-05-08 DIAGNOSIS — M109 Gout, unspecified: Secondary | ICD-10-CM

## 2012-05-08 LAB — URIC ACID: Uric Acid, Serum: 3.8 mg/dL (ref 2.4–7.0)

## 2012-05-08 NOTE — Patient Instructions (Addendum)
We'll notify you of your lab results and determine if this is gout Start the Ibuprofen 800mg  3x/day- take w/ food.  Take this until swelling and pain have improved but no longer than 7 days Continue to ice Call with any questions or concerns Hang in there! Happy New Year!

## 2012-05-08 NOTE — Progress Notes (Signed)
  Subjective:    Patient ID: Amanda Joyce, female    DOB: 1940-07-31, 72 y.o.   MRN: [id number]  HPI Thumb pain- L thumb, sxs started Sunday.  Thumb was red and swollen, 'monday it was terrible- i wanted to rip it off'.  Pt is L handed.  Pain is located in the IP joint.  Has hx of arthritis and gout (on problem list, pt not aware).  Unable to manipulate thumb or put pressure on it.  No recent overuse or known injury.  Pt takes oxycodone regular w/ some relief.  Icing.  Taking 800mg  ibuprofen 2x/day.   Review of Systems For ROS see HPI     Objective:   Physical Exam  Vitals reviewed. Constitutional: Amanda Joyce appears well-developed and well-nourished. No distress.  Musculoskeletal:       L 1st IP joint painful and swollen.  No overlying redness or warmth, limited ROM No pain at L MCP joint  Skin: Skin is warm and dry. No erythema.          Assessment & Plan:

## 2012-05-12 ENCOUNTER — Other Ambulatory Visit: Payer: Self-pay | Admitting: *Deleted

## 2012-05-12 DIAGNOSIS — I10 Essential (primary) hypertension: Secondary | ICD-10-CM

## 2012-05-12 MED ORDER — DILTIAZEM HCL ER COATED BEADS 240 MG PO CP24: 240.0000 mg | ORAL_CAPSULE | Freq: Every day | ORAL | Status: DC

## 2012-05-12 NOTE — Telephone Encounter (Signed)
Refill for cardizem sent to [facility]

## 2012-05-13 NOTE — Assessment & Plan Note (Signed)
Unclear if this is dx but pt w/ unilateral pain and swelling of 1 small joint.  Pt w/ hx of similar (according to problem list), although pt not aware of this and had never heard the term before.  Check UA level.  Continued scheduled NSAIDs, ice.  Reviewed supportive care and red flags that should prompt return.  Pt expressed understanding and is in agreement w/ plan.

## 2012-05-14 ENCOUNTER — Telehealth: Payer: Self-pay | Admitting: *Deleted

## 2012-05-14 NOTE — Telephone Encounter (Signed)
Message copied by Verdene Rio on Wed May 14, 2012  3:36 PM ------      Message from: Arvilla Meres P      Created: Wed May 14, 2012  1:52 PM      Contact: [phone]       Pt called requesting recent lab results. They were released on MyChart, however pt does not have access to a computer. Per pt request, MyChart account has been deactivated. Pt can be reached at home [phone] and you may leave a detailed message.

## 2012-05-14 NOTE — Telephone Encounter (Signed)
Discuss with patient   Entered by Sheliah Hatch, MD at 05/08/2012 4:53 PM No evidence of gout- appears to be arthritic inflammation

## 2012-05-16 ENCOUNTER — Telehealth: Payer: Self-pay | Admitting: Family Medicine

## 2012-05-16 NOTE — Telephone Encounter (Signed)
Pt states her diltiazem went from $5 to $68 and she needs [place] to send something stating she needs this medication and try to get it covered. [phone] Rx

## 2012-05-20 NOTE — Telephone Encounter (Signed)
Pt [id number] Call initiated PA awaiting fax.

## 2012-05-21 NOTE — Telephone Encounter (Signed)
Pa faxed awaiting response

## 2012-06-03 MED ORDER — AMLODIPINE BESYLATE 5 MG PO TABS: 5.0000 mg | ORAL_TABLET | Freq: Every day | ORAL | Status: DC

## 2012-06-03 NOTE — Telephone Encounter (Signed)
PA denied. Patient is aware and Dr. Laury Axon advised to switch to Norvasc 5 mg 1 by mouth daily. Na d recheck in 2-3 weeks. That patient voiced understanding and agreed.     KP

## 2012-06-05 ENCOUNTER — Telehealth: Payer: Self-pay | Admitting: Family Medicine

## 2012-06-05 DIAGNOSIS — G8929 Other chronic pain: Secondary | ICD-10-CM

## 2012-06-05 MED ORDER — OXYCODONE-ACETAMINOPHEN 5-325 MG PO TABS: ORAL_TABLET | ORAL | Status: DC

## 2012-06-05 NOTE — Telephone Encounter (Signed)
Patient aware Rx ready for pick up.      KP 

## 2012-06-05 NOTE — Telephone Encounter (Signed)
Last seen 02/07/12 and filled 05/06/12 # 90. Please advise     KP

## 2012-06-05 NOTE — Telephone Encounter (Signed)
Refill x1 

## 2012-06-05 NOTE — Telephone Encounter (Signed)
Patient is requesting a new prescription for oxycodone.  °

## 2012-06-16 ENCOUNTER — Telehealth: Payer: Self-pay | Admitting: Family Medicine

## 2012-06-16 DIAGNOSIS — IMO0002 Reserved for concepts with insufficient information to code with codable children: Secondary | ICD-10-CM

## 2012-06-16 DIAGNOSIS — M1710 Unilateral primary osteoarthritis, unspecified knee: Secondary | ICD-10-CM

## 2012-06-16 MED ORDER — ZOLPIDEM TARTRATE 5 MG PO TABS: 5.0000 mg | ORAL_TABLET | Freq: Every evening | ORAL | Status: DC | PRN

## 2012-06-16 NOTE — Telephone Encounter (Signed)
PT CALLED would like generic for Ambien sent to [facility] on [address] Last wrt 10.23.13 #90

## 2012-06-16 NOTE — Telephone Encounter (Signed)
Change ambien to 5 mg #30  1 po qhs

## 2012-06-16 NOTE — Telephone Encounter (Signed)
Last seen 02/07/12 and filled 04/28/12 # 90.   please advise     KP

## 2012-06-18 ENCOUNTER — Telehealth: Payer: Self-pay | Admitting: Family Medicine

## 2012-06-18 NOTE — Telephone Encounter (Signed)
Patient states she does not know why her rx for Remus Loffler was changed to 5mg  #30 from 10mg  #90. Call back home #

## 2012-06-23 ENCOUNTER — Encounter: Payer: Self-pay | Admitting: Family Medicine

## 2012-06-23 NOTE — Telephone Encounter (Signed)
msg left for a return call.      KP

## 2012-06-24 MED ORDER — DILTIAZEM HCL ER COATED BEADS 240 MG PO CP24: 240.0000 mg | ORAL_CAPSULE | Freq: Every day | ORAL | Status: DC

## 2012-06-24 MED ORDER — ZOLPIDEM TARTRATE 5 MG PO TABS: 5.0000 mg | ORAL_TABLET | Freq: Every evening | ORAL | Status: DC | PRN

## 2012-06-24 NOTE — Telephone Encounter (Signed)
I discussed with the patient that the insurance company has recommended that we change the ambien to the 5 mg due to her age, she voiced understanding. She also stated she normally got an RX for 90 and would like the additional 60 sent to the pharmacy. Please advise     KP   The patient also made me aware that the insurance company has approved her Cardizem 240 1 po qd and she has received it from mail order. She cancelled the Amlodipine with them already. I have updated the medication list and made Dr.Lowne aware.      KP

## 2012-06-24 NOTE — Telephone Encounter (Signed)
Ok to sent extra 60

## 2012-07-01 ENCOUNTER — Ambulatory Visit (INDEPENDENT_AMBULATORY_CARE_PROVIDER_SITE_OTHER): Payer: Medicare Other | Admitting: Family Medicine

## 2012-07-01 ENCOUNTER — Encounter: Payer: Self-pay | Admitting: Family Medicine

## 2012-07-01 ENCOUNTER — Ambulatory Visit (HOSPITAL_BASED_OUTPATIENT_CLINIC_OR_DEPARTMENT_OTHER)
Admission: RE | Admit: 2012-07-01 | Discharge: 2012-07-01 | Disposition: A | Discharge status: Home / Self Care | Payer: Medicare Other | Source: Ambulatory Visit | Attending: Family Medicine | Admitting: Family Medicine

## 2012-07-01 VITALS — BP 132/78 | HR 56 | Temp 98.0°F | Wt 183.0 lb

## 2012-07-01 DIAGNOSIS — M542 Cervicalgia: Secondary | ICD-10-CM | POA: Insufficient documentation

## 2012-07-01 DIAGNOSIS — M25519 Pain in unspecified shoulder: Secondary | ICD-10-CM

## 2012-07-01 DIAGNOSIS — M25512 Pain in left shoulder: Secondary | ICD-10-CM

## 2012-07-01 DIAGNOSIS — I10 Essential (primary) hypertension: Secondary | ICD-10-CM

## 2012-07-01 MED ORDER — QUINAPRIL HCL 40 MG PO TABS: 40.0000 mg | ORAL_TABLET | Freq: Every day | ORAL | Status: DC

## 2012-07-01 MED ORDER — IBUPROFEN 800 MG PO TABS: ORAL_TABLET | ORAL | Status: DC

## 2012-07-01 MED ORDER — CARISOPRODOL 350 MG PO TABS: 350.0000 mg | ORAL_TABLET | Freq: Three times a day (TID) | ORAL | Status: DC | PRN

## 2012-07-01 MED ORDER — ATENOLOL 50 MG PO TABS: 50.0000 mg | ORAL_TABLET | Freq: Two times a day (BID) | ORAL | Status: DC

## 2012-07-01 NOTE — Progress Notes (Signed)
  Subjective:    Amanda Joyce is a 72 y.o. female who presents with left shoulder pain. The symptoms began after falling on the ice. Aggravating factors: fall on ice. Pain is located between the neck and shoulder. Discomfort is described as sharp/stabbing and throbbing. Symptoms are exacerbated by repetitive movements, overhead movements and lying on the shoulder. Evaluation to date: none. Therapy to date includes: ice, avoidance of offending activity and OTC analgesics which are effective.  The following portions of the patient's history were reviewed and updated as appropriate: allergies, current medications, past family history, past medical history, past social history, past surgical history and problem list.  Review of Systems Pertinent items are noted in HPI.   Objective:    BP 132/78  Pulse 56  Temp(Src) 98 F (36.7 C) (Oral)  Wt 183 lb (83.008 kg)  BMI 27.23 kg/m2  SpO2 97% Right shoulder: normal active ROM, no tenderness, no impingement sign  Left shoulder: non-specific diffuse tenderness about the shoulder and full ROM    pain L side c spine as well but pt with from   Assessment:    Left shoulder strain    Plan:    Natural history and expected course discussed. Questions answered. Agricultural engineer distributed. Rest, ice, compression, and elevation (RICE) therapy. NSAIDs per medication orders. Plain film x-rays. muscle relaxer and heat

## 2012-07-01 NOTE — Patient Instructions (Signed)
Shoulder Pain The shoulder is the joint that connects your arms to your body. The bones that form the shoulder joint include the upper arm bone (humerus), the shoulder blade (scapula), and the collarbone (clavicle). The top of the humerus is shaped like a ball and fits into a rather flat socket on the scapula (glenoid cavity). A combination of muscles and strong, fibrous tissues that connect muscles to bones (tendons) support your shoulder joint and hold the ball in the socket. Small, fluid-filled sacs (bursae) are located in different areas of the joint. They act as cushions between the bones and the overlying soft tissues and help reduce friction between the gliding tendons and the bone as you move your arm. Your shoulder joint allows a wide range of motion in your arm. This range of motion allows you to do things like scratch your back or throw a ball. However, this range of motion also makes your shoulder more prone to pain from overuse and injury. Causes of shoulder pain can originate from both injury and overuse and usually can be grouped in the following four categories:  Redness, swelling, and pain (inflammation) of the tendon (tendinitis) or the bursae (bursitis).  Instability, such as a dislocation of the joint.  Inflammation of the joint (arthritis).  Broken bone (fracture). HOME CARE INSTRUCTIONS   Apply ice to the sore area.  Put ice in a plastic bag.  Place a towel between your skin and the bag.  Leave the ice on for 15 to 20 minutes, 3 to 4 times per day for the first 2 days.  If you have a shoulder sling or immobilizer, wear it as long as your caregiver instructs. Only remove it to shower or bathe. Move your arm as little as possible, but keep your hand moving to prevent swelling.  Only take over-the-counter or prescription medicines for pain, discomfort, or fever as directed by your caregiver. SEEK MEDICAL CARE IF:   Your shoulder pain increases, or new pain develops in  your arm, hand, or fingers.  Your hand or fingers become cold and numb.  Your pain is not relieved with medicines. SEEK IMMEDIATE MEDICAL CARE IF:   Your arm, hand, or fingers are numb or tingling.  Your arm, hand, or fingers are significantly swollen or turn white or blue. MAKE SURE YOU:   Understand these instructions.  Will watch your condition.  Will get help right away if you are not doing well or get worse. Document Released: 01/31/2005 Document Revised: 07/16/2011 Document Reviewed: 04/07/2011 Exit[employer] Patient Information 2013 [employer].  

## 2012-07-04 ENCOUNTER — Telehealth: Payer: Self-pay | Admitting: Family Medicine

## 2012-07-04 DIAGNOSIS — G8929 Other chronic pain: Secondary | ICD-10-CM

## 2012-07-04 MED ORDER — IBUPROFEN 800 MG PO TABS: ORAL_TABLET | ORAL | Status: DC

## 2012-07-04 MED ORDER — OXYCODONE-ACETAMINOPHEN 5-325 MG PO TABS: ORAL_TABLET | ORAL | Status: DC

## 2012-07-04 NOTE — Telephone Encounter (Signed)
Patient aware Rx ready for pick up.      KP 

## 2012-07-04 NOTE — Telephone Encounter (Signed)
Ok to refill x 1  

## 2012-07-04 NOTE — Telephone Encounter (Signed)
pt states [facility] not get Ibuprofin - pt also states she needs a refill of $90 for Oxycodone cb# [phone]

## 2012-07-04 NOTE — Telephone Encounter (Signed)
Oxy refill request last seen 07/01/12 and filled 06/05/12 #90. Please advise    KP

## 2012-07-07 ENCOUNTER — Telehealth: Payer: Self-pay | Admitting: Family Medicine

## 2012-07-07 NOTE — Telephone Encounter (Signed)
Patient states we should have received a fax regarding the price of her quinapril. She would like to know what the status of this is. CB# [phone].

## 2012-07-09 NOTE — Telephone Encounter (Signed)
Form given to provider for completion. Pt aware.

## 2012-07-17 ENCOUNTER — Telehealth: Payer: Self-pay | Admitting: Family Medicine

## 2012-07-17 ENCOUNTER — Other Ambulatory Visit: Payer: Self-pay | Admitting: Family Medicine

## 2012-07-17 MED ORDER — ZOLPIDEM TARTRATE 5 MG PO TABS: 5.0000 mg | ORAL_TABLET | Freq: Every evening | ORAL | Status: DC | PRN

## 2012-07-17 NOTE — Telephone Encounter (Signed)
Last seen 07/01/12 and filled 06/24/12 #60. Please advise     KP

## 2012-07-17 NOTE — Telephone Encounter (Signed)
refill AMBIEN 5 MG Take 1 tablet (5 mg total) by mouth at bedtime as needed for sleep.  #90 last fill 10.28.13

## 2012-07-17 NOTE — Telephone Encounter (Signed)
Refill x1 

## 2012-07-21 NOTE — Telephone Encounter (Signed)
PA  re faxed today awaiting response.

## 2012-07-29 ENCOUNTER — Telehealth: Payer: Self-pay | Admitting: *Deleted

## 2012-07-29 NOTE — Telephone Encounter (Signed)
sw pt she is aware of her appt d/t change. gv pt new appts d/t.

## 2012-08-04 NOTE — Telephone Encounter (Signed)
Prior Auth approved 07-04-12 through 05-06-13, approval letter scan to chart.

## 2012-08-05 ENCOUNTER — Telehealth: Payer: Self-pay | Admitting: Family Medicine

## 2012-08-05 DIAGNOSIS — G8929 Other chronic pain: Secondary | ICD-10-CM

## 2012-08-05 DIAGNOSIS — I10 Essential (primary) hypertension: Secondary | ICD-10-CM

## 2012-08-05 NOTE — Telephone Encounter (Signed)
Patient called requesting rx for oxycodone #90. Call [phone] when ready for pick up. Also, patient states that quinapril is now in a lower tier and she would like to get this through mail order.

## 2012-08-06 MED ORDER — OXYCODONE-ACETAMINOPHEN 5-325 MG PO TABS: ORAL_TABLET | ORAL | Status: DC

## 2012-08-06 MED ORDER — QUINAPRIL HCL 40 MG PO TABS: 40.0000 mg | ORAL_TABLET | Freq: Every day | ORAL | Status: DC

## 2012-08-06 NOTE — Telephone Encounter (Signed)
Last seen 07/01/12 and filled 07/04/12 #90. Please advise     KP

## 2012-08-06 NOTE — Telephone Encounter (Signed)
Ok for pain med x1 and bp for mail order

## 2012-08-06 NOTE — Telephone Encounter (Signed)
Patient aware Rx faxed.     KP 

## 2012-08-18 ENCOUNTER — Encounter: Payer: Self-pay | Admitting: Family Medicine

## 2012-08-18 ENCOUNTER — Ambulatory Visit (INDEPENDENT_AMBULATORY_CARE_PROVIDER_SITE_OTHER): Payer: Medicare Other | Admitting: Family Medicine

## 2012-08-18 VITALS — BP 130/80 | HR 75 | Temp 98.9°F | Wt 182.6 lb

## 2012-08-18 DIAGNOSIS — L218 Other seborrheic dermatitis: Secondary | ICD-10-CM

## 2012-08-18 DIAGNOSIS — J019 Acute sinusitis, unspecified: Secondary | ICD-10-CM

## 2012-08-18 DIAGNOSIS — L219 Seborrheic dermatitis, unspecified: Secondary | ICD-10-CM

## 2012-08-18 MED ORDER — SULFAMETHOXAZOLE-TRIMETHOPRIM 800-160 MG PO TABS: 1.0000 | ORAL_TABLET | Freq: Two times a day (BID) | ORAL | Status: DC

## 2012-08-18 MED ORDER — CEFUROXIME AXETIL 500 MG PO TABS: 500.0000 mg | ORAL_TABLET | Freq: Two times a day (BID) | ORAL | Status: AC

## 2012-08-18 MED ORDER — PREDNISONE 10 MG PO TABS: ORAL_TABLET | ORAL | Status: DC

## 2012-08-18 MED ORDER — FLUOCINOLONE ACETONIDE 0.01 % EX SHAM: MEDICATED_SHAMPOO | CUTANEOUS | Status: DC

## 2012-08-18 NOTE — Progress Notes (Signed)
  Subjective:     Amanda Joyce is a 72 y.o. female who presents for evaluation of sinus pain. Symptoms include: congestion, facial pain, headaches, nasal congestion, sinus pressure and ears popping. Onset of symptoms was 1 months ago. Symptoms have been gradually worsening since that time. Past history is significant for no history of pneumonia or bronchitis. Patient is a non-smoker.  The following portions of the patient's history were reviewed and updated as appropriate: allergies, current medications, past family history, past medical history, past social history, past surgical history and problem list.  Review of Systems Pertinent items are noted in HPI.   Objective:    BP 130/80  Pulse 75  Temp(Src) 98.9 F (37.2 C) (Oral)  Wt 182 lb 9.6 oz (82.827 kg)  BMI 27.17 kg/m2  SpO2 96% General appearance: alert, cooperative, appears stated age and no distress Scalp---dry scaly patches Ears: L TM dull and retracted Nose: green discharge, mild congestion, turbinates red, swollen, sinus tenderness bilateral Throat: lips, mucosa, and tongue normal; teeth and gums normal Neck: no adenopathy, supple, symmetrical, trachea midline and thyroid not enlarged, symmetric, no tenderness/mass/nodules Lungs: clear to auscultation bilaterally Heart: S1, S2 normal Extremities: extremities normal, atraumatic, no cyanosis or edema    Assessment:    Acute bacterial sinusitis.    Plan:    Nasal steroids per medication orders. Antihistamines per medication orders. Ceftin per medication orders.

## 2012-08-18 NOTE — Assessment & Plan Note (Signed)
capex shampoo To derm if no better

## 2012-08-18 NOTE — Addendum Note (Signed)
Addended by: Arnette Norris on: 08/18/2012 04:10 PM   Modules accepted: Orders

## 2012-08-18 NOTE — Patient Instructions (Addendum)

## 2012-09-03 ENCOUNTER — Telehealth: Payer: Self-pay | Admitting: Family Medicine

## 2012-09-03 DIAGNOSIS — G8929 Other chronic pain: Secondary | ICD-10-CM

## 2012-09-03 NOTE — Telephone Encounter (Signed)
Last seen 08/18/12 and filled 08/07/10 #90. Please advise     KP

## 2012-09-03 NOTE — Telephone Encounter (Signed)
Pt called about a refill on her oxycodone rx for 90days thanks

## 2012-09-04 MED ORDER — OXYCODONE-ACETAMINOPHEN 5-325 MG PO TABS: ORAL_TABLET | ORAL | Status: DC

## 2012-09-04 NOTE — Telephone Encounter (Signed)
Refill x1 

## 2012-09-09 ENCOUNTER — Telehealth: Payer: Self-pay | Admitting: Family Medicine

## 2012-09-09 NOTE — Telephone Encounter (Signed)
She is taking it every night and I Know Ive discussed this with her----

## 2012-09-09 NOTE — Telephone Encounter (Signed)
Patient called requesting rx for zolpidem #90 be sent to [facility] on [address].

## 2012-09-09 NOTE — Telephone Encounter (Signed)
Last seen 08/18/12 and filled 07/17/12 #60. Please advise     KP

## 2012-09-10 NOTE — Telephone Encounter (Signed)
Spoke with patient and she said you guys have never discussed this in the past, he has an apt on Friday and she will discuss at that time.      KP

## 2012-09-12 ENCOUNTER — Encounter: Payer: Self-pay | Admitting: Family Medicine

## 2012-09-12 ENCOUNTER — Ambulatory Visit (INDEPENDENT_AMBULATORY_CARE_PROVIDER_SITE_OTHER): Payer: Medicare Other | Admitting: Family Medicine

## 2012-09-12 VITALS — BP 138/76 | HR 63 | Temp 98.5°F | Wt 180.8 lb

## 2012-09-12 DIAGNOSIS — S161XXA Strain of muscle, fascia and tendon at neck level, initial encounter: Secondary | ICD-10-CM

## 2012-09-12 DIAGNOSIS — D229 Melanocytic nevi, unspecified: Secondary | ICD-10-CM

## 2012-09-12 DIAGNOSIS — L570 Actinic keratosis: Secondary | ICD-10-CM

## 2012-09-12 DIAGNOSIS — G4700 Insomnia, unspecified: Secondary | ICD-10-CM

## 2012-09-12 DIAGNOSIS — Z5189 Encounter for other specified aftercare: Secondary | ICD-10-CM

## 2012-09-12 DIAGNOSIS — S139XXD Sprain of joints and ligaments of unspecified parts of neck, subsequent encounter: Secondary | ICD-10-CM

## 2012-09-12 DIAGNOSIS — D239 Other benign neoplasm of skin, unspecified: Secondary | ICD-10-CM

## 2012-09-12 DIAGNOSIS — S139XXA Sprain of joints and ligaments of unspecified parts of neck, initial encounter: Secondary | ICD-10-CM

## 2012-09-12 MED ORDER — ZOLPIDEM TARTRATE 5 MG PO TABS: 5.0000 mg | ORAL_TABLET | Freq: Every evening | ORAL | Status: DC | PRN

## 2012-09-12 NOTE — Patient Instructions (Addendum)
Cervical Sprain  A cervical sprain is when the ligaments in the neck stretch or tear. The ligaments are the tissues that hold the neck bones in place.  HOME CARE    Put ice on the injured area.   Put ice in a plastic bag.   Place a towel between your skin and the bag.   Leave the ice on for 15 to 20 minutes, 3 to 4 times a day.   Only take medicine as told by your doctor.   Keep all doctor visits as told.   Keep all physical therapy visits as told.   If your doctor gives you a neck collar, wear it as told.   Do not drive while wearing a neck collar.   Adjust your work station so that you have good posture while you work.   Avoid positions and activities that make your problems worse.   Warm up and stretch before being active.  GET HELP RIGHT AWAY IF:    You are bleeding or your stomach is upset.   You have an allergic reaction to your medicine.   Your problems (symptoms) get worse.   You develop new problems.   You lose feeling (numbness) or you cannot move (paralysis) any part of your body.   You have tingling or weakness in any part of your body.   Your pain is not controlled with medicine.   You cannot take less pain medicine over time as planned.   Your activity level does not improve as expected.  MAKE SURE YOU:    Understand these instructions.   Will watch your condition.   Will get help right away if you are not doing well or get worse.  Document Released: 10/10/2007 Document Revised: 07/16/2011 Document Reviewed: 01/25/2011  Exit[employer] Patient Information 2013 [employer].

## 2012-09-12 NOTE — Progress Notes (Signed)
  Shave Biopsy Procedure Note  Pre-operative Diagnosis: Suspicious lesion  Post-operative Diagnosis: same  Locations:lateral right temple  Indications: irritated  Anesthesia: 1 % xylocaine with epi without added sodium bicarbonate  Procedure Details  History of allergy to iodine: no  Patient informed of the risks (including bleeding and infection) and benefits of the  procedure and Written informed consent obtained.  The lesion and surrounding area were given a sterile prep using betadyne and draped in the usual sterile fashion. A scalpel was used to shave an area of skin approximately 1cm by 1cm x2   Hemostasis achieved with alumuninum chloride. Antibiotic ointment and a sterile dressing applied.  The specimen was sent for pathologic examination. The patient tolerated the procedure well.  EBL: 1.5 ml  Findings: Atypical mole  Condition: Stable  Complications: none.  Plan: 1. Instructed to keep the wound dry and covered for 24-48h and clean thereafter. 2. Warning signs of infection were reviewed.   3. Recommended that the patient use OTC acetaminophen as needed for pain.  4. Return in a few days--prn.

## 2012-09-14 DIAGNOSIS — S161XXA Strain of muscle, fascia and tendon at neck level, initial encounter: Secondary | ICD-10-CM | POA: Insufficient documentation

## 2012-09-14 NOTE — Assessment & Plan Note (Signed)
Muscle relaxer and pain med per meds and orders F/u chiropractor

## 2012-09-14 NOTE — Progress Notes (Signed)
  Subjective:    Patient ID: Amanda Joyce, female    DOB: 04/15/1941, 72 y.o.   MRN: [id number]  HPI Pt is also here c/o neck pain and inc insomnia.  No known injury.  Pt L side of neck with decreased rom.  No relief with otc meds.     Review of Systems As above       Objective:   Physical Exam BP 138/76  Pulse 63  Temp(Src) 98.5 F (36.9 C) (Oral)  Wt 180 lb 12.8 oz (82.01 kg)  BMI 26.9 kg/m2  SpO2 96% General appearance: alert, cooperative, appears stated age and no distress Head: Normocephalic, without obvious abnormality, atraumatic Nose: Nares normal. Septum midline. Mucosa normal. No drainage or sinus tenderness. Throat: lips, mucosa, and tongue normal; teeth and gums normal Neck: no adenopathy, no carotid bruit, no JVD and thyroid not enlarged, symmetric, no tenderness/mass/nodules Lymph nodes: Cervical, supraclavicular, and axillary nodes normal. Neurologic: Mental status: Alert, oriented, thought content appropriate Cranial nerves: normal Motor: decreased rot to Left> right + muscle spasm trap b/l L>R       Assessment & Plan:

## 2012-10-06 ENCOUNTER — Telehealth: Payer: Self-pay | Admitting: Family Medicine

## 2012-10-06 DIAGNOSIS — G8929 Other chronic pain: Secondary | ICD-10-CM

## 2012-10-06 NOTE — Telephone Encounter (Signed)
Last seen 09/12/12 and filled 09/04/12 #90. Please advise      KP

## 2012-10-06 NOTE — Telephone Encounter (Signed)
Refill x1 

## 2012-10-06 NOTE — Telephone Encounter (Signed)
Patient is calling to request a refill on her Oxycodone Rx and Pristiq Rx. Please call when ready.

## 2012-10-07 MED ORDER — OXYCODONE-ACETAMINOPHEN 5-325 MG PO TABS: ORAL_TABLET | ORAL | Status: DC

## 2012-10-07 NOTE — Telephone Encounter (Signed)
Patient aware Rx ready for pick up. However Pristiq patient assistance program has expired and the patient will need to resubmit her information for assistance. She would like to know if you have any suggestions on [facility]. I made the patient aware of all of the above and she will call Dr.Digby per Dr.Lowne.       KP

## 2012-10-20 ENCOUNTER — Other Ambulatory Visit: Payer: Self-pay | Admitting: Family Medicine

## 2012-10-20 MED ORDER — IPRATROPIUM BROMIDE 0.06 % NA SOLN: NASAL | Status: DC

## 2012-10-20 NOTE — Addendum Note (Signed)
Addended by: Arnette Norris on: 10/20/2012 05:15 PM   Modules accepted: Orders

## 2012-10-22 MED ORDER — IPRATROPIUM BROMIDE 0.06 % NA SOLN: NASAL | Status: DC

## 2012-10-22 NOTE — Addendum Note (Signed)
Addended byDuaine Dredge, Sunny Schlein L on: 10/22/2012 01:04 PM   Modules accepted: Orders

## 2012-10-22 NOTE — Telephone Encounter (Signed)
Rx sent 

## 2012-11-05 ENCOUNTER — Telehealth: Payer: Self-pay | Admitting: Family Medicine

## 2012-11-05 ENCOUNTER — Other Ambulatory Visit: Payer: Self-pay | Admitting: Oncology

## 2012-11-05 DIAGNOSIS — Z9889 Other specified postprocedural states: Secondary | ICD-10-CM

## 2012-11-05 DIAGNOSIS — Z853 Personal history of malignant neoplasm of breast: Secondary | ICD-10-CM

## 2012-11-05 DIAGNOSIS — G8929 Other chronic pain: Secondary | ICD-10-CM

## 2012-11-05 NOTE — Telephone Encounter (Signed)
Patient is calling requesting a refill of her Oxycodone script. Please call when ready.

## 2012-11-05 NOTE — Telephone Encounter (Signed)
Last seen 09/12/12 and filled 10/07/12 #90. Please advise     KP

## 2012-11-05 NOTE — Telephone Encounter (Signed)
Ok to refill x 1  

## 2012-11-06 MED ORDER — OXYCODONE-ACETAMINOPHEN 5-325 MG PO TABS: ORAL_TABLET | ORAL | Status: DC

## 2012-11-06 NOTE — Telephone Encounter (Signed)
Pt aware Rx ready for pick up.    KP 

## 2012-11-28 ENCOUNTER — Telehealth: Payer: Self-pay | Admitting: Family Medicine

## 2012-11-28 DIAGNOSIS — G4700 Insomnia, unspecified: Secondary | ICD-10-CM

## 2012-11-28 MED ORDER — ZOLPIDEM TARTRATE 5 MG PO TABS: 5.0000 mg | ORAL_TABLET | Freq: Every evening | ORAL | Status: DC | PRN

## 2012-11-28 NOTE — Telephone Encounter (Signed)
Patient aware and Rx sent.    KP 

## 2012-11-28 NOTE — Telephone Encounter (Signed)
Patient is requesting a refill of zolpidem to be sent to [facility] on [address]. Patient is requesting a 90 day supply

## 2012-11-28 NOTE — Telephone Encounter (Signed)
#  30 ; qhs prn unless she wants to wait for Dr Laury Axon to return

## 2012-11-28 NOTE — Telephone Encounter (Signed)
Last seen and filled 09/12/12 #90. Please advise      KP

## 2012-12-03 ENCOUNTER — Other Ambulatory Visit: Payer: Self-pay | Admitting: Physician Assistant

## 2012-12-03 DIAGNOSIS — C50919 Malignant neoplasm of unspecified site of unspecified female breast: Secondary | ICD-10-CM

## 2012-12-04 ENCOUNTER — Ambulatory Visit: Payer: Medicare Other | Admitting: Oncology

## 2012-12-04 ENCOUNTER — Other Ambulatory Visit (HOSPITAL_BASED_OUTPATIENT_CLINIC_OR_DEPARTMENT_OTHER): Payer: Medicare Other | Admitting: Lab

## 2012-12-04 DIAGNOSIS — C50919 Malignant neoplasm of unspecified site of unspecified female breast: Secondary | ICD-10-CM

## 2012-12-04 LAB — CBC WITH DIFFERENTIAL/PLATELET
BASO%: 1.3 % (ref 0.0–2.0)
Basophils Absolute: 0.1 10*3/uL (ref 0.0–0.1)
EOS%: 3 % (ref 0.0–7.0)
HCT: 43 % (ref 34.8–46.6)
HGB: 14.3 g/dL (ref 11.6–15.9)
MCH: 29.8 pg (ref 25.1–34.0)
MCHC: 33.3 g/dL (ref 31.5–36.0)
MCV: 89.5 fL (ref 79.5–101.0)
MONO%: 5 % (ref 0.0–14.0)
NEUT%: 69.5 % (ref 38.4–76.8)
lymph#: 1.7 10*3/uL (ref 0.9–3.3)

## 2012-12-04 LAB — COMPREHENSIVE METABOLIC PANEL (CC13)
ALT: 10 U/L (ref 0–55)
AST: 14 U/L (ref 5–34)
Alkaline Phosphatase: 64 U/L (ref 40–150)
BUN: 13.9 mg/dL (ref 7.0–26.0)
Chloride: 104 mEq/L (ref 98–109)
Creatinine: 0.9 mg/dL (ref 0.6–1.1)
Total Bilirubin: 0.89 mg/dL (ref 0.20–1.20)

## 2012-12-08 ENCOUNTER — Ambulatory Visit
Admission: RE | Admit: 2012-12-08 | Discharge: 2012-12-08 | Disposition: A | Discharge status: Home / Self Care | Payer: Medicare Other | Source: Ambulatory Visit | Attending: Oncology | Admitting: Oncology

## 2012-12-08 DIAGNOSIS — Z853 Personal history of malignant neoplasm of breast: Secondary | ICD-10-CM

## 2012-12-08 DIAGNOSIS — Z9889 Other specified postprocedural states: Secondary | ICD-10-CM

## 2012-12-11 ENCOUNTER — Other Ambulatory Visit: Payer: Medicare Other | Admitting: Lab

## 2012-12-11 ENCOUNTER — Ambulatory Visit (HOSPITAL_BASED_OUTPATIENT_CLINIC_OR_DEPARTMENT_OTHER): Payer: Medicare Other | Admitting: Oncology

## 2012-12-11 ENCOUNTER — Telehealth: Payer: Self-pay | Admitting: *Deleted

## 2012-12-11 VITALS — BP 136/93 | HR 61 | Temp 98.3°F | Resp 20 | Ht 68.75 in | Wt 178.1 lb

## 2012-12-11 DIAGNOSIS — C50919 Malignant neoplasm of unspecified site of unspecified female breast: Secondary | ICD-10-CM

## 2012-12-11 NOTE — Telephone Encounter (Signed)
sw pt gv appt for 11/30/13 w?labs@ 11:30am, and on 12/07/13 @ 11:30am. Pt is aware that i will mail a letter/avs as well...td

## 2012-12-11 NOTE — Progress Notes (Signed)
ID: EFFA YARROW   DOB: 06-01-1940  MR#: [id number]  WUJ#:[id number]  PCP: Amanda Freud, DO GYN: SU:  OTHER MD:   HISTORY OF PRESENT ILLNESS: Amanda Joyce palpated a lump in her left breast in early 2006.  This was evaluated with mammography which showed indeed a suspicious lesion (I do not have any radiology records) and lead to biopsy of the left breast mass on May 23, 2004.    The pathology report (Excision No. [id number] at [facility] in [place], [place]) showed an infiltrating ductal carcinoma, grade 2, with a small percentage in situ component.  No evidence of lymphovascular invasion.  Strongly positive for ER and PR, the Hercept testing being 1+ and the HER-2 by FISH being in the equivocal range with a ratio of 1.9.    With this information, the patient proceeded to definitive surgical removal of the tumor under Dr. Hessie Joyce on June 15, 2004.  This was a left lumpectomy and axillary lymph node dissection.  The final pathology ([id number]) showed a 4 cm infiltrating ductal carcinoma, grade 2, with probable lymphatic invasion, but more importantly, with 3 out of 16 lymph nodes involved, positive for metastatic carcinoma, including focal extracapsular extension.  There was a positive margin (inferior) and this was cleared by further surgery on June 26, 2004 ([id number]).  The patient was then staged with no evidence of metastatic disease and was treated with dose-dense Cytoxan and Adriamycin followed by dose-dense Taxol, all by standard protocol to the best of my ability to tell from the records and from the patient.  It was followed by radiation (I do not have the radiation records) completed in October of 2006.  Her subsequent history is as detailed below    INTERVAL HISTORY: Amanda Joyce returns for followup of her breast cancer. The interval history is unremarkable. She and her son have moved to a new house. She currently has 2 dogs and one you to  contact, which she says weighs 16 pounds.  REVIEW OF SYSTEMS: She hurts "all over" and tells me this has been going on since she received her chemotherapy. These are muscle aches, very intermittent, and she takes a "perky" twice a day for this. This does constipate her, and she makes an effort to drink more water and occasionally uses stool softeners to deal with that. She does have a history of irritable bowel. Her hot flashes are worse in the morning, but they're not particularly troublesome.  She had a right cataract done and is planning to have the left one soon. Otherwise a detailed review of systems today was noncontributory  PAST MEDICAL HISTORY: Past Medical History  Diagnosis Date  . Arthritis   . Hypertension   . Depression   . Gout   . Temporomandibular joint disorders, unspecified   . Personal history of malignant neoplasm of breast   . Breast cancer     Left   . Osteoarthrosis, unspecified whether generalized or localized, unspecified site   . Irritable bowel syndrome   . Nontoxic uninodular goiter   . Hyperlipemia   . Hiatal hernia   . GERD (gastroesophageal reflux disease)   Significant in addition to the above for the patient being status post right thyroidectomy at age 55.  She also underwent ultrasound-guided thyroid nodule fine-Joyce aspiration on Sep 13, 2004.  I do not have the pathology report from that, but according to the patient, everything was benign. Other medical problems include status post appendectomy, status post right salpingo-oophorectomy in the patient's  mid-twenties for an ovarian cyst, status post left herniorrhaphy, history of irritable bowel syndrome and a history of hypertension.    PAST SURGICAL HISTORY: Past Surgical History  Procedure Laterality Date  . Appendectomy    . Hernia repair    . Abdominal hysterectomy    . Thyroid surgery    . Breast lumpectomy      Left breast X2    FAMILY HISTORY Family History  Problem Relation Age of  Onset  . Coronary artery disease Father   . Liver cancer Paternal Uncle   . Alcohol abuse Paternal Uncle   . Colon cancer Neg Hx   The patient's father died from a myocardial infarction at the age of 63.  The patient's mother died from "brain cancer" when the patient was 71 years old. She is an only child.  GYNECOLOGIC HISTORY: She is GX, P2.  First pregnancy to term at age 42.  Last menstrual period more than 15 years ago. She took hormones all of those 15 years until her recent diagnosis of breast cancer.    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  She has been "happily divorced" x 2.  Her son, Amanda Joyce, lives in [place].  He buys and sells cars.  The patient moved to this area to live with him.  Her daughter, Amanda Joyce, lives with her husband, Amanda Joyce, in [place], where the patient's 4 granddaughters reside.  Amanda Joyce has no children.  She and Amanda Needle do share two dogs (Mini-Australian Shepherds which they used to raise) and two cats.  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     ADVANCED DIRECTIVES: Not in play  HEALTH MAINTENANCE: History  Substance Use Topics  . Smoking status: Never Smoker   . Smokeless tobacco: Never Used  . Alcohol Use: No     Colonoscopy:  PAP:  Bone density:  Lipid panel:  Allergies  Allergen Reactions  . Clarithromycin     REACTION: NIGHTMARES  . Influenza Virus Vaccine Split Nausea And Vomiting  . Penicillins     REACTION: ITCHING    Current Outpatient Prescriptions  Medication Sig Dispense Refill  . atenolol (TENORMIN) 50 MG tablet Take 1 tablet (50 mg total) by mouth 2 (two) times daily.  180 tablet  3  . Azelastine-Fluticasone (DYMISTA) 137-50 MCG/ACT SUSP Place 1 spray into the nose 2 (two) times daily.      . carisoprodol (SOMA) 350 MG tablet Take 1 tablet (350 mg total) by mouth 3 (three) times daily as needed.  30 tablet  0  .  desvenlafaxine (PRISTIQ) 100 MG 24 hr tablet Take 100 mg by mouth daily.       Marland Kitchen diltiazem (CARDIZEM CD) 240 MG 24 hr capsule Take 1 capsule (240 mg total) by mouth daily.  90 capsule  0  . ibuprofen (ADVIL,MOTRIN) 800 MG tablet TAKE ONE TABLET BY MOUTH EVERY 6 HOURS AS NEEDED  60 tablet  1  . ipratropium (ATROVENT) 0.06 % nasal spray USE TWO DOSES THREE TIMES DAILY  15 mL  2  . ipratropium (ATROVENT) 0.06 % nasal spray USE TWO DOSES THREE TIMES DAILY  15 mL  2  . oxyCODONE-acetaminophen (PERCOCET/ROXICET) 5-325 MG per tablet 1-2 tablets by mouth twice a day as needed  90 tablet  0  . pravastatin (PRAVACHOL) 20 MG tablet 1 tab by mouth qhs  90 tablet  3  . quinapril (ACCUPRIL) 40 MG  tablet Take 1 tablet (40 mg total) by mouth daily.  90 tablet  3  . zolpidem (AMBIEN) 5 MG tablet Take 1 tablet (5 mg total) by mouth at bedtime as needed for sleep.  30 tablet  0   No current facility-administered medications for this visit.    OBJECTIVE: Middle-aged white woman who appears stated age 32 Vitals:   12/11/12 1217  BP: 136/93  Pulse: 61  Temp: 98.3 F (36.8 C)  Resp: 20     Body mass index is 26.5 kg/(m^2).    ECOG FS: 1  Sclerae unicteric, pupils equal round and reactive to light Oropharynx clear No cervical or supraclavicular adenopathy Lungs no rales or rhonchi Heart regular rate and rhythm Abd soft nontender, positive bowel sounds MSK no focal spinal tenderness, no peripheral edema Neuro: nonfocal, well oriented, positive affect Breasts: The right breast is unremarkable. The left breast is status post lumpectomy and radiation. There is no evidence of local recurrence. The left axilla is benign  LAB RESULTS: Lab Results  Component Value Date   WBC 8.0 12/04/2012   NEUTROABS 5.6 12/04/2012   HGB 14.3 12/04/2012   HCT 43.0 12/04/2012   MCV 89.5 12/04/2012   PLT 296 12/04/2012      Chemistry      Component Value Date/Time   NA 142 12/04/2012 1152   NA 139 12/10/2011 1000   K 4.1  12/04/2012 1152   K 4.4 12/10/2011 1000   CL 101 12/10/2011 1000   CO2 28 12/04/2012 1152   CO2 30 12/10/2011 1000   BUN 13.9 12/04/2012 1152   BUN 16 12/10/2011 1000   CREATININE 0.9 12/04/2012 1152   CREATININE 0.81 12/10/2011 1000      Component Value Date/Time   CALCIUM 10.3 12/04/2012 1152   CALCIUM 10.4 12/10/2011 1000   ALKPHOS 64 12/04/2012 1152   ALKPHOS 53 12/10/2011 1000   AST 14 12/04/2012 1152   AST 16 12/10/2011 1000   ALT 10 12/04/2012 1152   ALT 11 12/10/2011 1000   BILITOT 0.89 12/04/2012 1152   BILITOT 0.6 12/10/2011 1000       Lab Results  Component Value Date   LABCA2 24 12/06/2010    No components found with this basename: [id number]    No results found for this basename: INR,  in the last 168 hours  Urinalysis    Component Value Date/Time   COLORURINE yellow 12/20/2009 1506   APPEARANCEUR Hazy 12/20/2009 1506   LABSPEC 1.015 12/20/2009 1506   LABSPEC 1.020 12/24/2005 1645   PHURINE 6.5 12/20/2009 1506   GLUCOSEU NEGATIVE 03/28/2007 1306   HGBUR negative 12/20/2009 1506   HGBUR SMALL* 03/28/2007 1306   BILIRUBINUR Neg 03/06/2012 1415   BILIRUBINUR negative 12/20/2009 1506   KETONESUR NEGATIVE 03/28/2007 1306   PROTEINUR NEGATIVE 03/28/2007 1306   UROBILINOGEN 0.2 03/06/2012 1415   UROBILINOGEN 0.2 12/20/2009 1506   NITRITE Neg 03/06/2012 1415   NITRITE negative 12/20/2009 1506   LEUKOCYTESUR moderate (2+) 03/06/2012 1415    STUDIES: Mm Digital Diagnostic Bilat  12/08/2012   *RADIOLOGY REPORT*  Clinical Data:  The patient underwent left lumpectomy, chemotherapy, and radiation therapy for breast cancer in 2006.  DIGITAL DIAGNOSTIC BILATERAL MAMMOGRAM WITH CAD  DIGITAL BREAST TOMOSYNTHESIS  Digital breast tomosynthesis images are acquired in two projections.  These images are reviewed in combination with the digital mammogram, confirming the findings below.  Comparison: 12/06/2011, 12/04/2010, 12/02/2009, 12/01/2008, 12/01/2007  Findings:  ACR Breast Density Category b:  There are  scattered areas of fibroglandular density.  Left lumpectomy changes are present.  Coarse benign dystrophic calcifications are noted in the lumpectomy site.  There is no suspicious dominant mass, nonsurgical architectural distortion, or calcification to suggest malignancy.  Mammographic images were processed with CAD.  IMPRESSION: No mammographic evidence of malignancy.  RECOMMENDATION: Yearly screening mammography may now be instituted.  I have discussed the findings and recommendations with the patient. Results were also provided in writing at the conclusion of the visit.  If applicable, a reminder letter will be sent to the patient regarding her next appointment.  BI-RADS CATEGORY 2:  Benign finding(s).   Original Report Authenticated By: Cain Saupe, M.D.  ign finding(s).  Original Report Authenticated By: Darrol Angel, M.D.    ASSESSMENT: 72 y.o. 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  She was on Arimidex between September 2006 and June 2009 at which time she was switched to tamoxifen, completed June 2012.    PLAN: Dajana shows no evidence of disease recurrence now 8 years out from her definitive surgery. Coming here is "A-plus" for her and the plan is to continue to follow her yearly indefinitely. Once we establish our survivorship clinic, she would be an excellent candidate for it. She will see me again in one year. We will repeat lab work and physical exam at that time. She knows to call for any problems that may develop before that.  Brynli Ollis C    12/11/2012

## 2012-12-15 ENCOUNTER — Telehealth: Payer: Self-pay | Admitting: Family Medicine

## 2012-12-15 DIAGNOSIS — G4700 Insomnia, unspecified: Secondary | ICD-10-CM

## 2012-12-15 DIAGNOSIS — G8929 Other chronic pain: Secondary | ICD-10-CM

## 2012-12-15 NOTE — Telephone Encounter (Signed)
Patient is calling to request a 90-day supply of on her zolpidem (AMBIEN) 5 MG tablet prescription to be sent to her pharmacy on file.  Also says that she needs a new rx for a 90-day supply of Oxycodone. Please advise.

## 2012-12-16 MED ORDER — OXYCODONE-ACETAMINOPHEN 5-325 MG PO TABS: ORAL_TABLET | ORAL | Status: DC

## 2012-12-16 MED ORDER — ZOLPIDEM TARTRATE 5 MG PO TABS: 5.0000 mg | ORAL_TABLET | Freq: Every evening | ORAL | Status: DC | PRN

## 2012-12-16 NOTE — Telephone Encounter (Signed)
Last seen 09/12/12 and filled Amb 11/28/12 #30 and Oxy 11/06/12 #90. Please advise       KP

## 2012-12-16 NOTE — Telephone Encounter (Signed)
Ok to refill both x 1  

## 2012-12-16 NOTE — Telephone Encounter (Signed)
Msg left advising Rx ready for pick up.      KP 

## 2012-12-18 ENCOUNTER — Ambulatory Visit: Payer: Medicare Other | Admitting: Oncology

## 2013-01-09 ENCOUNTER — Emergency Department (HOSPITAL_COMMUNITY): Payer: Medicare Other

## 2013-01-09 ENCOUNTER — Ambulatory Visit (INDEPENDENT_AMBULATORY_CARE_PROVIDER_SITE_OTHER): Payer: Medicare Other | Admitting: Family Medicine

## 2013-01-09 ENCOUNTER — Emergency Department (HOSPITAL_COMMUNITY)
Admission: EM | Admit: 2013-01-09 | Discharge: 2013-01-09 | Disposition: A | Discharge status: Home / Self Care | Payer: Medicare Other | Attending: Emergency Medicine | Admitting: Emergency Medicine

## 2013-01-09 ENCOUNTER — Encounter: Payer: Self-pay | Admitting: Family Medicine

## 2013-01-09 ENCOUNTER — Encounter (HOSPITAL_COMMUNITY): Payer: Self-pay

## 2013-01-09 VITALS — BP 152/80 | HR 93 | Temp 100.4°F | Wt 176.0 lb

## 2013-01-09 DIAGNOSIS — R51 Headache: Secondary | ICD-10-CM | POA: Insufficient documentation

## 2013-01-09 DIAGNOSIS — Z880 Allergy status to penicillin: Secondary | ICD-10-CM | POA: Insufficient documentation

## 2013-01-09 DIAGNOSIS — I10 Essential (primary) hypertension: Secondary | ICD-10-CM | POA: Insufficient documentation

## 2013-01-09 DIAGNOSIS — IMO0001 Reserved for inherently not codable concepts without codable children: Secondary | ICD-10-CM

## 2013-01-09 DIAGNOSIS — R109 Unspecified abdominal pain: Secondary | ICD-10-CM | POA: Insufficient documentation

## 2013-01-09 DIAGNOSIS — Z862 Personal history of diseases of the blood and blood-forming organs and certain disorders involving the immune mechanism: Secondary | ICD-10-CM | POA: Insufficient documentation

## 2013-01-09 DIAGNOSIS — Z9089 Acquired absence of other organs: Secondary | ICD-10-CM | POA: Insufficient documentation

## 2013-01-09 DIAGNOSIS — R110 Nausea: Secondary | ICD-10-CM | POA: Insufficient documentation

## 2013-01-09 DIAGNOSIS — Z8639 Personal history of other endocrine, nutritional and metabolic disease: Secondary | ICD-10-CM | POA: Insufficient documentation

## 2013-01-09 DIAGNOSIS — R509 Fever, unspecified: Secondary | ICD-10-CM

## 2013-01-09 DIAGNOSIS — J189 Pneumonia, unspecified organism: Secondary | ICD-10-CM | POA: Insufficient documentation

## 2013-01-09 DIAGNOSIS — Z79899 Other long term (current) drug therapy: Secondary | ICD-10-CM | POA: Insufficient documentation

## 2013-01-09 DIAGNOSIS — Z853 Personal history of malignant neoplasm of breast: Secondary | ICD-10-CM | POA: Insufficient documentation

## 2013-01-09 DIAGNOSIS — F329 Major depressive disorder, single episode, unspecified: Secondary | ICD-10-CM | POA: Insufficient documentation

## 2013-01-09 DIAGNOSIS — M129 Arthropathy, unspecified: Secondary | ICD-10-CM | POA: Insufficient documentation

## 2013-01-09 DIAGNOSIS — E785 Hyperlipidemia, unspecified: Secondary | ICD-10-CM | POA: Insufficient documentation

## 2013-01-09 DIAGNOSIS — IMO0002 Reserved for concepts with insufficient information to code with codable children: Secondary | ICD-10-CM | POA: Insufficient documentation

## 2013-01-09 DIAGNOSIS — M1990 Unspecified osteoarthritis, unspecified site: Secondary | ICD-10-CM | POA: Insufficient documentation

## 2013-01-09 DIAGNOSIS — Z8719 Personal history of other diseases of the digestive system: Secondary | ICD-10-CM | POA: Insufficient documentation

## 2013-01-09 DIAGNOSIS — Z90710 Acquired absence of both cervix and uterus: Secondary | ICD-10-CM | POA: Insufficient documentation

## 2013-01-09 DIAGNOSIS — F3289 Other specified depressive episodes: Secondary | ICD-10-CM | POA: Insufficient documentation

## 2013-01-09 LAB — CBC WITH DIFFERENTIAL/PLATELET
Basophils Absolute: 0 10*3/uL (ref 0.0–0.1)
Basophils Relative: 0 % (ref 0–1)
Basophils Relative: 0 % (ref 0–1)
Eosinophils Absolute: 0 10*3/uL (ref 0.0–0.7)
Eosinophils Absolute: 0 10*3/uL (ref 0.0–0.7)
Eosinophils Relative: 0 % (ref 0–5)
HCT: 41.4 % (ref 36.0–46.0)
Hemoglobin: 14.1 g/dL (ref 12.0–15.0)
Hemoglobin: 14.4 g/dL (ref 12.0–15.0)
Lymphocytes Relative: 8 % — ABNORMAL LOW (ref 12–46)
Lymphs Abs: 1.3 10*3/uL (ref 0.7–4.0)
Lymphs Abs: 1.2 10*3/uL (ref 0.7–4.0)
MCH: 29.6 pg (ref 26.0–34.0)
MCH: 30.1 pg (ref 26.0–34.0)
MCHC: 34.8 g/dL (ref 30.0–36.0)
MCV: 86.6 fL (ref 78.0–100.0)
Monocytes Absolute: 1.3 10*3/uL — ABNORMAL HIGH (ref 0.1–1.0)
Monocytes Relative: 8 % (ref 3–12)
Neutro Abs: 10.8 10*3/uL — ABNORMAL HIGH (ref 1.7–7.7)
Neutro Abs: 13.4 10*3/uL — ABNORMAL HIGH (ref 1.7–7.7)
Neutrophils Relative %: 82 % — ABNORMAL HIGH (ref 43–77)
Neutrophils Relative %: 84 % — ABNORMAL HIGH (ref 43–77)
Platelets: 305 10*3/uL (ref 150–400)
Platelets: 285 10*3/uL (ref 150–400)
RBC: 4.77 MIL/uL (ref 3.87–5.11)
RBC: 4.78 MIL/uL (ref 3.87–5.11)
RDW: 12.5 % (ref 11.5–15.5)
WBC: 13.3 10*3/uL — ABNORMAL HIGH (ref 4.0–10.5)
WBC: 16 10*3/uL — ABNORMAL HIGH (ref 4.0–10.5)

## 2013-01-09 LAB — URINALYSIS, ROUTINE W REFLEX MICROSCOPIC
Bilirubin Urine: NEGATIVE
Glucose, UA: NEGATIVE mg/dL
Ketones, ur: 15 mg/dL — AB
Nitrite: NEGATIVE
Protein, ur: >300 mg/dL — AB
Specific Gravity, Urine: 1.022 (ref 1.005–1.030)
Urobilinogen, UA: 0.2 mg/dL (ref 0.0–1.0)
pH: 7 (ref 5.0–8.0)

## 2013-01-09 LAB — BASIC METABOLIC PANEL
BUN: 11 mg/dL (ref 6–23)
CO2: 25 mEq/L (ref 19–32)
Calcium: 9.9 mg/dL (ref 8.4–10.5)
Chloride: 92 mEq/L — ABNORMAL LOW (ref 96–112)
Creatinine, Ser: 0.59 mg/dL (ref 0.50–1.10)
GFR calc Af Amer: >90 mL/min (ref >90)
GFR calc non Af Amer: 89 mL/min — ABNORMAL LOW (ref >90)
Glucose, Bld: 140 mg/dL — ABNORMAL HIGH (ref 70–99)
Potassium: 3.6 mEq/L (ref 3.5–5.1)
Sodium: 130 mEq/L — ABNORMAL LOW (ref 135–145)

## 2013-01-09 LAB — POCT INFLUENZA A/B
Influenza A, POC: NEGATIVE
Influenza B, POC: NEGATIVE

## 2013-01-09 LAB — URINE MICROSCOPIC-ADD ON

## 2013-01-09 LAB — RAPID HIV SCREEN (WH-MAU): Rapid HIV Screen: NONREACTIVE

## 2013-01-09 MED ORDER — METOCLOPRAMIDE HCL 5 MG/ML IJ SOLN
10.0000 mg | Freq: Once | INTRAMUSCULAR | Status: AC
  Administered 2013-01-09: 10 mg via INTRAVENOUS
  Filled 2013-01-09: qty 2

## 2013-01-09 MED ORDER — LEVOFLOXACIN 500 MG PO TABS
750.0000 mg | ORAL_TABLET | Freq: Once | ORAL | Status: AC
  Administered 2013-01-09: 750 mg via ORAL
  Filled 2013-01-09: qty 2

## 2013-01-09 MED ORDER — LEVOFLOXACIN 500 MG PO TABS: 500.0000 mg | ORAL_TABLET | Freq: Every day | ORAL | Status: DC

## 2013-01-09 MED ORDER — DIPHENHYDRAMINE HCL 50 MG/ML IJ SOLN
25.0000 mg | Freq: Once | INTRAMUSCULAR | Status: AC
  Administered 2013-01-09: 25 mg via INTRAVENOUS
  Filled 2013-01-09: qty 1

## 2013-01-09 MED ORDER — SODIUM CHLORIDE 3 % IN NEBU
5.0000 mL | INHALATION_SOLUTION | Freq: Once | RESPIRATORY_TRACT | Status: AC | PRN
  Administered 2013-01-09: 5 mL via RESPIRATORY_TRACT
  Filled 2013-01-09: qty 8

## 2013-01-09 MED ORDER — SODIUM CHLORIDE 0.9 % IV BOLUS (SEPSIS)
1000.0000 mL | Freq: Once | INTRAVENOUS | Status: AC
  Administered 2013-01-09: 1000 mL via INTRAVENOUS

## 2013-01-09 MED ORDER — KETOROLAC TROMETHAMINE 15 MG/ML IJ SOLN
15.0000 mg | Freq: Once | INTRAMUSCULAR | Status: AC
  Administered 2013-01-09: 15 mg via INTRAVENOUS
  Filled 2013-01-09: qty 1

## 2013-01-09 MED ORDER — PROMETHAZINE HCL 50 MG/ML IJ SOLN
50.0000 mg | Freq: Once | INTRAMUSCULAR | Status: AC
  Administered 2013-01-09: 50 mg via INTRAMUSCULAR

## 2013-01-09 NOTE — ED Notes (Signed)
Phlebotomy called for blood draw. °

## 2013-01-09 NOTE — Progress Notes (Signed)
Subjective:    Patient ID: Amanda Joyce, female    DOB: 08-Oct-1940, 72 y.o.   MRN: [id number]  HPI Pt here with her husband c/o severe headache, nausea, body aches and high fevers since Tuesday.  She really has not been able to eat or drink.   No vomiting or diarrhea    Review of Systems As above Past Medical History  Diagnosis Date  . Arthritis   . Hypertension   . Depression   . Gout   . Temporomandibular joint disorders, unspecified   . Personal history of malignant neoplasm of breast   . Breast cancer     Left   . Osteoarthrosis, unspecified whether generalized or localized, unspecified site   . Irritable bowel syndrome   . Nontoxic uninodular goiter   . Hyperlipemia   . Hiatal hernia   . GERD (gastroesophageal reflux disease)    Current Outpatient Prescriptions on File Prior to Visit  Medication Sig Dispense Refill  . atenolol (TENORMIN) 50 MG tablet Take 1 tablet (50 mg total) by mouth 2 (two) times daily.  180 tablet  3  . Azelastine-Fluticasone (DYMISTA) 137-50 MCG/ACT SUSP Place 1 spray into the nose 2 (two) times daily.      . carisoprodol (SOMA) 350 MG tablet Take 1 tablet (350 mg total) by mouth 3 (three) times daily as needed.  30 tablet  0  . desvenlafaxine (PRISTIQ) 100 MG 24 hr tablet Take 100 mg by mouth daily.       Marland Kitchen diltiazem (CARDIZEM CD) 240 MG 24 hr capsule Take 1 capsule (240 mg total) by mouth daily.  90 capsule  0  . ibuprofen (ADVIL,MOTRIN) 800 MG tablet TAKE ONE TABLET BY MOUTH EVERY 6 HOURS AS NEEDED  60 tablet  1  . ipratropium (ATROVENT) 0.06 % nasal spray USE TWO DOSES THREE TIMES DAILY  15 mL  2  . ipratropium (ATROVENT) 0.06 % nasal spray USE TWO DOSES THREE TIMES DAILY  15 mL  2  . oxyCODONE-acetaminophen (PERCOCET/ROXICET) 5-325 MG per tablet 1-2 tablets by mouth twice a day as needed  90 tablet  0  . pravastatin (PRAVACHOL) 20 MG tablet 1 tab by mouth qhs  90 tablet  3  . quinapril (ACCUPRIL) 40 MG tablet Take 1 tablet (40 mg total) by  mouth daily.  90 tablet  3  . zolpidem (AMBIEN) 5 MG tablet Take 1 tablet (5 mg total) by mouth at bedtime as needed for sleep.  60 tablet  0   No current facility-administered medications on file prior to visit.   Family History  Problem Relation Age of Onset  . Coronary artery disease Father   . Liver cancer Paternal Uncle   . Alcohol abuse Paternal Uncle   . Colon cancer Neg Hx    Past Surgical History  Procedure Laterality Date  . Appendectomy    . Hernia repair    . Abdominal hysterectomy    . Thyroid surgery    . Breast lumpectomy      Left breast X2       Objective:   Physical Exam  BP 152/80  Pulse 93  Temp(Src) 100.4 F (38 C) (Oral)  Wt 176 lb (79.833 kg)  BMI 26.19 kg/m2  SpO2 95% General appearance: alert, cooperative, appears stated age and severe distress Head: Normocephalic, without obvious abnormality, atraumatic, r sided sinus Eyes: negative findings: pupils equal, round, reactive to light and accomodation Ears: normal TM's and external ear canals both ears Nose: no  discharge, sinus tenderness right Throat: lips, mucosa, and tongue normal; teeth and gums normal Neck: moderate anterior cervical adenopathy Lungs: clear to auscultation bilaterally Heart: S1, S2 normal      Assessment & Plan:   severe headache with fever---- pt being transported to ER by EMS (at her request)                                                Phenergan 50 mg IM given in office

## 2013-01-09 NOTE — ED Notes (Signed)
Pt presents via EMS with multiple complaints. Pt lives at home. Pt was seen at her PCP today and the doctor wanted her evaluated for meningitis. Pt presents with neck pain, flank pain, headache, nausea. PCP gave pt 50 mg phernegan IM. Pt does report frequency urinating to EMS. Pt does have a hx of UTI's and kidney infections and reports that this pain feels the same. Pt was hypertensive en route for EMS.

## 2013-01-09 NOTE — ED Provider Notes (Signed)
CSN: [id number]     Arrival date & time 01/09/13  1718 History   First MD Initiated Contact with Patient 01/09/13 1720     Chief Complaint  Patient presents with  . Headache  . Generalized Body Aches  . Flank Pain   (Consider location/radiation/quality/duration/timing/severity/associated sxs/prior Treatment) The history is provided by the patient and medical records.    72yF with multiple complaints. Gradual onset of symptoms a couple days ago. Feels generally weak and achy. HA. Diffuse, but worse in frontal region. Diffuse body aches. Thighs, back , neck, etc. Nausea. No vomiting. No photophobia. No earache or sore throat. No seizure. No change in MS per son at bedside.  No Seen by PCP just prior to arrival and given phenergan with some relief.   Past Medical History  Diagnosis Date  . Arthritis   . Hypertension   . Depression   . Gout   . Temporomandibular joint disorders, unspecified   . Personal history of malignant neoplasm of breast   . Breast cancer     Left   . Osteoarthrosis, unspecified whether generalized or localized, unspecified site   . Irritable bowel syndrome   . Nontoxic uninodular goiter   . Hyperlipemia   . Hiatal hernia   . GERD (gastroesophageal reflux disease)    Past Surgical History  Procedure Laterality Date  . Appendectomy    . Hernia repair    . Abdominal hysterectomy    . Thyroid surgery    . Breast lumpectomy      Left breast X2   Family History  Problem Relation Age of Onset  . Coronary artery disease Father   . Liver cancer Paternal Uncle   . Alcohol abuse Paternal Uncle   . Colon cancer Neg Hx    History  Substance Use Topics  . Smoking status: Never Smoker   . Smokeless tobacco: Never Used  . Alcohol Use: No   OB History   Grav Para Term Preterm Abortions TAB SAB Ect Mult Living                 Review of Systems  All systems reviewed and negative, other than as noted in HPI.  Allergies  Clarithromycin; Influenza virus  vaccine split; and Penicillins  Home Medications   Current Outpatient Rx  Name  Route  Sig  Dispense  Refill  . atenolol (TENORMIN) 50 MG tablet   Oral   Take 1 tablet (50 mg total) by mouth 2 (two) times daily.   180 tablet   3   . desvenlafaxine (PRISTIQ) 50 MG 24 hr tablet   Oral   Take 50 mg by mouth daily.         Marland Kitchen diltiazem (CARDIZEM CD) 240 MG 24 hr capsule   Oral   Take 1 capsule (240 mg total) by mouth daily.   90 capsule   0   . ipratropium (ATROVENT) 0.06 % nasal spray   Nasal   Place 2 sprays into the nose 3 (three) times daily.         Marland Kitchen oxyCODONE-acetaminophen (PERCOCET/ROXICET) 5-325 MG per tablet      1-2 tablets by mouth twice a day as needed   90 tablet   0   . pravastatin (PRAVACHOL) 20 MG tablet   Oral   Take 20 mg by mouth at bedtime.         . quinapril (ACCUPRIL) 40 MG tablet   Oral   Take 1 tablet (40 mg total)  by mouth daily.   90 tablet   3   . zolpidem (AMBIEN) 5 MG tablet   Oral   Take 1 tablet (5 mg total) by mouth at bedtime as needed for sleep.   60 tablet   0   . Azelastine-Fluticasone (DYMISTA) 137-50 MCG/ACT SUSP   Nasal   Place 1 spray into the nose 2 (two) times daily.         Marland Kitchen ibuprofen (ADVIL,MOTRIN) 800 MG tablet      TAKE ONE TABLET BY MOUTH EVERY 6 HOURS AS NEEDED   60 tablet   1    BP 194/77  Pulse 100  Temp(Src) 100.2 F (37.9 C) (Oral)  Resp 20  SpO2 96% Physical Exam  Nursing note and vitals reviewed. Constitutional: She appears well-developed and well-nourished. No distress.  HENT:  Head: Normocephalic and atraumatic.  Eyes: Conjunctivae are normal. Right eye exhibits no discharge. Left eye exhibits no discharge.  Neck: Neck supple.  No nuchal rigdity  Cardiovascular: Normal rate, regular rhythm and normal heart sounds.  Exam reveals no gallop and no friction rub.   No murmur heard. Pulmonary/Chest: Effort normal and breath sounds normal. No respiratory distress.  Abdominal: Soft. She  exhibits no distension. There is no tenderness.  Musculoskeletal: She exhibits no edema and no tenderness.  Neurological: She is alert.  Skin: Skin is warm and dry.  Psychiatric: She has a normal mood and affect. Her behavior is normal. Thought content normal.    ED Course  Procedures (including critical care time) Labs Review Labs Reviewed  CBC WITH DIFFERENTIAL - Abnormal; Notable for the following:    WBC 16.0 (*)    Neutrophils Relative % 84 (*)    Neutro Abs 13.4 (*)    Lymphocytes Relative 8 (*)    Monocytes Absolute 1.3 (*)    All other components within normal limits  BASIC METABOLIC PANEL - Abnormal; Notable for the following:    Sodium 130 (*)    Chloride 92 (*)    Glucose, Bld 140 (*)    GFR calc non Af Amer 89 (*)    All other components within normal limits  URINALYSIS, ROUTINE W REFLEX MICROSCOPIC - Abnormal; Notable for the following:    Hgb urine dipstick SMALL (*)    Ketones, ur 15 (*)    Protein, ur >300 (*)    Leukocytes, UA TRACE (*)    All other components within normal limits  URINE MICROSCOPIC-ADD ON - Abnormal; Notable for the following:    Squamous Epithelial / LPF FEW (*)    Bacteria, UA FEW (*)    All other components within normal limits   Imaging Review Dg Chest 2 View  01/09/2013   *RADIOLOGY REPORT*  Clinical Data: Fever and cough.  CHEST - 2 VIEW  Comparison: Chest CT 12/02/2009.  Findings: There are subtle areas of interstitial prominence, most pronounced throughout the mid and lower lungs bilaterally, with probable mild cylindrical bronchiectasis throughout the right middle lobe and lingula.  Some tiny nodular opacities are also noted predominately throughout the mid and lower lungs bilaterally. No confluent consolidative airspace disease.  No pleural effusions. No evidence of pulmonary edema.  Heart size is normal.  Mediastinal contours are unremarkable.  Atherosclerosis of the thoracic aorta.  IMPRESSION: 1.  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 2.  Atherosclerosis.   Original Report Authenticated By: Trudie Reed, M.D.   Ct Chest Wo Contrast  01/09/2013   *RADIOLOGY REPORT*  Clinical Data: Fever.  Cough.  Headache.  Possible atypical infection.  CT CHEST WITHOUT CONTRAST  Technique:  Multidetector CT imaging of the chest was performed following the standard protocol without IV contrast.  Comparison: Chest CT 12/02/2009.  Findings:  Mediastinum: Heart size is normal. There is no significant pericardial fluid, thickening or pericardial calcification. There is atherosclerosis of the thoracic aorta, the great vessels of the mediastinum and the coronary arteries, including calcified atherosclerotic plaque in the left main, left anterior descending, left circumflex and right coronary arteries. No pathologically enlarged mediastinal or hilar lymph nodes. Please note that accurate exclusion of hilar adenopathy is limited on noncontrast CT scans.  Small hiatal hernia.  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  The extent of the pulmonary involvement has significantly increased compared to the prior examination, and findings are most severe within the right middle lobe and inferior segment of the lingula.  The largest single nodule is again noted in the posterior aspect of the right lower lobe (image 31 of series 4), but appears unchanged in size compared the prior examination measuring only 5-6 mm (stability in size over greater than 2-year time interval is compatible with  a benign nodule).  No confluent consolidative airspace disease is noted.  No pleural effusions.  Upper Abdomen: Unremarkable.  Musculoskeletal: As with the prior examination there is a focal area of architectural distortion measuring 14 x 9 mm with some coarse calcification in the deep soft tissues of the left breast abutting the left pectoralis major musculature (image 33 of series 2), favored to represent an area of scarring and evolving fat necrosis. Compression fracture at T8 with approximately 25% loss of anterior vertebral body height is unchanged compared to the prior study. There are no aggressive appearing lytic or blastic lesions noted in the visualized portions of the skeleton.  IMPRESSION: 1.  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  2.  Atherosclerosis, including left main and three-vessel coronary artery disease.   Assessment for potential risk factor modification, dietary therapy or pharmacologic therapy may be warranted, if clinically indicated. 3.  Additional incidental findings, similar to the prior study, as above.   Original Report Authenticated By: Trudie Reed, M.D.    MDM   1. Community acquired pneumonia     72yF with multiple complaints. Sent from PCP she says for the evaluation of possible meningitis. My suspicion for this is low. She does have a HA. Frontal/facial. No neck pain/stiffness. No nuchal rigidity on exam. Nontoxic. Increased urinary frequency, but UA not strongly suggestive of UTI. Does have a few bacteria, but also a few squam cells. Only trace leuks, WBC/RBC within normal range. Reports cough occasionally productive. CXR with appearance of atypical in infection. On further questioning pt reiterating that symptoms fairly acute since Tuesday and denies more prolonged symptoms of cough, fatigue, night sweats, weight  loss, etc. Does have hx of breast s/p resection, radiation and chemo. Does not appear to have had active tx in years though. Will obtain further studies including CT  to further eval. Multiple other pain complaints (back, leg, etc.) which is suspect simply related to her febrile illness. Will continue to tx symptomatically pending results of further tests.   Further imaging as above. May be indolent infection with pulmonary findings noted on prior imaging as far back as 2009. Not clear if current symptoms manifestation of this though. Will discuss with ID.  8:46 PM Discussed with Dr Orvan Falconer, ID. Would tx as CAP at this point. With her allergies, will give quinolone. Further studies ordered.  Outpt FU with ID.   Raeford Razor, MD 01/17/13 [phone]

## 2013-01-09 NOTE — ED Notes (Signed)
AFB stain and culture with smear ok to run with the sputum sample. Cleared by Marcelle Smiling in lab.

## 2013-01-10 LAB — HEPATIC FUNCTION PANEL
ALT: <8 U/L (ref 0–35)
AST: 14 U/L (ref 0–37)
Albumin: 4.3 g/dL (ref 3.5–5.2)
Alkaline Phosphatase: 62 U/L (ref 39–117)
Indirect Bilirubin: 1.1 mg/dL — ABNORMAL HIGH (ref 0.0–0.9)
Total Protein: 6.9 g/dL (ref 6.0–8.3)

## 2013-01-10 LAB — BASIC METABOLIC PANEL
BUN: 12 mg/dL (ref 6–23)
Calcium: 9.8 mg/dL (ref 8.4–10.5)
Chloride: 95 mEq/L — ABNORMAL LOW (ref 96–112)
Creat: 0.59 mg/dL (ref 0.50–1.10)

## 2013-01-13 ENCOUNTER — Telehealth: Payer: Self-pay | Admitting: Family Medicine

## 2013-01-13 DIAGNOSIS — G8929 Other chronic pain: Secondary | ICD-10-CM

## 2013-01-13 MED ORDER — OXYCODONE-ACETAMINOPHEN 5-325 MG PO TABS: ORAL_TABLET | ORAL | Status: DC

## 2013-01-13 NOTE — Telephone Encounter (Signed)
Ok to refill x 1  

## 2013-01-13 NOTE — Telephone Encounter (Signed)
Patient is calling to request a new Oxycodone script. Please call when ready.

## 2013-01-13 NOTE — Telephone Encounter (Signed)
Last seen 01/09/13 and filled 12/16/12#90. Please advise     KP

## 2013-01-13 NOTE — Telephone Encounter (Signed)
Patient aware Rx ready for pick up.      KP 

## 2013-01-19 ENCOUNTER — Other Ambulatory Visit: Payer: Self-pay | Admitting: Family Medicine

## 2013-01-21 LAB — AFB CULTURE, BLOOD

## 2013-01-22 ENCOUNTER — Ambulatory Visit (INDEPENDENT_AMBULATORY_CARE_PROVIDER_SITE_OTHER): Payer: Medicare Other | Admitting: Infectious Disease

## 2013-01-22 ENCOUNTER — Encounter: Payer: Self-pay | Admitting: Infectious Disease

## 2013-01-22 VITALS — BP 162/87 | HR 53 | Temp 98.2°F | Ht 69.0 in | Wt 176.2 lb

## 2013-01-22 DIAGNOSIS — IMO0001 Reserved for inherently not codable concepts without codable children: Secondary | ICD-10-CM

## 2013-01-22 DIAGNOSIS — R5381 Other malaise: Secondary | ICD-10-CM

## 2013-01-22 DIAGNOSIS — M25569 Pain in unspecified knee: Secondary | ICD-10-CM

## 2013-01-22 DIAGNOSIS — R918 Other nonspecific abnormal finding of lung field: Secondary | ICD-10-CM | POA: Insufficient documentation

## 2013-01-22 DIAGNOSIS — I25810 Atherosclerosis of coronary artery bypass graft(s) without angina pectoris: Secondary | ICD-10-CM

## 2013-01-22 DIAGNOSIS — C50919 Malignant neoplasm of unspecified site of unspecified female breast: Secondary | ICD-10-CM

## 2013-01-22 DIAGNOSIS — B389 Coccidioidomycosis, unspecified: Secondary | ICD-10-CM

## 2013-01-22 DIAGNOSIS — M25561 Pain in right knee: Secondary | ICD-10-CM

## 2013-01-22 DIAGNOSIS — J189 Pneumonia, unspecified organism: Secondary | ICD-10-CM

## 2013-01-22 DIAGNOSIS — Z113 Encounter for screening for infections with a predominantly sexual mode of transmission: Secondary | ICD-10-CM

## 2013-01-22 NOTE — Progress Notes (Signed)
Subjective:    Patient ID: Amanda Joyce, female    DOB: [birth year], 72 y.o.   MRN: [id number]  HPI  72 year old Caucasian lady originally from [place] who moved to [place] with her husband and lived there for 30 years prior chills when moving to [facility] ([place] She was diagnosed with breast cancer and had curative surgery and chemotherapy in 2006. She has been disease-free of her cancer since then. However she has had problems with malaise and diffuse myalgias and joint pains that have required daily use of controlled oxycodone since then. She has had several CT scans of her chest done over the years from 2008 through 2011 2014. The CT scan had noted pulmonary nodules that remain stable in appearance. Approximately 2 weeks ago the patient had become abruptly ill with high fevers headaches as well as low back pain. She was concerned that she might either have a sinus infection or urinary tract infection. She saw her primary care physician Dr. Laury Axon and ultimately was seen in the emergency department. Urine cultures were unremarkable urinalysis unremarkable. Chest x-ray showed increase in number of pulmonary nodules seen on prior imaging. CT scan of the tip of the lungs was done and showed:  Findings:  Mediastinum: Heart size is normal. There is no significant  pericardial fluid, thickening or pericardial calcification. There  is atherosclerosis of the thoracic aorta, the great vessels of the  mediastinum and the coronary arteries, including calcified  atherosclerotic plaque in the left main, left anterior descending,  left circumflex and right coronary arteries. No pathologically  enlarged mediastinal or hilar lymph nodes. Please note that  accurate exclusion of hilar adenopathy is limited on noncontrast CT  scans. Small hiatal hernia.  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 The extent of the pulmonary  involvement has significantly increased compared to the prior  examination, and findings are most severe within the right middle  lobe and inferior segment of the lingula. The largest single  nodule is again noted in the posterior aspect of the right lower  lobe (image 31 of series 4), but appears unchanged in size compared  the prior examination measuring only 5-6 mm (stability in size over  greater than 2-year time interval is compatible with a benign  nodule). No confluent consolidative airspace disease is noted. No  pleural effusions.  Upper Abdomen: Unremarkable.  Musculoskeletal: As with the prior examination there is a focal  area of architectural distortion measuring 14 x 9 mm with some  coarse calcification in the deep soft tissues of the left breast  abutting the left pectoralis major musculature (image 33 of series  2), favored to represent an area of scarring and evolving fat  necrosis. Compression fracture at T8 with approximately 25% loss of  anterior vertebral body height is unchanged compared to the prior  study. There are no aggressive appearing lytic or blastic lesions  noted in the visualized portions of the skeleton.  IMPRESSION:  1. 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  2. Atherosclerosis, including left main and three-vessel coronary  artery disease. Assessment for potential risk factor  modification, dietary therapy or pharmacologic therapy may be  warranted,  if clinically indicated.  3. Additional incidental findings, similar to the prior study, as  above.   Sputum was induced and she was able to produce  sputum that was only sent for AFB culture AFB stain was negative. She was given a prescription for levofloxacin which he took it wouldn't room in in her symptoms of fever. She was ultimately referred to our clinic due to concerns that she may have an atypical infection that would correspond to the, I nodules are progressed after her chemotherapy. Of note she did have Prior diagnosis of "walking pneumonia on 2 different occasions while living in [place]. She has no known exposure to tuberculosis. She is divorced and has not been sexual active for many years.  Review of Systems  Constitutional: Positive for fever, chills, diaphoresis and fatigue. Negative for activity change, appetite change and unexpected weight change.  HENT: Negative for congestion, sore throat, rhinorrhea, sneezing, trouble swallowing and sinus pressure.   Eyes: Negative for photophobia and visual disturbance.  Respiratory: Negative for cough, chest tightness, shortness of breath, wheezing and stridor.   Cardiovascular: Negative for chest pain, palpitations and leg swelling.  Gastrointestinal: Negative for nausea, vomiting, abdominal pain, diarrhea, constipation, blood in stool, abdominal distention and anal bleeding.  Genitourinary: Negative for dysuria, hematuria, flank pain and difficulty urinating.  Musculoskeletal: Positive for myalgias and arthralgias. Negative for back pain, joint swelling and gait problem.  Skin: Positive for rash. Negative for color change, pallor and wound.  Neurological: Positive for weakness. Negative for dizziness, tremors and light-headedness.  Hematological: Negative for adenopathy. Does not bruise/bleed easily.  Psychiatric/Behavioral: Negative for behavioral problems, confusion, sleep disturbance, dysphoric mood, decreased concentration and agitation.       Objective:   Physical Exam  Constitutional: She is oriented to person, place, and time. She appears well-developed and well-nourished. No  distress.  HENT:  Head: Normocephalic and atraumatic.  Mouth/Throat: Oropharynx is clear and moist. No oropharyngeal exudate.  Eyes: Conjunctivae and EOM are normal. Pupils are equal, round, and reactive to light. No scleral icterus.  Neck: Normal range of motion. Neck supple. No JVD present.  Cardiovascular: Normal rate, regular rhythm and normal heart sounds.  Exam reveals no gallop and no friction rub.   No murmur heard. Pulmonary/Chest: Effort normal and breath sounds normal. No respiratory distress. She has no wheezes. She has no rales. She exhibits no tenderness.  Abdominal: She exhibits no distension and no mass. There is no tenderness. There is no rebound and no guarding.  Musculoskeletal: She exhibits no edema and no tenderness.  Lymphadenopathy:    She has no cervical adenopathy.  Neurological: She is alert and oriented to person, place, and time. She exhibits normal muscle tone. Coordination normal.  Skin: Skin is warm and dry. She is not diaphoretic. No erythema. No pallor.     Psychiatric: She has a normal mood and affect. Her behavior is normal. Judgment and thought content normal.          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 I expect her pulmonary nodules are due to this an endemic fungus from [place].   Certainly the differential diagnosis for these nodules would include other endemic fungi such  as cryptococcus histoplasmosis Blastomyces and would include atypical mycobacteria such as Mycobacterium avium. Her symptom complex however does not fit well at all for Mycobacterium avium infection of the lungs given the lack of cough lack of weight loss. Her AFBs  and cultures have been sent but smears have been negative to date.  And one to send off serologies for coccidioidomycosis by immunodiffusion and complement fixation to the [facility] laboratory which has the most reliable test for this serology. Also send Coccidioides antigen in the urine to [facility] labs along with urine histo and blasto antigens. 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  #2 CAD: seen on CT chest. Pt on statin. 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  #4 STD screen: check HIV  #5 ? Recent superimposed CAP given improvement with FQ  #6 knee pain: worsened on FQ more likely an incidental finding.  #7 health care maintenance offered influenza vaccine but patient refused, she was intolerant to that seen previously.

## 2013-01-27 ENCOUNTER — Other Ambulatory Visit (INDEPENDENT_AMBULATORY_CARE_PROVIDER_SITE_OTHER): Payer: Medicare Other

## 2013-01-27 DIAGNOSIS — R918 Other nonspecific abnormal finding of lung field: Secondary | ICD-10-CM

## 2013-01-27 DIAGNOSIS — B389 Coccidioidomycosis, unspecified: Secondary | ICD-10-CM

## 2013-01-27 NOTE — Addendum Note (Signed)
Addended by: Jennet Maduro D on: 01/27/2013 02:18 PM   Modules accepted: Orders

## 2013-01-28 LAB — HIV-1 RNA QUANT-NO REFLEX-BLD: HIV-1 RNA Quant, Log: <1.3 {Log} (ref <1.30)

## 2013-01-28 LAB — CRYPTOCOCCAL ANTIGEN: Crypto Ag: NEGATIVE

## 2013-01-28 NOTE — Addendum Note (Signed)
Addended by: Mariea Clonts D on: 01/28/2013 09:35 AM   Modules accepted: Orders

## 2013-01-30 LAB — COCCIDIOIDES ANTIBODIES: Coccidioides Ab CF: <1:2 {titer}

## 2013-02-06 ENCOUNTER — Telehealth: Payer: Self-pay | Admitting: Family Medicine

## 2013-02-06 DIAGNOSIS — I10 Essential (primary) hypertension: Secondary | ICD-10-CM

## 2013-02-06 DIAGNOSIS — E785 Hyperlipidemia, unspecified: Secondary | ICD-10-CM

## 2013-02-06 DIAGNOSIS — E119 Type 2 diabetes mellitus without complications: Secondary | ICD-10-CM

## 2013-02-06 NOTE — Telephone Encounter (Signed)
Pt is coming in for Lipids. Needs orders.

## 2013-02-09 ENCOUNTER — Other Ambulatory Visit: Payer: Self-pay | Admitting: Family Medicine

## 2013-02-09 NOTE — Telephone Encounter (Signed)
Last seen 01/09/13 and filled 09/15/12 #60. Please advise     KP

## 2013-02-10 ENCOUNTER — Other Ambulatory Visit (INDEPENDENT_AMBULATORY_CARE_PROVIDER_SITE_OTHER): Payer: Medicare Other

## 2013-02-10 DIAGNOSIS — I10 Essential (primary) hypertension: Secondary | ICD-10-CM

## 2013-02-10 DIAGNOSIS — E785 Hyperlipidemia, unspecified: Secondary | ICD-10-CM

## 2013-02-10 LAB — BASIC METABOLIC PANEL
BUN: 19 mg/dL (ref 6–23)
CO2: 29 mEq/L (ref 19–32)
Chloride: 104 mEq/L (ref 96–112)
Creatinine, Ser: 0.8 mg/dL (ref 0.4–1.2)
Glucose, Bld: 105 mg/dL — ABNORMAL HIGH (ref 70–99)

## 2013-02-10 LAB — HEPATIC FUNCTION PANEL
AST: 16 U/L (ref 0–37)
Alkaline Phosphatase: 49 U/L (ref 39–117)
Bilirubin, Direct: 0 mg/dL (ref 0.0–0.3)

## 2013-02-10 LAB — LIPID PANEL
LDL Cholesterol: 95 mg/dL (ref 0–99)
Total CHOL/HDL Ratio: 4

## 2013-02-12 ENCOUNTER — Telehealth: Payer: Self-pay | Admitting: *Deleted

## 2013-02-12 ENCOUNTER — Telehealth: Payer: Self-pay | Admitting: Family Medicine

## 2013-02-12 DIAGNOSIS — G8929 Other chronic pain: Secondary | ICD-10-CM

## 2013-02-12 MED ORDER — OXYCODONE-ACETAMINOPHEN 5-325 MG PO TABS: ORAL_TABLET | ORAL | Status: DC

## 2013-02-12 MED ORDER — PRAVASTATIN SODIUM 20 MG PO TABS: 20.0000 mg | ORAL_TABLET | Freq: Every day | ORAL | Status: DC

## 2013-02-12 NOTE — Telephone Encounter (Signed)
Last seen 01/09/13 and filled 01/13/13 #90. Please advise     KP

## 2013-02-12 NOTE — Telephone Encounter (Signed)
Patient is calling to request a new Oxycodone script #90. Please call when ready.  She also is asking to speak with Selena Batten about two of her other prescriptions. Did not want to give details. Please advise.

## 2013-02-12 NOTE — Telephone Encounter (Signed)
Refill x1 

## 2013-02-12 NOTE — Telephone Encounter (Signed)
Patient aware Rx ready for pick up.    KP  She said [place] should have been for #90. I will update in her chart.

## 2013-02-12 NOTE — Telephone Encounter (Signed)
Workplace accomodation paperwork given to provider for completion as she is very familiar with this patient.

## 2013-02-22 LAB — AFB CULTURE WITH SMEAR (NOT AT ARMC)
Acid Fast Smear: NONE SEEN
Special Requests: NORMAL

## 2013-03-04 ENCOUNTER — Encounter: Payer: Self-pay | Admitting: Infectious Disease

## 2013-03-04 ENCOUNTER — Ambulatory Visit (INDEPENDENT_AMBULATORY_CARE_PROVIDER_SITE_OTHER): Payer: Medicare Other | Admitting: Infectious Disease

## 2013-03-04 ENCOUNTER — Encounter: Payer: Medicare Other | Admitting: Infectious Disease

## 2013-03-04 VITALS — BP 169/80 | HR 59 | Temp 98.0°F | Ht 69.0 in | Wt 179.0 lb

## 2013-03-04 DIAGNOSIS — R5381 Other malaise: Secondary | ICD-10-CM

## 2013-03-04 DIAGNOSIS — C50919 Malignant neoplasm of unspecified site of unspecified female breast: Secondary | ICD-10-CM

## 2013-03-04 DIAGNOSIS — I2510 Atherosclerotic heart disease of native coronary artery without angina pectoris: Secondary | ICD-10-CM

## 2013-03-04 DIAGNOSIS — IMO0001 Reserved for inherently not codable concepts without codable children: Secondary | ICD-10-CM

## 2013-03-04 DIAGNOSIS — I25810 Atherosclerosis of coronary artery bypass graft(s) without angina pectoris: Secondary | ICD-10-CM

## 2013-03-04 DIAGNOSIS — B389 Coccidioidomycosis, unspecified: Secondary | ICD-10-CM

## 2013-03-04 DIAGNOSIS — R911 Solitary pulmonary nodule: Secondary | ICD-10-CM

## 2013-03-04 NOTE — Progress Notes (Signed)
Subjective:    Patient ID: Amanda Joyce, female    DOB: 03-17-1941, 72 y.o.   MRN: [id number]  HPI   72 year old Caucasian lady originally from [place] who moved to [place] with her husband and lived there for 30 years prior chills when moving to [facility]. She was diagnosed with breast cancer and had curative surgery and chemotherapy in 2006. She has been disease-free of her cancer since then. However she has had problems with malaise and diffuse myalgias and joint pains that have required daily use of controlled oxycodone since then. She has had several CT scans of her chest done over the years from 2008 through 2011 2014. The CT scan had noted pulmonary nodules that remain stable in appearance. Approximately 2 weeks ago the patient had become abruptly ill with high fevers headaches as well as low back pain. She was concerned that she might either have a sinus infection or urinary tract infection. She saw her primary care physician Dr. Laury Axon and ultimately was seen in the emergency department. Urine cultures were unremarkable urinalysis unremarkable. Chest x-ray showed increase in number of pulmonary nodules seen on prior imaging. CT scan of the tip of the lungs was done and showed:  Findings:  Mediastinum: Heart size is normal. There is no significant  pericardial fluid, thickening or pericardial calcification. There  is atherosclerosis of the thoracic aorta, the great vessels of the  mediastinum and the coronary arteries, including calcified  atherosclerotic plaque in the left main, left anterior descending,  left circumflex and right coronary arteries. No pathologically  enlarged mediastinal or hilar lymph nodes. Please note that  accurate exclusion of hilar adenopathy is limited on noncontrast CT  scans. Small hiatal hernia.  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 The extent of the pulmonary  involvement has significantly increased compared to the prior  examination, and findings are most severe within the right middle  lobe and inferior segment of the lingula. The largest single  nodule is again noted in the posterior aspect of the right lower  lobe (image 31 of series 4), but appears unchanged in size compared  the prior examination measuring only 5-6 mm (stability in size over  greater than 2-year time interval is compatible with a benign  nodule). No confluent consolidative airspace disease is noted. No  pleural effusions.  Upper Abdomen: Unremarkable.  Musculoskeletal: As with the prior examination there is a focal  area of architectural distortion measuring 14 x 9 mm with some  coarse calcification in the deep soft tissues of the left breast  abutting the left pectoralis major musculature (image 33 of series  2), favored to represent an area of scarring and evolving fat  necrosis. Compression fracture at T8 with approximately 25% loss of  anterior vertebral body height is unchanged compared to the prior  study. There are no aggressive appearing lytic or blastic lesions  noted in the visualized portions of the skeleton.  IMPRESSION:  1. 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  2. Atherosclerosis, including left main and three-vessel coronary  artery disease. Assessment for potential risk factor  modification, dietary therapy or pharmacologic therapy may be  warranted, if clinically indicated.  3. Additional incidental findings, similar to the prior study, as  above.   Sputum was induced and she was able to produce  sputum that was only sent for AFB culture AFB stain was negative. She was given a prescription for levofloxacin which he took it wouldn't room in in her symptoms of fever. She was ultimately referred to our clinic due to concerns that she may have an atypical infection that would correspond to the, I nodules are progressed after her chemotherapy. Of note she did have Prior diagnosis of "walking pneumonia on 2 different occasions while living in [place]. She has no known exposure to tuberculosis  I was concerned that her slowly progressive nodular lung disease, likely represented a chronic fungal infection, early given her history living in [place] I was concerned for possibility of coccidiomycosis.  We obtained antibodies to Coccidioides which were negative by complement fixation when [facility]. We obtained urine cocci antigen, and blastomyces antigens which were negative. Crypto ag serum was negative  I DO NOT SEE ON further review that histoplasma antigen in urine was done. I also had wished for serum to have been sent to [facility] lab for Cocci serologies but this was not done.   During her visit was not aware that not all of the desired tests had all been resulted. I proposed to her several options provided that all testing that we can do through serologies and urine testing were negative.  She did not want a therapeutic trial with azole not invasive study at this point.    The first option is watchful waiting and repeat a CT scan in approximately 4-6 months time. In interval I would like herr see a pulmonary physician so that should her lesions progress a bronchoscopy could be performed for AFB cultures fungal cultures and bacterial cultures and possibly biopsy.  Review of Systems  Constitutional: Positive for diaphoresis and fatigue. Negative for activity change, appetite change and unexpected weight change.  HENT: Negative for congestion, rhinorrhea, sinus pressure, sneezing, sore throat  and trouble swallowing.   Eyes: Negative for photophobia and visual disturbance.  Respiratory: Negative for cough, chest tightness, shortness of breath, wheezing and stridor.   Cardiovascular: Negative for chest pain, palpitations and leg swelling.  Gastrointestinal: Negative for nausea, vomiting, abdominal pain, diarrhea, constipation, blood in stool, abdominal distention and anal bleeding.  Genitourinary: Negative for dysuria, hematuria, flank pain and difficulty urinating.  Musculoskeletal: Positive for arthralgias and myalgias. Negative for back pain, gait problem and joint swelling.  Skin: Positive for rash. Negative for color change, pallor and wound.  Neurological: Negative for dizziness, tremors, weakness and light-headedness.  Hematological: Negative for adenopathy. Does not bruise/bleed easily.  Psychiatric/Behavioral: Negative for behavioral problems, confusion, sleep disturbance, dysphoric mood, decreased concentration and agitation.       Objective:   Physical Exam  Constitutional: She is oriented to person, place, and time. She appears well-developed and well-nourished. No distress.  HENT:  Head: Normocephalic and atraumatic.  Mouth/Throat: Oropharynx is clear and moist. No oropharyngeal exudate.  Eyes: Conjunctivae and EOM are normal. Pupils are equal, round, and reactive to light. No scleral icterus.  Neck: Normal range of motion. Neck supple. No JVD present.  Cardiovascular: Normal rate, regular rhythm and normal heart sounds.  Exam reveals no gallop and no friction rub.   No murmur heard. Pulmonary/Chest: Effort normal and breath sounds normal. No respiratory distress. She has no wheezes. She has no rales. She exhibits no tenderness.  Abdominal: She exhibits no distension and no mass. There is no tenderness. There is no rebound and no guarding.  Musculoskeletal: She exhibits no edema and no tenderness.  Lymphadenopathy:    She has no cervical adenopathy.  Neurological:  She is alert and oriented to person, place, and time. She exhibits normal muscle tone. Coordination normal.  Skin: Skin is warm and dry. She is not diaphoretic. No erythema. No pallor.     Psychiatric: She has a normal mood and affect. Her behavior is normal. Judgment and thought content normal.          Assessment & Plan:   #1 Pulmonary nodules: Given the fact that she lived in [place] for more than 30 years her pretest probability of having been infected with coccidiomycosis was  very high.  Her Cocci abs to [facility] by [facility] were negative. I do not see Cocci abs by ID nor labs sent to [facility]. Her cocci antigen in urine and blasto ag in urine were negative and her serum crypto ag was negative. I do not see that a urine histo ag was done, nor histo antibodies   If these tests have not been run will bring her back for the missing tests and also  check ESR, and ACE level  IF these are all negative, will repeat CT in 4-5 months and make sure she is plugged in with Pulmolnary MD  I spent greater than 45 minutes with the patient including greater than 50% of time in face to face counsel of the patient and in coordination of their care.   #2 CAD: seen on CT chest. Pt on statin. 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  #4 STD screen:  HIV negative

## 2013-03-05 ENCOUNTER — Other Ambulatory Visit: Payer: Self-pay | Admitting: Licensed Clinical Social Worker

## 2013-03-05 DIAGNOSIS — R911 Solitary pulmonary nodule: Secondary | ICD-10-CM

## 2013-03-16 ENCOUNTER — Telehealth: Payer: Self-pay | Admitting: Family Medicine

## 2013-03-16 DIAGNOSIS — G8929 Other chronic pain: Secondary | ICD-10-CM

## 2013-03-16 NOTE — Telephone Encounter (Signed)
Patient called to request a 90 day supply oxyCODONE-acetaminophen (PERCOCET/ROXICET) 5-325 MG per tablet   thanks

## 2013-03-17 ENCOUNTER — Encounter: Payer: Self-pay | Admitting: Family Medicine

## 2013-03-17 MED ORDER — OXYCODONE-ACETAMINOPHEN 5-325 MG PO TABS: ORAL_TABLET | ORAL | Status: DC

## 2013-03-17 NOTE — Telephone Encounter (Signed)
Patient is a patient of Dr. Laury Axon and is requesting

## 2013-03-17 NOTE — Telephone Encounter (Signed)
Patient called requesting to speak with [facility].

## 2013-03-17 NOTE — Telephone Encounter (Signed)
Patient is patient of Dr. Laury Axon and is requesting refill for Percocet.   Last seen on 01/09/2013. Last filled on 02/12/2013, #90 0 refills, UDS on 02/10/2013, contract has been signed. Please advise. SW

## 2013-03-17 NOTE — Telephone Encounter (Signed)
Done

## 2013-03-31 ENCOUNTER — Telehealth: Payer: Self-pay | Admitting: Family Medicine

## 2013-03-31 MED ORDER — ZOLPIDEM TARTRATE 5 MG PO TABS: ORAL_TABLET | ORAL | Status: DC

## 2013-03-31 NOTE — Telephone Encounter (Signed)
Rx faxed.    KP 

## 2013-03-31 NOTE — Telephone Encounter (Signed)
Patient called and requested a refill for zolpidem (AMBIEN) 5 MG tablet  Pharmacy [id number] - [place], [place] - [address]

## 2013-03-31 NOTE — Telephone Encounter (Signed)
Refill x1 

## 2013-03-31 NOTE — Telephone Encounter (Signed)
Last seen 01/09/13 and filled 02/09/13 #60. Please advise       KP

## 2013-04-17 ENCOUNTER — Ambulatory Visit (INDEPENDENT_AMBULATORY_CARE_PROVIDER_SITE_OTHER): Payer: Medicare Other | Admitting: Family Medicine

## 2013-04-17 ENCOUNTER — Encounter: Payer: Self-pay | Admitting: Family Medicine

## 2013-04-17 VITALS — BP 126/78 | HR 63 | Temp 98.1°F | Wt 180.0 lb

## 2013-04-17 DIAGNOSIS — F411 Generalized anxiety disorder: Secondary | ICD-10-CM

## 2013-04-17 DIAGNOSIS — L259 Unspecified contact dermatitis, unspecified cause: Secondary | ICD-10-CM

## 2013-04-17 DIAGNOSIS — E785 Hyperlipidemia, unspecified: Secondary | ICD-10-CM

## 2013-04-17 DIAGNOSIS — M1990 Unspecified osteoarthritis, unspecified site: Secondary | ICD-10-CM

## 2013-04-17 DIAGNOSIS — G4700 Insomnia, unspecified: Secondary | ICD-10-CM

## 2013-04-17 DIAGNOSIS — I10 Essential (primary) hypertension: Secondary | ICD-10-CM

## 2013-04-17 DIAGNOSIS — G8929 Other chronic pain: Secondary | ICD-10-CM

## 2013-04-17 DIAGNOSIS — L309 Dermatitis, unspecified: Secondary | ICD-10-CM

## 2013-04-17 MED ORDER — QUINAPRIL HCL 40 MG PO TABS: 40.0000 mg | ORAL_TABLET | Freq: Every day | ORAL | Status: DC

## 2013-04-17 MED ORDER — TRIAMCINOLONE ACETONIDE 0.1 % EX CREA: 1.0000 "application " | TOPICAL_CREAM | Freq: Two times a day (BID) | CUTANEOUS | Status: DC

## 2013-04-17 MED ORDER — PRAVASTATIN SODIUM 20 MG PO TABS: 20.0000 mg | ORAL_TABLET | Freq: Every day | ORAL | Status: DC

## 2013-04-17 MED ORDER — TETANUS-DIPHTH-ACELL PERTUSSIS 5-2.5-18.5 LF-MCG/0.5 IM SUSP: 0.5000 mL | Freq: Once | INTRAMUSCULAR | Status: DC

## 2013-04-17 MED ORDER — DILTIAZEM HCL ER COATED BEADS 240 MG PO CP24: 240.0000 mg | ORAL_CAPSULE | Freq: Every day | ORAL | Status: DC

## 2013-04-17 MED ORDER — OXYCODONE-ACETAMINOPHEN 5-325 MG PO TABS: ORAL_TABLET | ORAL | Status: DC

## 2013-04-17 MED ORDER — ZOLPIDEM TARTRATE 5 MG PO TABS: ORAL_TABLET | ORAL | Status: DC

## 2013-04-17 MED ORDER — ZOSTER VACCINE LIVE 19400 UNT/0.65ML ~~LOC~~ SOLR: 0.6500 mL | Freq: Once | SUBCUTANEOUS | Status: DC

## 2013-04-17 MED ORDER — ALPRAZOLAM 0.25 MG PO TABS: 0.2500 mg | ORAL_TABLET | Freq: Three times a day (TID) | ORAL | Status: DC | PRN

## 2013-04-17 NOTE — Progress Notes (Signed)
Pre visit review using our clinic review tool, if applicable. No additional management support is needed unless otherwise documented below in the visit note. 

## 2013-04-17 NOTE — Patient Instructions (Signed)

## 2013-04-18 DIAGNOSIS — G4700 Insomnia, unspecified: Secondary | ICD-10-CM | POA: Insufficient documentation

## 2013-04-18 NOTE — Progress Notes (Signed)
   Subjective:    Patient ID: Amanda Joyce, female    DOB: 03/08/1941, 72 y.o.   MRN: [id number]  HPI Pt here for f/u and med refills.  No new complaints.    Review of Systems As above     Objective:   Physical Exam BP 126/78  Pulse 63  Temp(Src) 98.1 F (36.7 C) (Oral)  Wt 180 lb (81.647 kg)  SpO2 95% General appearance: alert, cooperative, appears stated age and no distress Neck: no adenopathy, supple, symmetrical, trachea midline and thyroid not enlarged, symmetric, no tenderness/mass/nodules Lungs: clear to auscultation bilaterally Heart: S1, S2 normal Extremities: extremities normal, atraumatic, no cyanosis or edema       Assessment & Plan:

## 2013-04-18 NOTE — Assessment & Plan Note (Signed)
Refill pain meds Pt doing well with using stool at work

## 2013-04-18 NOTE — Assessment & Plan Note (Signed)
con't meds F/u for labs

## 2013-04-18 NOTE — Assessment & Plan Note (Signed)
Refill meds

## 2013-04-18 NOTE — Assessment & Plan Note (Signed)
Refill xanax

## 2013-04-18 NOTE — Assessment & Plan Note (Signed)
Stable con't meds 

## 2013-05-19 ENCOUNTER — Telehealth: Payer: Self-pay | Admitting: *Deleted

## 2013-05-19 DIAGNOSIS — G8929 Other chronic pain: Secondary | ICD-10-CM

## 2013-05-19 MED ORDER — OXYCODONE-ACETAMINOPHEN 5-325 MG PO TABS: ORAL_TABLET | ORAL | Status: DC

## 2013-05-19 NOTE — Telephone Encounter (Signed)
Refill x1 

## 2013-05-19 NOTE — Telephone Encounter (Signed)
Last seen and filled 04/17/13 #90 UDS 02/10/13.     Please advise     KP

## 2013-05-19 NOTE — Telephone Encounter (Signed)
patient aware RX ready for pick up tomorrow....  KP

## 2013-05-19 NOTE — Telephone Encounter (Signed)
Patient called and requested a refill for oxyCODONE-acetaminophen (PERCOCET/ROXICET) 5-325 MG per tablet ° °

## 2013-06-03 ENCOUNTER — Telehealth: Payer: Self-pay | Admitting: *Deleted

## 2013-06-03 NOTE — Telephone Encounter (Signed)
Patient called to find out when her Chest CT and pulmonology referral are. Advised her not able to see in the system but will check with Sharyn Lull and give her a call back tomorrow 06/04/13 with an answer.

## 2013-06-08 NOTE — Addendum Note (Signed)
Addended by: Landis Gandy on: 06/08/2013 09:15 AM   Modules accepted: Orders

## 2013-06-10 NOTE — Telephone Encounter (Signed)
Patient scheduled for Chest CT on 2/12 11:00 (10:00 arrival for labs) and with Dr. Joya Gaskins at [facility] on 2/16 at 2:30.  Patient notified, given address and phone number [phone]) if she needs to reschedule. Landis Gandy, RN

## 2013-06-15 ENCOUNTER — Telehealth: Payer: Self-pay | Admitting: Licensed Clinical Social Worker

## 2013-06-15 NOTE — Telephone Encounter (Signed)
Kylie from the pre service center at [facility] called requesting pre authorization for a CT of chest scheduled for 2/12. I called and authorization [phone]

## 2013-06-16 ENCOUNTER — Telehealth: Payer: Self-pay | Admitting: Family Medicine

## 2013-06-16 DIAGNOSIS — G8929 Other chronic pain: Secondary | ICD-10-CM

## 2013-06-16 NOTE — Telephone Encounter (Signed)
Patient called requesting rx for oxycodone. Ph [phone]

## 2013-06-17 ENCOUNTER — Institutional Professional Consult (permissible substitution): Payer: Medicare Other | Admitting: Critical Care Medicine

## 2013-06-17 NOTE — Telephone Encounter (Signed)
Last seen  04/17/13 and filled 05/19/13 #90. Please advise     KP

## 2013-06-17 NOTE — Telephone Encounter (Signed)
Refill x1 

## 2013-06-18 ENCOUNTER — Ambulatory Visit (HOSPITAL_COMMUNITY): Payer: Medicare Other

## 2013-06-18 ENCOUNTER — Ambulatory Visit (HOSPITAL_COMMUNITY)
Admission: RE | Admit: 2013-06-18 | Discharge: 2013-06-18 | Disposition: A | Discharge status: Home / Self Care | Payer: Medicare Other | Source: Ambulatory Visit | Attending: Infectious Disease | Admitting: Infectious Disease

## 2013-06-18 DIAGNOSIS — K449 Diaphragmatic hernia without obstruction or gangrene: Secondary | ICD-10-CM | POA: Insufficient documentation

## 2013-06-18 DIAGNOSIS — R918 Other nonspecific abnormal finding of lung field: Secondary | ICD-10-CM | POA: Insufficient documentation

## 2013-06-18 DIAGNOSIS — B389 Coccidioidomycosis, unspecified: Secondary | ICD-10-CM

## 2013-06-18 DIAGNOSIS — I7090 Unspecified atherosclerosis: Secondary | ICD-10-CM | POA: Insufficient documentation

## 2013-06-18 DIAGNOSIS — R911 Solitary pulmonary nodule: Secondary | ICD-10-CM

## 2013-06-18 LAB — CREATININE, SERUM
CREATININE: 0.76 mg/dL (ref 0.50–1.10)
GFR calc Af Amer: >90 mL/min (ref >90)
GFR, EST NON AFRICAN AMERICAN: 82 mL/min — AB (ref >90)

## 2013-06-18 LAB — POCT I-STAT, CHEM 8
BUN: 15 mg/dL (ref 6–23)
Calcium, Ion: 1.34 mmol/L — ABNORMAL HIGH (ref 1.13–1.30)
Chloride: 101 mEq/L (ref 96–112)
Creatinine, Ser: 0.8 mg/dL (ref 0.50–1.10)
Glucose, Bld: 106 mg/dL — ABNORMAL HIGH (ref 70–99)
HEMATOCRIT: 48 % — AB (ref 36.0–46.0)
HEMOGLOBIN: 16.3 g/dL — AB (ref 12.0–15.0)
POTASSIUM: 4.1 meq/L (ref 3.7–5.3)
SODIUM: 141 meq/L (ref 137–147)
TCO2: 27 mmol/L (ref 0–100)

## 2013-06-18 LAB — BUN: BUN: 16 mg/dL (ref 6–23)

## 2013-06-18 MED ORDER — IOHEXOL 300 MG/ML  SOLN
80.0000 mL | Freq: Once | INTRAMUSCULAR | Status: AC | PRN
  Administered 2013-06-18: 80 mL via INTRAVENOUS

## 2013-06-18 MED ORDER — OXYCODONE-ACETAMINOPHEN 5-325 MG PO TABS: ORAL_TABLET | ORAL | Status: DC

## 2013-06-18 NOTE — Telephone Encounter (Signed)
Rx for oxycodone printed and placed on ledge for signature

## 2013-06-22 ENCOUNTER — Encounter: Payer: Self-pay | Admitting: Critical Care Medicine

## 2013-06-22 ENCOUNTER — Ambulatory Visit (INDEPENDENT_AMBULATORY_CARE_PROVIDER_SITE_OTHER): Payer: Medicare Other | Admitting: Critical Care Medicine

## 2013-06-22 VITALS — BP 162/70 | HR 55 | Temp 98.0°F | Ht 68.0 in | Wt 184.4 lb

## 2013-06-22 DIAGNOSIS — R918 Other nonspecific abnormal finding of lung field: Secondary | ICD-10-CM

## 2013-06-22 NOTE — Progress Notes (Signed)
Subjective:    Patient ID: Amanda Joyce, female    DOB: 09-26-1940, 73 y.o.   MRN: [id number]  HPI 73 y.o.F 6 mo ago went to ED, did not take pain med and HTN meds, Went to ED: abn ?PNA.  Rx ABX and ok.  Pt then saw ID MD: abn on lungs. No ABX rx. Pt did live in [place] : 1974, left 2006.  No dx of valley fever while there .  Now CT shows reduction in nodules.   Now: no cough, no fever, no DOE, no chest pain.  No joint pain. No skin rash.  No sinus issues.  occ wheeze in chest Never smoker.  Did smoke THC.  Did have passive smoke exposure.  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  L lumpectomy in 2006    Past Medical History  Diagnosis Date  . Arthritis   . Hypertension   . Depression   . Gout   . Temporomandibular joint disorders, unspecified   . Personal history of malignant neoplasm of breast   . Breast cancer     Left   . Osteoarthrosis, unspecified whether generalized or localized, unspecified site   . Irritable bowel syndrome   . Nontoxic uninodular goiter   . Hyperlipemia   . Hiatal hernia   . GERD (gastroesophageal reflux disease)      Family History  Problem Relation Age of Onset  . Coronary artery disease Father   . Liver cancer Paternal Uncle   . Alcohol abuse Paternal Uncle   . Colon cancer Neg Hx   . Other Mother     deceased from brain tumor     History   Social History  . Marital Status: Divorced    Spouse Name: N/A    Number of Children: 2  . Years of Education: N/A   Occupational History  . Retired     Teacher, early years/pre  .     Social History Main Topics  . Smoking status: Never Smoker   . Smokeless tobacco: Never Used  . Alcohol Use: No  . Drug Use: No  . Sexual Activity: Not on file   Other Topics Concern  . Not on file   Social History Narrative   2 caffeine drinks daily      Allergies  Allergen Reactions  . Clarithromycin     REACTION: NIGHTMARES  .  Influenza Virus Vaccine Split Nausea And Vomiting  . Penicillins     REACTION: ITCHING     Outpatient Prescriptions Prior to Visit  Medication Sig Dispense Refill  . ALPRAZolam (XANAX) 0.25 MG tablet Take 1 tablet (0.25 mg total) by mouth 3 (three) times daily as needed for anxiety.  30 tablet  0  . atenolol (TENORMIN) 50 MG tablet Take 1 tablet (50 mg total) by mouth 2 (two) times daily.  180 tablet  3  . desvenlafaxine (PRISTIQ) 50 MG 24 hr tablet Take 50 mg by mouth daily.      Marland Kitchen diltiazem (CARDIZEM CD) 240 MG 24 hr capsule Take 1 capsule (240 mg total) by mouth daily.  90 capsule  1  . ibuprofen (ADVIL,MOTRIN) 800 MG tablet TAKE ONE TABLET BY MOUTH EVERY 6 HOURS AS NEEDED  60 tablet  1  . ipratropium (ATROVENT) 0.06 % nasal spray Place 2 sprays into the nose 3 (three) times daily as needed.       Marland Kitchen  oxyCODONE-acetaminophen (PERCOCET/ROXICET) 5-325 MG per tablet 1-2 tablets by mouth twice a day as needed  90 tablet  0  . pravastatin (PRAVACHOL) 20 MG tablet Take 1 tablet (20 mg total) by mouth daily.  90 tablet  1  . quinapril (ACCUPRIL) 40 MG tablet Take 1 tablet (40 mg total) by mouth daily.  90 tablet  3  . triamcinolone cream (KENALOG) 0.1 % Apply 1 application topically 2 (two) times daily.  30 g  0  . zolpidem (AMBIEN) 5 MG tablet TAKE ONE TABLET BY MOUTH AT BEDTIME AS NEEDED FOR SLEEP  90 tablet  0  . Azelastine-Fluticasone (DYMISTA) 137-50 MCG/ACT SUSP Place 1 spray into the nose 2 (two) times daily.      . Tdap (BOOSTRIX) 5-2.5-18.5 LF-MCG/0.5 injection Inject 0.5 mLs into the muscle once.  0.5 mL  0  . zoster vaccine live, PF, (ZOSTAVAX) 25956 UNT/0.65ML injection Inject 19,400 Units into the skin once.  1 vial  0   No facility-administered medications prior to visit.      Review of Systems  Constitutional: Positive for fatigue. Negative for fever, chills, diaphoresis, activity change, appetite change and unexpected weight change.  HENT: Positive for sinus pressure. Negative  for congestion, dental problem, ear discharge, ear pain, facial swelling, hearing loss, mouth sores, nosebleeds, postnasal drip, rhinorrhea, sneezing, sore throat, tinnitus, trouble swallowing and voice change.   Eyes: Negative for photophobia, discharge, itching and visual disturbance.  Respiratory: Positive for cough. Negative for apnea, choking, chest tightness, shortness of breath, wheezing and stridor.   Cardiovascular: Negative for chest pain, palpitations and leg swelling.  Gastrointestinal: Negative for nausea, vomiting, abdominal pain, constipation, blood in stool and abdominal distention.  Genitourinary: Positive for frequency and genital sores. Negative for dysuria, urgency, hematuria, flank pain, decreased urine volume and difficulty urinating.  Musculoskeletal: Positive for back pain. Negative for arthralgias, gait problem, joint swelling, myalgias, neck pain and neck stiffness.  Skin: Negative for color change, pallor and rash.  Neurological: Negative for dizziness, tremors, seizures, syncope, speech difficulty, weakness, light-headedness, numbness and headaches.  Hematological: Negative for adenopathy. Does not bruise/bleed easily.  Psychiatric/Behavioral: Positive for sleep disturbance. Negative for confusion and agitation. The patient is not nervous/anxious.        Objective:   Physical Exam Filed Vitals:   06/22/13 1426  BP: 162/70  Pulse: 55  Temp: 98 F (36.7 C)  TempSrc: Oral  Height: 5\' 8"  (1.727 m)  Weight: 184 lb 6.4 oz (83.643 kg)  SpO2: 97%    Gen: Pleasant, well-nourished, in no distress,  normal affect  ENT: No lesions,  mouth clear,  oropharynx clear, no postnasal drip  Neck: No JVD, no TMG, no carotid bruits  Lungs: No use of accessory muscles, no dullness to percussion, clear without rales or rhonchi  Cardiovascular: RRR, heart sounds normal, no murmur or gallops, no peripheral edema  Abdomen: soft and NT, no HSM,  BS normal  Musculoskeletal: No  deformities, no cyanosis or clubbing  Neuro: alert, non focal  Skin: Warm, no lesions or rashes  No results found. CT chests reviewed 01/2013 and 06/2013      Assessment & Plan:   Pulmonary nodules Bilateral pulm nodules in a pt who formerly lived in [place].  Doubt malignancy. NOdules are smaller compared to 01/2013 study Plan Repeat CT Chest one year No further immediate w/u needed    Updated Medication List Outpatient Encounter Prescriptions as of 06/22/2013  Medication Sig  . ALPRAZolam (XANAX) 0.25 MG tablet Take  1 tablet (0.25 mg total) by mouth 3 (three) times daily as needed for anxiety.  Marland Kitchen aspirin 81 MG tablet Take 81 mg by mouth daily.  Marland Kitchen atenolol (TENORMIN) 50 MG tablet Take 1 tablet (50 mg total) by mouth 2 (two) times daily.  . Azelastine-Fluticasone 137-50 MCG/ACT SUSP Place 1 spray into the nose 2 (two) times daily as needed.  . Cholecalciferol (VITAMIN D3) 2000 UNITS capsule Take 4,000 Units by mouth daily.  Marland Kitchen desvenlafaxine (PRISTIQ) 50 MG 24 hr tablet Take 50 mg by mouth daily.  Marland Kitchen diltiazem (CARDIZEM CD) 240 MG 24 hr capsule Take 1 capsule (240 mg total) by mouth daily.  Marland Kitchen ibuprofen (ADVIL,MOTRIN) 800 MG tablet TAKE ONE TABLET BY MOUTH EVERY 6 HOURS AS NEEDED  . ipratropium (ATROVENT) 0.06 % nasal spray Place 2 sprays into the nose 3 (three) times daily as needed.   Marland Kitchen oxyCODONE-acetaminophen (PERCOCET/ROXICET) 5-325 MG per tablet 1-2 tablets by mouth twice a day as needed  . pravastatin (PRAVACHOL) 20 MG tablet Take 1 tablet (20 mg total) by mouth daily.  . quinapril (ACCUPRIL) 40 MG tablet Take 1 tablet (40 mg total) by mouth daily.  Marland Kitchen triamcinolone cream (KENALOG) 0.1 % Apply 1 application topically 2 (two) times daily.  Marland Kitchen zolpidem (AMBIEN) 5 MG tablet TAKE ONE TABLET BY MOUTH AT BEDTIME AS NEEDED FOR SLEEP  . [DISCONTINUED] Azelastine-Fluticasone (DYMISTA) 137-50 MCG/ACT SUSP Place 1 spray into the nose 2 (two) times daily.  . [DISCONTINUED] Tdap (BOOSTRIX)  5-2.5-18.5 LF-MCG/0.5 injection Inject 0.5 mLs into the muscle once.  . [DISCONTINUED] zoster vaccine live, PF, (ZOSTAVAX) 69450 UNT/0.65ML injection Inject 19,400 Units into the skin once.

## 2013-06-22 NOTE — Assessment & Plan Note (Signed)
Bilateral pulm nodules in a pt who formerly lived in [place].  Doubt malignancy. NOdules are smaller compared to 01/2013 study Plan Repeat CT Chest one year No further immediate w/u needed

## 2013-06-22 NOTE — Patient Instructions (Signed)
A repeat CT Chest will be obtained in one year, we will call you to remind you Return as needed

## 2013-07-03 ENCOUNTER — Ambulatory Visit: Payer: Medicare Other | Admitting: Family Medicine

## 2013-07-06 ENCOUNTER — Ambulatory Visit (INDEPENDENT_AMBULATORY_CARE_PROVIDER_SITE_OTHER): Payer: Medicare Other | Admitting: Family Medicine

## 2013-07-06 ENCOUNTER — Encounter: Payer: Self-pay | Admitting: Family Medicine

## 2013-07-06 VITALS — BP 116/72 | HR 62 | Temp 98.3°F | Wt 183.0 lb

## 2013-07-06 DIAGNOSIS — M549 Dorsalgia, unspecified: Secondary | ICD-10-CM

## 2013-07-06 DIAGNOSIS — R300 Dysuria: Secondary | ICD-10-CM

## 2013-07-06 DIAGNOSIS — I10 Essential (primary) hypertension: Secondary | ICD-10-CM

## 2013-07-06 DIAGNOSIS — M542 Cervicalgia: Secondary | ICD-10-CM

## 2013-07-06 LAB — POCT URINALYSIS DIPSTICK
Bilirubin, UA: NEGATIVE
GLUCOSE UA: NEGATIVE
Ketones, UA: NEGATIVE
Nitrite, UA: NEGATIVE
Spec Grav, UA: 1.025
UROBILINOGEN UA: 0.2
pH, UA: 6

## 2013-07-06 MED ORDER — DILTIAZEM HCL ER COATED BEADS 240 MG PO CP24: 240.0000 mg | ORAL_CAPSULE | Freq: Every day | ORAL | Status: DC

## 2013-07-06 MED ORDER — ATENOLOL 50 MG PO TABS: 50.0000 mg | ORAL_TABLET | Freq: Two times a day (BID) | ORAL | Status: DC

## 2013-07-06 MED ORDER — CIPROFLOXACIN HCL 500 MG PO TABS: 500.0000 mg | ORAL_TABLET | Freq: Two times a day (BID) | ORAL | Status: AC

## 2013-07-06 MED ORDER — QUINAPRIL HCL 40 MG PO TABS: 40.0000 mg | ORAL_TABLET | Freq: Every day | ORAL | Status: DC

## 2013-07-06 MED ORDER — PRAVASTATIN SODIUM 20 MG PO TABS: 20.0000 mg | ORAL_TABLET | Freq: Every day | ORAL | Status: DC

## 2013-07-06 NOTE — Patient Instructions (Signed)
Urinary Tract Infection  Urinary tract infections (UTIs) can develop anywhere along your urinary tract. Your urinary tract is your body's drainage system for removing wastes and extra water. Your urinary tract includes two kidneys, two ureters, a bladder, and a urethra. Your kidneys are a pair of bean-shaped organs. Each kidney is about the size of your fist. They are located below your ribs, one on each side of your spine.  CAUSES  Infections are caused by microbes, which are microscopic organisms, including fungi, viruses, and bacteria. These organisms are so small that they can only be seen through a microscope. Bacteria are the microbes that most commonly cause UTIs.  SYMPTOMS   Symptoms of UTIs may vary by age and gender of the patient and by the location of the infection. Symptoms in young women typically include a frequent and intense urge to urinate and a painful, burning feeling in the bladder or urethra during urination. Older women and men are more likely to be tired, shaky, and weak and have muscle aches and abdominal pain. A fever may mean the infection is in your kidneys. Other symptoms of a kidney infection include pain in your back or sides below the ribs, nausea, and vomiting.  DIAGNOSIS  To diagnose a UTI, your caregiver will ask you about your symptoms. Your caregiver also will ask to provide a urine sample. The urine sample will be tested for bacteria and white blood cells. White blood cells are made by your body to help fight infection.  TREATMENT   Typically, UTIs can be treated with medication. Because most UTIs are caused by a bacterial infection, they usually can be treated with the use of antibiotics. The choice of antibiotic and length of treatment depend on your symptoms and the type of bacteria causing your infection.  HOME CARE INSTRUCTIONS   If you were prescribed antibiotics, take them exactly as your caregiver instructs you. Finish the medication even if you feel better after you  have only taken some of the medication.   Drink enough water and fluids to keep your urine clear or pale yellow.   Avoid caffeine, tea, and carbonated beverages. They tend to irritate your bladder.   Empty your bladder often. Avoid holding urine for long periods of time.   Empty your bladder before and after sexual intercourse.   After a bowel movement, women should cleanse from front to back. Use each tissue only once.  SEEK MEDICAL CARE IF:    You have back pain.   You develop a fever.   Your symptoms do not begin to resolve within 3 days.  SEEK IMMEDIATE MEDICAL CARE IF:    You have severe back pain or lower abdominal pain.   You develop chills.   You have nausea or vomiting.   You have continued burning or discomfort with urination.  MAKE SURE YOU:    Understand these instructions.   Will watch your condition.   Will get help right away if you are not doing well or get worse.  Document Released: 01/31/2005 Document Revised: 10/23/2011 Document Reviewed: 06/01/2011  [employer] Patient Information 2014 [employer].

## 2013-07-06 NOTE — Progress Notes (Signed)
  Subjective:    Amanda Joyce is a 73 y.o. female who complains of dysuria, left flank pain, frequency and suprapubic pressure. She has had symptoms for several weeks. Patient also complains of back pain. Patient denies congestion, cough, fever, headache, rhinitis, sorethroat, stomach ache and vaginal discharge. Patient does not have a history of recurrent UTI. Patient does not have a history of pyelonephritis. Pt also c/o R knee pain --no inury,  Knee feels puffy and hurts while standing and steps.  No popping/ crepitus.    The following portions of the patient's history were reviewed and updated as appropriate: allergies, current medications, past family history, past medical history, past social history, past surgical history and problem list.  Review of Systems Pertinent items are noted in HPI.    Objective:    BP 116/72  Pulse 62  Temp(Src) 98.3 F (36.8 C) (Oral)  Wt 183 lb (83.008 kg)  SpO2 95% General appearance: alert, cooperative, appears stated age and no distress Head: Normocephalic, without obvious abnormality, atraumatic Ears: normal TM's and external ear canals both ears Nose: Nares normal. Septum midline. Mucosa normal. No drainage or sinus tenderness. Throat: lips, mucosa, and tongue normal; teeth and gums normal Neck: no adenopathy, no carotid bruit, no JVD, supple, symmetrical, trachea midline and thyroid not enlarged, symmetric, no tenderness/mass/nodules Lungs: clear to auscultation bilaterally Heart: S1, S2 normal Extremities: extremities normal, atraumatic, no cyanosis or edema R knee--  Some swelling , no errythma or tenderness with pal  Laboratory:  Urine dipstick: sm for hemoglobin, 2+ for leukocyte esterase and trace for protein.   Micro exam: not done.    Assessment:    UTI     Plan:    Medications: ciprofloxacin. Maintain adequate hydration. Follow up if symptoms not improving, and as needed.   1. Dysuria  - POCT Urinalysis Dipstick - Urine  Culture - ciprofloxacin (CIPRO) 500 MG tablet; Take 1 tablet (500 mg total) by mouth 2 (two) times daily.  Dispense: 10 tablet; Refill: 0  2. Neck pain Not new-- pt would like new referral for salama - Ambulatory referral to Chiropractic  3. Back pain Not new-- f/u chiro - Ambulatory referral to Chiropractic  4. HTN (hypertension) stable - atenolol (TENORMIN) 50 MG tablet; Take 1 tablet (50 mg total) by mouth 2 (two) times daily.  Dispense: 180 tablet; Refill: 3 - quinapril (ACCUPRIL) 40 MG tablet; Take 1 tablet (40 mg total) by mouth daily.  Dispense: 90 tablet; Refill: 3

## 2013-07-06 NOTE — Progress Notes (Signed)
Pre visit review using our clinic review tool, if applicable. No additional management support is needed unless otherwise documented below in the visit note. 

## 2013-07-07 ENCOUNTER — Telehealth: Payer: Self-pay | Admitting: Family Medicine

## 2013-07-07 NOTE — Telephone Encounter (Signed)
Relevant patient education assigned to patient using Emmi. ° °

## 2013-07-08 LAB — URINE CULTURE

## 2013-07-09 NOTE — Progress Notes (Signed)
Spoke with patient and made aware that she does not have a UTI. Advised to call the office if sx did not get better.

## 2013-07-15 ENCOUNTER — Other Ambulatory Visit: Payer: Self-pay | Admitting: Family Medicine

## 2013-07-15 ENCOUNTER — Telehealth: Payer: Self-pay | Admitting: Family Medicine

## 2013-07-15 DIAGNOSIS — G8929 Other chronic pain: Secondary | ICD-10-CM

## 2013-07-15 NOTE — Telephone Encounter (Signed)
Patient called and requested a refill for oxyCODONE-acetaminophen (PERCOCET/ROXICET) 5-325 MG per tablet ° °

## 2013-07-16 MED ORDER — OXYCODONE-ACETAMINOPHEN 5-325 MG PO TABS: ORAL_TABLET | ORAL | Status: DC

## 2013-07-16 NOTE — Telephone Encounter (Signed)
Last seen 07/06/13 and filled 07/04/12 #60 with 1 refill. Please advise     KP

## 2013-07-16 NOTE — Telephone Encounter (Signed)
Msg left advising Rx ready for pick up.      KP 

## 2013-07-16 NOTE — Telephone Encounter (Signed)
Last seen 07/06/13 and 06/18/13 #90. Please advise     KP

## 2013-07-16 NOTE — Telephone Encounter (Signed)
Refill x1 

## 2013-07-17 ENCOUNTER — Telehealth: Payer: Self-pay | Admitting: *Deleted

## 2013-07-21 ENCOUNTER — Telehealth: Payer: Self-pay | Admitting: Family Medicine

## 2013-07-21 NOTE — Telephone Encounter (Signed)
She will need an appointment so we have proper documentation of the knee pain.     KP

## 2013-07-21 NOTE — Telephone Encounter (Signed)
Pt called back regarding ortho referral. Knee pain was referenced in last ov. Patient given phone numbers for [facility] and [facility].

## 2013-07-21 NOTE — Telephone Encounter (Signed)
Patient called and stated that she would like a referral to Ortho regarding her knee pain.

## 2013-08-03 ENCOUNTER — Ambulatory Visit: Payer: Medicare Other | Admitting: Infectious Disease

## 2013-08-04 ENCOUNTER — Telehealth: Payer: Self-pay | Admitting: *Deleted

## 2013-08-04 NOTE — Telephone Encounter (Signed)
Patient aware Rx will not be ready until 08/10/13. She voiced understanding. I advised I would call at that time.     KP

## 2013-08-04 NOTE — Telephone Encounter (Signed)
oxyCODONE-acetaminophen (PERCOCET/ROXICET) 5-325 MG per tablet Last refill: 07/16/2013 #90 Last OV: 07/06/2013 UDS up-to-date  Patient states she has about a week supply left

## 2013-08-04 NOTE — Telephone Encounter (Signed)
Refill x1 

## 2013-08-04 NOTE — Telephone Encounter (Signed)
Discussed with PCP and it is too soon to fill the Rx.      KP

## 2013-08-11 ENCOUNTER — Telehealth: Payer: Self-pay | Admitting: Family Medicine

## 2013-08-11 DIAGNOSIS — G8929 Other chronic pain: Secondary | ICD-10-CM

## 2013-08-11 MED ORDER — OXYCODONE-ACETAMINOPHEN 5-325 MG PO TABS: ORAL_TABLET | ORAL | Status: DC

## 2013-08-11 NOTE — Telephone Encounter (Signed)
Patient called to request a refill for oxyCODONE-acetaminophen (PERCOCET/ROXICET) 5-325 MG per tablet

## 2013-08-11 NOTE — Telephone Encounter (Signed)
Patient aware Rx ready for pick up.      KP 

## 2013-08-11 NOTE — Telephone Encounter (Signed)
Last seen 07/06/13 and 07/16/13 #90.  UDS 02/10/13 low risk   Please advise KP

## 2013-08-11 NOTE — Telephone Encounter (Signed)
Ok to refill x 1  

## 2013-08-12 NOTE — Telephone Encounter (Signed)
Called and informed patient that [place] has been received at our office from [employer]. Patient stated understanding. JG//CMA

## 2013-08-23 ENCOUNTER — Other Ambulatory Visit: Payer: Self-pay | Admitting: Family Medicine

## 2013-08-24 ENCOUNTER — Other Ambulatory Visit: Payer: Self-pay | Admitting: Family Medicine

## 2013-08-24 NOTE — Telephone Encounter (Signed)
Last seen 07/06/13 and filled 04/17/13 #30 UDS 02/10/13 Low risk   Please advise     KP

## 2013-08-26 ENCOUNTER — Telehealth: Payer: Self-pay

## 2013-08-26 NOTE — Telephone Encounter (Signed)
i can't find her contract with pharmacy

## 2013-08-26 NOTE — Telephone Encounter (Signed)
Patient walk-in requesting Ambien 5mg  #90 to [facility] on [address] UDS: 02/10/2013 Positive for ambien and percocet Last OV 07/16/2013 Last refill 03/31/2013  Patient has requested at [facility] twice. We do not have a record from [place] Please advise

## 2013-08-27 NOTE — Telephone Encounter (Signed)
Rx was requested on 4/19 via [place]. Rx was approved and faxed back on 4/20. I went ahead and re-faxed the documentation to the pharmacy.      KP

## 2013-08-28 NOTE — Telephone Encounter (Signed)
Call from [place] at [facility] and they wanted information on Her Ambien, when the Rx was first written and 2 treatments that have been tried and failed by the patient and the diagnosis. I gave the a start date of 01/13/09 on the 10 mg, Dx is insomnia and the Tamezepam was the initial medication, as well as the patient taking Xanax. She voiced understanding and said she would call back with the decision to approve the medication.        KP

## 2013-09-04 ENCOUNTER — Ambulatory Visit (INDEPENDENT_AMBULATORY_CARE_PROVIDER_SITE_OTHER): Payer: Medicare Other | Admitting: Family Medicine

## 2013-09-04 ENCOUNTER — Encounter: Payer: Self-pay | Admitting: Family Medicine

## 2013-09-04 VITALS — BP 116/74 | HR 57 | Temp 98.4°F | Wt 181.0 lb

## 2013-09-04 DIAGNOSIS — G8929 Other chronic pain: Secondary | ICD-10-CM

## 2013-09-04 MED ORDER — OXYCODONE-ACETAMINOPHEN 5-325 MG PO TABS: ORAL_TABLET | ORAL | Status: DC

## 2013-09-04 NOTE — Patient Instructions (Signed)

## 2013-09-04 NOTE — Progress Notes (Signed)
Pre visit review using our clinic review tool, if applicable. No additional management support is needed unless otherwise documented below in the visit note. 

## 2013-09-05 NOTE — Progress Notes (Signed)
   Subjective:    Patient ID: Amanda Joyce, female    DOB: 1940/09/11, 73 y.o.   MRN: [id number]  HPI Pt here for med refill. No complaints.   Review of Systems As above    Objective:   Physical Exam   BP 116/74  Pulse 57  Temp(Src) 98.4 F (36.9 C) (Oral)  Wt 181 lb (82.101 kg)  SpO2 97% General appearance: alert, cooperative, appears stated age and mild distress Nose: Nares normal. Septum midline. Mucosa normal. No drainage or sinus tenderness. Throat: lips, mucosa, and tongue normal; teeth and gums normal Neck: no adenopathy, no carotid bruit, no JVD, supple, symmetrical, trachea midline and thyroid not enlarged, symmetric, no tenderness/mass/nodules Lungs: clear to auscultation bilaterally Heart: regular rate and rhythm, S1, S2 normal, no murmur, click, rub or gallop Extremities: extremities normal, atraumatic, no cyanosis or edema      Assessment & Plan:  1. Chronic pain Refill meds - oxyCODONE-acetaminophen (PERCOCET/ROXICET) 5-325 MG per tablet; 1-2 tablets by mouth twice a day as needed  Dispense: 90 tablet; Refill: 0

## 2013-10-12 ENCOUNTER — Telehealth: Payer: Self-pay | Admitting: Family Medicine

## 2013-10-12 DIAGNOSIS — G8929 Other chronic pain: Secondary | ICD-10-CM

## 2013-10-12 MED ORDER — OXYCODONE-ACETAMINOPHEN 5-325 MG PO TABS: ORAL_TABLET | ORAL | Status: DC

## 2013-10-12 NOTE — Telephone Encounter (Signed)
Refill x1 

## 2013-10-12 NOTE — Telephone Encounter (Signed)
Caller name: Elynn Patteson  Relation to KD:[id number] Call back number: [phone] Pharmacy:  Reason for call: Patient called to request a refill for Oxycodone 90 day supply

## 2013-10-12 NOTE — Telephone Encounter (Signed)
Last seen and filled 09/04/13 #90. Please advise     KP

## 2013-10-12 NOTE — Telephone Encounter (Signed)
Patient aware Rx ready for pick up.      KP 

## 2013-10-16 ENCOUNTER — Telehealth: Payer: Self-pay | Admitting: *Deleted

## 2013-10-16 NOTE — Telephone Encounter (Signed)
Spoke with patient about rescheduling her appointment.  Confirmed new appointment for 01/12/14 at 3pm for labs and 01/19/14 at 4pm with Dr. Jana Hakim.

## 2013-10-19 ENCOUNTER — Other Ambulatory Visit: Payer: Self-pay | Admitting: Family Medicine

## 2013-11-09 ENCOUNTER — Telehealth: Payer: Self-pay | Admitting: Family Medicine

## 2013-11-09 NOTE — Telephone Encounter (Signed)
Caller name: Emelda Relation to pt: Call back number:[phone]   Reason for call:  Pt is wanting the RX ordered desvenlafaxine (PRISTIQ) 50 MG.  Pt states this is a free medication that we order.  Qty 90pills.

## 2013-11-09 NOTE — Telephone Encounter (Signed)
I had to call [facility] and get the patient's ID number because I can not order without it. That number is [id number].     KP

## 2013-11-10 NOTE — Telephone Encounter (Signed)
Medication has been completed order number [id number] it will take 7-10 business days to receive the med's.    KP

## 2013-11-17 ENCOUNTER — Telehealth: Payer: Self-pay | Admitting: Family Medicine

## 2013-11-17 DIAGNOSIS — G8929 Other chronic pain: Secondary | ICD-10-CM

## 2013-11-17 MED ORDER — OXYCODONE-ACETAMINOPHEN 5-325 MG PO TABS: ORAL_TABLET | ORAL | Status: DC

## 2013-11-17 NOTE — Telephone Encounter (Signed)
Caller name:Elina  Relation to ZY:[id number] Call back number: [phone] Pharmacy:  Reason for call: to request a refill for Oxycodone

## 2013-11-17 NOTE — Telephone Encounter (Signed)
Refill x1 

## 2013-11-17 NOTE — Telephone Encounter (Signed)
Patient aware Rx ready for pick up tomorrow.    KP 

## 2013-11-17 NOTE — Telephone Encounter (Signed)
Last seen 09/04/13 and filled 10/12/13 #90. UDS 02/10/13   Please advise     KP

## 2013-11-27 ENCOUNTER — Telehealth: Payer: Self-pay | Admitting: Oncology

## 2013-11-27 ENCOUNTER — Other Ambulatory Visit: Payer: Self-pay

## 2013-11-27 DIAGNOSIS — Z1231 Encounter for screening mammogram for malignant neoplasm of breast: Secondary | ICD-10-CM

## 2013-11-27 NOTE — Telephone Encounter (Signed)
pt cld wanting to know if appt [place] for mamma-adv no orders for that only chde appt-adv to call location where she normally has mamma and make appt prior to sch appt-[pt understood

## 2013-11-30 ENCOUNTER — Other Ambulatory Visit: Payer: Medicare Other

## 2013-12-07 ENCOUNTER — Ambulatory Visit: Payer: Medicare Other | Admitting: Oncology

## 2013-12-23 ENCOUNTER — Encounter: Payer: Self-pay | Admitting: Gastroenterology

## 2013-12-25 ENCOUNTER — Ambulatory Visit (INDEPENDENT_AMBULATORY_CARE_PROVIDER_SITE_OTHER): Payer: Medicare Other | Admitting: Family Medicine

## 2013-12-25 ENCOUNTER — Encounter: Payer: Self-pay | Admitting: Family Medicine

## 2013-12-25 VITALS — BP 120/78 | HR 67 | Temp 97.9°F | Wt 179.0 lb

## 2013-12-25 DIAGNOSIS — J4531 Mild persistent asthma with (acute) exacerbation: Secondary | ICD-10-CM

## 2013-12-25 DIAGNOSIS — M62838 Other muscle spasm: Secondary | ICD-10-CM

## 2013-12-25 DIAGNOSIS — G8929 Other chronic pain: Secondary | ICD-10-CM

## 2013-12-25 DIAGNOSIS — J45901 Unspecified asthma with (acute) exacerbation: Secondary | ICD-10-CM

## 2013-12-25 MED ORDER — METHOCARBAMOL 500 MG PO TABS: 500.0000 mg | ORAL_TABLET | Freq: Four times a day (QID) | ORAL | Status: DC

## 2013-12-25 MED ORDER — OXYCODONE-ACETAMINOPHEN 5-325 MG PO TABS: ORAL_TABLET | ORAL | Status: DC

## 2013-12-25 MED ORDER — IPRATROPIUM BROMIDE 0.06 % NA SOLN: 2.0000 | Freq: Three times a day (TID) | NASAL | Status: DC | PRN

## 2013-12-25 NOTE — Progress Notes (Signed)
   Subjective:    Patient ID: Amanda Joyce, female    DOB: 1940/10/27, 73 y.o.   MRN: [id number]  HPI  Pt here c/o muscle spasms L trap that is new.  She has been to chiropractor which helps but doesn't last.  She also needs med refill.    Review of Systems As above    Objective:   Physical Exam  BP 120/78  Pulse 67  Temp(Src) 97.9 F (36.6 C) (Oral)  Wt 179 lb (81.194 kg)  SpO2 97% General appearance: alert, cooperative, appears stated age and no distress Neck: no adenopathy, no carotid bruit, no JVD, supple, symmetrical, trachea midline and thyroid not enlarged, symmetric, no tenderness/mass/nodules--muscle spasm--trap on Left Lungs: clear to auscultation bilaterally Heart: S1, S2 normal       Assessment & Plan:  1. Chronic pain   - oxyCODONE-acetaminophen (PERCOCET/ROXICET) 5-325 MG per tablet; 1-2 tablets by mouth twice a day as needed  Dispense: 90 tablet; Refill: 0  2. Asthma with acute exacerbation, mild persistent   - ipratropium (ATROVENT) 0.06 % nasal spray; Place 2 sprays into the nose 3 (three) times daily as needed.  Dispense: 15 mL; Refill: 5  3. Muscle spasm   - methocarbamol (ROBAXIN) 500 MG tablet; Take 1 tablet (500 mg total) by mouth 4 (four) times daily.  Dispense: 30 tablet; Refill: 1

## 2013-12-25 NOTE — Progress Notes (Signed)
Pre visit review using our clinic review tool, if applicable. No additional management support is needed unless otherwise documented below in the visit note. 

## 2013-12-25 NOTE — Patient Instructions (Signed)

## 2014-01-12 ENCOUNTER — Ambulatory Visit
Admission: RE | Admit: 2014-01-12 | Discharge: 2014-01-12 | Disposition: A | Discharge status: Home / Self Care | Payer: Medicare Other | Source: Ambulatory Visit

## 2014-01-12 ENCOUNTER — Other Ambulatory Visit (HOSPITAL_BASED_OUTPATIENT_CLINIC_OR_DEPARTMENT_OTHER): Payer: Medicare Other

## 2014-01-12 ENCOUNTER — Other Ambulatory Visit: Payer: Self-pay | Admitting: *Deleted

## 2014-01-12 DIAGNOSIS — C50919 Malignant neoplasm of unspecified site of unspecified female breast: Secondary | ICD-10-CM

## 2014-01-12 DIAGNOSIS — Z1231 Encounter for screening mammogram for malignant neoplasm of breast: Secondary | ICD-10-CM

## 2014-01-12 LAB — CBC WITH DIFFERENTIAL/PLATELET
BASO%: 1.1 % (ref 0.0–2.0)
Basophils Absolute: 0.1 10*3/uL (ref 0.0–0.1)
EOS%: 3.1 % (ref 0.0–7.0)
Eosinophils Absolute: 0.2 10*3/uL (ref 0.0–0.5)
HCT: 42.7 % (ref 34.8–46.6)
HGB: 13.9 g/dL (ref 11.6–15.9)
LYMPH%: 26.7 % (ref 14.0–49.7)
MCH: 29.1 pg (ref 25.1–34.0)
MCHC: 32.6 g/dL (ref 31.5–36.0)
MCV: 89.3 fL (ref 79.5–101.0)
MONO#: 0.7 10*3/uL (ref 0.1–0.9)
MONO%: 9.1 % (ref 0.0–14.0)
NEUT#: 4.3 10*3/uL (ref 1.5–6.5)
NEUT%: 60 % (ref 38.4–76.8)
Platelets: 305 10*3/uL (ref 145–400)
RBC: 4.78 10*6/uL (ref 3.70–5.45)
RDW: 12.9 % (ref 11.2–14.5)
WBC: 7.2 10*3/uL (ref 3.9–10.3)
lymph#: 1.9 10*3/uL (ref 0.9–3.3)

## 2014-01-12 LAB — COMPREHENSIVE METABOLIC PANEL (CC13)
ALT: 9 U/L (ref 0–55)
AST: 17 U/L (ref 5–34)
Albumin: 4 g/dL (ref 3.5–5.0)
Alkaline Phosphatase: 58 U/L (ref 40–150)
Anion Gap: 8 mEq/L (ref 3–11)
BILIRUBIN TOTAL: 0.52 mg/dL (ref 0.20–1.20)
BUN: 15 mg/dL (ref 7.0–26.0)
CO2: 29 mEq/L (ref 22–29)
CREATININE: 0.9 mg/dL (ref 0.6–1.1)
Calcium: 10.5 mg/dL — ABNORMAL HIGH (ref 8.4–10.4)
Chloride: 104 mEq/L (ref 98–109)
GLUCOSE: 105 mg/dL (ref 70–140)
Potassium: 4.5 mEq/L (ref 3.5–5.1)
SODIUM: 141 meq/L (ref 136–145)
Total Protein: 7.2 g/dL (ref 6.4–8.3)

## 2014-01-19 ENCOUNTER — Telehealth: Payer: Self-pay | Admitting: Oncology

## 2014-01-19 ENCOUNTER — Ambulatory Visit (HOSPITAL_BASED_OUTPATIENT_CLINIC_OR_DEPARTMENT_OTHER): Payer: Medicare Other | Admitting: Oncology

## 2014-01-19 VITALS — BP 176/80 | HR 66 | Temp 97.7°F | Resp 18 | Ht 68.0 in | Wt 179.0 lb

## 2014-01-19 DIAGNOSIS — C50912 Malignant neoplasm of unspecified site of left female breast: Secondary | ICD-10-CM | POA: Insufficient documentation

## 2014-01-19 DIAGNOSIS — Z853 Personal history of malignant neoplasm of breast: Secondary | ICD-10-CM

## 2014-01-19 NOTE — Progress Notes (Signed)
ID: [name]   DOB: 1940-10-27  MR#: [id number]  UXL#:[id number]  PCP: Garnet Koyanagi, DO GYN: SU:  OTHER MD:   HISTORY OF PRESENT ILLNESS: From the original intake note: Amanda Joyce palpated a lump in her left breast in early 2006.  This was evaluated with mammography which showed indeed a suspicious lesion (I do not have any radiology records) and lead to biopsy of the left breast mass on May 23, 2004.    The pathology report (Excision No. [id number] at [facility] in [place], [place]) showed an infiltrating ductal carcinoma, grade 2, with a small percentage in situ component.  No evidence of lymphovascular invasion.  Strongly positive for ER and PR, the Hercept testing being 1+ and the HER-2 by FISH being in the equivocal range with a ratio of 1.9.    With this information, the patient proceeded to definitive surgical removal of the tumor under Dr. Sinclair Ship on June 15, 2004.  This was a left lumpectomy and axillary lymph node dissection.  The final pathology ([id number]) showed a 4 cm infiltrating ductal carcinoma, grade 2, with probable lymphatic invasion, but more importantly, with 3 out of 16 lymph nodes involved, positive for metastatic carcinoma, including focal extracapsular extension.  There was a positive margin (inferior) and this was cleared by further surgery on June 26, 2004 ([id number]).  The patient was then staged with no evidence of metastatic disease and was treated with dose-dense Cytoxan and Adriamycin followed by dose-dense Taxol, all by standard protocol to the best of my ability to tell from the records and from the patient.  It was followed by radiation (I do not have the radiation records) completed in October of 2006.  Her subsequent history is as detailed below    INTERVAL HISTORY: Charmin returns for followup of her breast cancer. The interval history is stable. 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 She says some people regularly make a meal of it. She continues to live with her son. They recently lost a long time PET.  REVIEW OF SYSTEMS: She remains anxious and stressed. She gets headaches that begin in the nuchal area and workup. Distress his not so much related to work or any home situation it is just baseline for her. She does have pain in her back and joints, but this is not more intense or frequent than before. She is having hot flashes. She is having significant pain in her right knee which is "bone on bone" she says. She is getting steroid shots under Dr. Percell Miller which are helping. A detailed review of systems today was otherwise stable  PAST MEDICAL HISTORY: Past Medical History  Diagnosis Date  . Arthritis   . Hypertension   . Depression   . Gout   . Temporomandibular joint disorders, unspecified   . Personal history of malignant neoplasm of breast   . Breast cancer     Left   . Osteoarthrosis, unspecified whether generalized or localized, unspecified site   . Irritable bowel syndrome   . Nontoxic uninodular goiter   . Hyperlipemia   . Hiatal hernia   . GERD (gastroesophageal reflux disease)   Significant in addition to the above for the patient being status post right thyroidectomy at age 74.  She also underwent ultrasound-guided thyroid nodule fine-needle aspiration on Sep 13, 2004.  I do not have the pathology report from that, but according to the patient, everything was benign. Other medical problems include status  post appendectomy, status post right salpingo-oophorectomy in the patient's mid-twenties for an ovarian cyst, status post left herniorrhaphy, history of irritable bowel syndrome and a history of hypertension.    PAST SURGICAL HISTORY: Past Surgical History  Procedure Laterality Date  . Appendectomy    . Hernia repair    . Abdominal hysterectomy    . Thyroid surgery    . Breast lumpectomy      Left breast X2    FAMILY HISTORY Family History   Problem Relation Age of Onset  . Coronary artery disease Father   . Liver cancer Paternal Uncle   . Alcohol abuse Paternal Uncle   . Colon cancer Neg Hx   . Other Mother     deceased from brain tumor  The patient's father died from a myocardial infarction at the age of 50.  The patient's mother died from "brain cancer" when the patient was 73 years old. She is an only child.  GYNECOLOGIC HISTORY: She is GX, P2.  First pregnancy to term at age 65.  Last menstrual period more than 15 years ago. She took hormones all of those 15 years until her recent diagnosis of breast cancer.    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  She has been "happily divorced" x 2.  Her son, Amanda Joyce, lives in [place].  He buys and sells cars.  The patient moved to this area to live with him.  Her daughter, Amanda Joyce, lives with her husband, Tanda Rockers, in [place], where the patient's 4 granddaughters reside.  Amanda Joyce has no children.  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     ADVANCED DIRECTIVES: Not in play  HEALTH MAINTENANCE: History  Substance Use Topics  . Smoking status: Never Smoker   . Smokeless tobacco: Never Used  . Alcohol Use: No     Colonoscopy:  PAP:  Bone density:  Lipid panel:  Allergies  Allergen Reactions  . Clarithromycin     REACTION: NIGHTMARES  . Influenza Virus Vaccine Split Nausea And Vomiting  . Penicillins     REACTION: ITCHING    Current Outpatient Prescriptions  Medication Sig Dispense Refill  . ALPRAZolam (XANAX) 0.25 MG tablet Take 1 tablet (0.25 mg total) by mouth 3 (three) times daily as needed for anxiety.  30 tablet  0  . aspirin 81 MG tablet Take 81 mg by mouth daily.      Marland Kitchen atenolol (TENORMIN) 50 MG tablet Take 1 tablet (50 mg total) by mouth 2 (two) times daily.   180 tablet  3  . Azelastine-Fluticasone 137-50 MCG/ACT SUSP Place 1 spray into the nose 2 (two) times daily as needed.      . Cholecalciferol (VITAMIN D3) 2000 UNITS capsule Take 4,000 Units by mouth daily.      Marland Kitchen desvenlafaxine (PRISTIQ) 50 MG 24 hr tablet Take 50 mg by mouth daily.      Marland Kitchen diltiazem (CARDIZEM CD) 240 MG 24 hr capsule Take 1 capsule (240 mg total) by mouth daily.  90 capsule  3  . ibuprofen (ADVIL,MOTRIN) 800 MG tablet TAKE ONE TABLET BY MOUTH EVERY 6 HOURS AS NEEDED  60 tablet  0  . ipratropium (ATROVENT) 0.06 % nasal spray Place 2 sprays into the nose 3 (three) times daily as needed.  15 mL  5  .  methocarbamol (ROBAXIN) 500 MG tablet Take 1 tablet (500 mg total) by mouth 4 (four) times daily.  30 tablet  1  . oxyCODONE-acetaminophen (PERCOCET/ROXICET) 5-325 MG per tablet 1-2 tablets by mouth twice a day as needed  90 tablet  0  . pravastatin (PRAVACHOL) 20 MG tablet Take 1 tablet (20 mg total) by mouth daily.  90 tablet  3  . quinapril (ACCUPRIL) 40 MG tablet Take 1 tablet (40 mg total) by mouth daily.  90 tablet  3  . triamcinolone cream (KENALOG) 0.1 % Apply 1 application topically 2 (two) times daily.  30 g  0  . zolpidem (AMBIEN) 5 MG tablet TAKE ONE TABLET BY MOUTH AT BEDTIME AS NEEDED FOR SLEEP  90 tablet  0   No current facility-administered medications for this visit.    OBJECTIVE: Middle-aged white woman in no acute distress Filed Vitals:   01/19/14 1547  BP: 176/80  Pulse: 66  Temp: 97.7 F (36.5 C)  Resp: 18     Body mass index is 27.22 kg/(m^2).    ECOG FS: 1  Sclerae unicteric, EOMs intact Oropharynx clear, dentition in fair repair No cervical or supraclavicular adenopathy Lungs no rales or rhonchi Heart regular rate and rhythm Abd soft nontender, positive bowel sounds MSK no focal spinal tenderness, no peripheral edema Neuro: nonfocal, well oriented, anxious affect Breasts: The right breast is unremarkable. The left breast is status post lumpectomy  and radiation. There is no evidence of local recurrence. The left axilla is benign  LAB RESULTS: Lab Results  Component Value Date   WBC 7.2 01/12/2014   NEUTROABS 4.3 01/12/2014   HGB 13.9 01/12/2014   HCT 42.7 01/12/2014   MCV 89.3 01/12/2014   PLT 305 01/12/2014      Chemistry      Component Value Date/Time   NA 141 01/12/2014 1403   NA 141 06/18/2013 1314   K 4.5 01/12/2014 1403   K 4.1 06/18/2013 1314   CL 101 06/18/2013 1314   CO2 29 01/12/2014 1403   CO2 29 02/10/2013 1028   BUN 15.0 01/12/2014 1403   BUN 15 06/18/2013 1314   CREATININE 0.9 01/12/2014 1403   CREATININE 0.80 06/18/2013 1314   CREATININE 0.59 01/09/2013 1625      Component Value Date/Time   CALCIUM 10.5* 01/12/2014 1403   CALCIUM 9.5 02/10/2013 1028   ALKPHOS 58 01/12/2014 1403   ALKPHOS 49 02/10/2013 1028   AST 17 01/12/2014 1403   AST 16 02/10/2013 1028   ALT 9 01/12/2014 1403   ALT 7 02/10/2013 1028   BILITOT 0.52 01/12/2014 1403   BILITOT 0.7 02/10/2013 1028       Lab Results  Component Value Date   LABCA2 24 12/06/2010    No components found with this basename: [id number]    No results found for this basename: INR,  in the last 168 hours  Urinalysis    Component Value Date/Time   COLORURINE YELLOW 01/09/2013 [place] 01/09/2013 1754   LABSPEC 1.022 01/09/2013 1754   LABSPEC 1.020 12/24/2005 1645   PHURINE 7.0 01/09/2013 1754   GLUCOSEU NEGATIVE 01/09/2013 1754   HGBUR SMALL* 01/09/2013 1754   HGBUR negative 12/20/2009 1506   BILIRUBINUR Neg 07/06/2013 [place] 01/09/2013 1754   KETONESUR 15* 01/09/2013 1754   PROTEINUR Trace 07/06/2013 1457   PROTEINUR >300* 01/09/2013 1754   UROBILINOGEN 0.2 07/06/2013 1457   UROBILINOGEN 0.2 01/09/2013 1754   NITRITE Neg 07/06/2013 1457  NITRITE NEGATIVE 01/09/2013 1754   LEUKOCYTESUR moderate (2+) 07/06/2013 1457    STUDIES: Mm Digital Screening Bilateral  01/12/2014   CLINICAL DATA:  Screening.  EXAM: DIGITAL SCREENING BILATERAL MAMMOGRAM WITH CAD  COMPARISON:  Previous  exam(s).  ACR Breast Density Category b: There are scattered areas of fibroglandular density.  FINDINGS: There are no findings suspicious for malignancy. Images were processed with CAD.  IMPRESSION: No mammographic evidence of malignancy. A result letter of this screening mammogram will be mailed directly to the patient.  RECOMMENDATION: Screening mammogram in one year. (Code:SM-B-01Y)  BI-RADS CATEGORY  1: Negative.   Electronically Signed   By: Luberta Robertson M.D.   On: 01/12/2014 15:04    ASSESSMENT: 73 y.o. 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  (3)  She was on Arimidex between September 2006 and June 2009 at which time she was switched to tamoxifen, completed June 2012.    PLAN:  Leiliana is doing well as far as her breast cancer is concerned. I think she would benefit from more exercise in terms of mood and stress control. The right knee is a problem but she does have a stationary bike at home that she could use. I reassured her this would not hurt her knee.  Otherwise I reassured her about her mammogram and labs were fine. She will see me again in one year. If we have a settled survivorship clinic at that time she would be an excellent candidate for a period she will call with any problems that may develop before the next visit.  MAGRINAT,GUSTAV C    01/19/2014

## 2014-01-21 ENCOUNTER — Ambulatory Visit: Payer: Medicare Other | Admitting: Family Medicine

## 2014-01-22 ENCOUNTER — Telehealth: Payer: Self-pay | Admitting: Family Medicine

## 2014-01-22 DIAGNOSIS — G8929 Other chronic pain: Secondary | ICD-10-CM

## 2014-01-22 NOTE — Telephone Encounter (Signed)
Last seen and filled 12/25/13 #90   Please advise     KP

## 2014-01-22 NOTE — Telephone Encounter (Signed)
Refill x1 

## 2014-01-22 NOTE — Telephone Encounter (Signed)
Caller name: Aryahna Relation to pt: Call back number: [phone]   Reason for call:  Pt is needing a refill on rx oxyCODONE-acetaminophen (PERCOCET/ROXICET) 5-325 MG

## 2014-01-25 MED ORDER — OXYCODONE-ACETAMINOPHEN 5-325 MG PO TABS: ORAL_TABLET | ORAL | Status: DC

## 2014-01-25 NOTE — Telephone Encounter (Signed)
Patient aware Rx ready for pick up.      KP 

## 2014-01-25 NOTE — Telephone Encounter (Signed)
Patient calling back regarding this. I told her that we would call her when ready for pickup

## 2014-02-04 ENCOUNTER — Ambulatory Visit (INDEPENDENT_AMBULATORY_CARE_PROVIDER_SITE_OTHER): Payer: Medicare Other | Admitting: Family Medicine

## 2014-02-04 ENCOUNTER — Ambulatory Visit (HOSPITAL_BASED_OUTPATIENT_CLINIC_OR_DEPARTMENT_OTHER)
Admission: RE | Admit: 2014-02-04 | Discharge: 2014-02-04 | Disposition: A | Discharge status: Home / Self Care | Payer: Medicare Other | Source: Ambulatory Visit | Attending: Family Medicine | Admitting: Family Medicine

## 2014-02-04 ENCOUNTER — Encounter: Payer: Self-pay | Admitting: Family Medicine

## 2014-02-04 VITALS — BP 160/69 | HR 60 | Temp 98.2°F | Wt 178.6 lb

## 2014-02-04 DIAGNOSIS — R0789 Other chest pain: Secondary | ICD-10-CM

## 2014-02-04 DIAGNOSIS — M62838 Other muscle spasm: Secondary | ICD-10-CM

## 2014-02-04 MED ORDER — TIZANIDINE HCL 4 MG PO TABS: 4.0000 mg | ORAL_TABLET | Freq: Four times a day (QID) | ORAL | Status: DC | PRN

## 2014-02-04 NOTE — Patient Instructions (Signed)

## 2014-02-04 NOTE — Progress Notes (Signed)
Pre visit review using our clinic review tool, if applicable. No additional management support is needed unless otherwise documented below in the visit note. 

## 2014-02-04 NOTE — Progress Notes (Signed)
   Subjective:    Patient ID: GWEN SARVIS, female    DOB: [birth year], 73 y.o.   MRN: [id number]  HPI Pt is here c/o muscle spasms in neck and chest and trapezius L side and she is complaining of L clavicle enlargement since chemo.     Review of Systems As above    Objective:   Physical Exam BP 160/69  Pulse 60  Temp(Src) 98.2 F (36.8 C) (Oral)  Wt 178 lb 9.2 oz (81 kg)  SpO2 99% Lungs: clear to auscultation bilaterally Heart: S1, S2 normal Extremities: extremities normal, atraumatic, no cyanosis or edema Musculoskeletal--- + muscle spasms L neck, trapezius , mid back and shoulder blade                            + l clavicle larger than R -- no pain on palpation       Assessment & Plan:  1. Other chest pain Muscular  - DG Chest 2 View; Future  2. Muscle spasm Change muscle relaxer, f/u chiropractor - tiZANidine (ZANAFLEX) 4 MG tablet; Take 1 tablet (4 mg total) by mouth every 6 (six) hours as needed for muscle spasms.  Dispense: 30 tablet; Refill: 0

## 2014-02-18 ENCOUNTER — Encounter: Payer: Self-pay | Admitting: *Deleted

## 2014-02-22 ENCOUNTER — Telehealth: Payer: Self-pay | Admitting: Family Medicine

## 2014-02-22 DIAGNOSIS — G8929 Other chronic pain: Secondary | ICD-10-CM

## 2014-02-22 MED ORDER — OXYCODONE-ACETAMINOPHEN 5-325 MG PO TABS: ORAL_TABLET | ORAL | Status: DC

## 2014-02-22 NOTE — Telephone Encounter (Signed)
Caller name: Zoiey, Christy Relation to pt: self  Call back number: [phone]  Reason for call:    Pt requesting a refill oxyCODONE-acetaminophen (PERCOCET/ROXICET) 5-325 MG per tablet and desvenlafaxine (PRISTIQ) 50 MG 24 hr tablet

## 2014-02-22 NOTE — Telephone Encounter (Signed)
oxycodone routinely RF by PCP, will Rx #90 Unable to do pristiq, I don't see that PCP has been prescribing

## 2014-02-22 NOTE — Telephone Encounter (Signed)
Vm left advising Oxycodone is ready for pick up.     KP

## 2014-02-22 NOTE — Telephone Encounter (Signed)
Patient was last seen 02/04/14 and filled 01/25/14 #90 UDS 02/10/13 Low risk  Please advise      KP

## 2014-02-24 ENCOUNTER — Telehealth: Payer: Self-pay | Admitting: Family Medicine

## 2014-02-24 NOTE — Telephone Encounter (Addendum)
Called [employer] to update address and information so the medication will be delivered here. Confirmation # [id number].     KP

## 2014-02-24 NOTE — Telephone Encounter (Signed)
Caller name: Omie Relation to pt: Call back number:[phone] Pharmacy: [facility] on [address]  Reason for call: pt needs refill on Rx ibuprofen (ADVIL,MOTRIN) 800 MG tablet ;  Pharmacy states they have tried to reach [place] many times.

## 2014-02-25 MED ORDER — IBUPROFEN 800 MG PO TABS: ORAL_TABLET | ORAL | Status: DC

## 2014-03-26 ENCOUNTER — Telehealth: Payer: Self-pay | Admitting: Family Medicine

## 2014-03-26 DIAGNOSIS — G8929 Other chronic pain: Secondary | ICD-10-CM

## 2014-03-26 MED ORDER — OXYCODONE-ACETAMINOPHEN 5-325 MG PO TABS: ORAL_TABLET | ORAL | Status: DC

## 2014-03-26 NOTE — Telephone Encounter (Signed)
Patient was last seen 02/04/14 and filled 02/22/14 #90 UDS 02/10/13 Low risk   PLEASE ADVISE     KP

## 2014-03-26 NOTE — Telephone Encounter (Signed)
Refill x1 

## 2014-03-26 NOTE — Telephone Encounter (Signed)
Patient aware Rx ready for pick up.      KP 

## 2014-03-26 NOTE — Telephone Encounter (Signed)
Caller name: destiney Relation to pt: self Call back number: [phone] Pharmacy:  Reason for call:   Patient is requesting a new oxycodone rx

## 2014-03-30 ENCOUNTER — Encounter: Payer: Self-pay | Admitting: Physician Assistant

## 2014-03-30 ENCOUNTER — Ambulatory Visit (INDEPENDENT_AMBULATORY_CARE_PROVIDER_SITE_OTHER): Payer: Medicare Other | Admitting: Physician Assistant

## 2014-03-30 VITALS — BP 148/60 | HR 64 | Temp 98.0°F | Wt 179.6 lb

## 2014-03-30 DIAGNOSIS — J208 Acute bronchitis due to other specified organisms: Secondary | ICD-10-CM | POA: Insufficient documentation

## 2014-03-30 MED ORDER — HYDROCOD POLST-CHLORPHEN POLST 10-8 MG/5ML PO LQCR: 5.0000 mL | Freq: Two times a day (BID) | ORAL | Status: DC | PRN

## 2014-03-30 MED ORDER — AZITHROMYCIN 250 MG PO TABS: ORAL_TABLET | ORAL | Status: DC

## 2014-03-30 NOTE — Progress Notes (Signed)
Patient presents to clinic today c/o chest congestion, productive cough and fatigue over the past week, worsening over the past couple of days.  Denies fever, chills, aches, sinus pressure/pain.  Denies recent travel or sick contact.  Patient is a non-smoker.  Denies asthma but dopes endorse history of seaoonal allergies, most prominent in the spring.  Past Medical History  Diagnosis Date  . Arthritis   . Hypertension   . Depression   . Gout   . Temporomandibular joint disorders, unspecified   . Personal history of malignant neoplasm of breast   . Breast cancer     Left   . Osteoarthrosis, unspecified whether generalized or localized, unspecified site   . Irritable bowel syndrome   . Nontoxic uninodular goiter   . Hyperlipemia   . Hiatal hernia   . GERD (gastroesophageal reflux disease)     Current Outpatient Prescriptions on File Prior to Visit  Medication Sig Dispense Refill  . ALPRAZolam (XANAX) 0.25 MG tablet Take 1 tablet (0.25 mg total) by mouth 3 (three) times daily as needed for anxiety. 30 tablet 0  . aspirin 81 MG tablet Take 81 mg by mouth daily.    Marland Kitchen atenolol (TENORMIN) 50 MG tablet Take 1 tablet (50 mg total) by mouth 2 (two) times daily. 180 tablet 3  . Azelastine-Fluticasone 137-50 MCG/ACT SUSP Place 1 spray into the nose 2 (two) times daily as needed.    . Cholecalciferol (VITAMIN D3) 2000 UNITS capsule Take 4,000 Units by mouth daily.    Marland Kitchen desvenlafaxine (PRISTIQ) 50 MG 24 hr tablet Take 50 mg by mouth daily.    Marland Kitchen diltiazem (CARDIZEM CD) 240 MG 24 hr capsule Take 1 capsule (240 mg total) by mouth daily. 90 capsule 3  . ibuprofen (ADVIL,MOTRIN) 800 MG tablet TAKE ONE TABLET BY MOUTH EVERY 6 HOURS AS NEEDED 60 tablet 2  . ipratropium (ATROVENT) 0.06 % nasal spray Place 2 sprays into the nose 3 (three) times daily as needed. 15 mL 5  . methocarbamol (ROBAXIN) 500 MG tablet Take 1 tablet (500 mg total) by mouth 4 (four) times daily. 30 tablet 1  .  oxyCODONE-acetaminophen (PERCOCET/ROXICET) 5-325 MG per tablet 1-2 tablets by mouth twice a day as needed 90 tablet 0  . pravastatin (PRAVACHOL) 20 MG tablet Take 1 tablet (20 mg total) by mouth daily. 90 tablet 3  . quinapril (ACCUPRIL) 40 MG tablet Take 1 tablet (40 mg total) by mouth daily. 90 tablet 3  . tiZANidine (ZANAFLEX) 4 MG tablet Take 1 tablet (4 mg total) by mouth every 6 (six) hours as needed for muscle spasms. 30 tablet 0  . triamcinolone cream (KENALOG) 0.1 % Apply 1 application topically 2 (two) times daily. 30 g 0  . zolpidem (AMBIEN) 5 MG tablet TAKE ONE TABLET BY MOUTH AT BEDTIME AS NEEDED FOR SLEEP 90 tablet 0   No current facility-administered medications on file prior to visit.    Allergies  Allergen Reactions  . Clarithromycin     REACTION: NIGHTMARES  . Influenza Virus Vaccine Split Nausea And Vomiting  . Penicillins     REACTION: ITCHING    Family History  Problem Relation Age of Onset  . Coronary artery disease Father   . Liver cancer Paternal Uncle   . Alcohol abuse Paternal Uncle   . Colon cancer Neg Hx   . Other Mother     deceased from brain tumor    History   Social History  . Marital Status: Single  Spouse Name: N/A    Number of Children: 2  . Years of Education: N/A   Occupational History  . Retired     Teacher, early years/pre  .     Social History Main Topics  . Smoking status: Never Smoker   . Smokeless tobacco: Never Used  . Alcohol Use: No  . Drug Use: No  . Sexual Activity: None   Other Topics Concern  . None   Social History Narrative   2 caffeine drinks daily    Review of Systems - See HPI.  All other ROS are negative.  BP 148/60 mmHg  Pulse 64  Temp(Src) 98 F (36.7 C) (Oral)  Wt 179 lb 9.6 oz (81.466 kg)  SpO2 96%  Physical Exam  Constitutional: She is oriented to person, place, and time and well-developed, well-nourished, and in no distress.  HENT:  Head: Normocephalic and atraumatic.  Right Ear: External ear  normal.  Left Ear: External ear normal.  Nose: Nose normal.  Mouth/Throat: Oropharynx is clear and moist. No oropharyngeal exudate.  TM within normal limits bilaterally.  Eyes: Conjunctivae are normal. Pupils are equal, round, and reactive to light.  Neck: Neck supple.  Cardiovascular: Normal rate, regular rhythm, normal heart sounds and intact distal pulses.   Pulmonary/Chest: Effort normal and breath sounds normal. No respiratory distress. She has no wheezes. She has no rales. She exhibits no tenderness.  Lymphadenopathy:    She has no cervical adenopathy.  Neurological: She is alert and oriented to person, place, and time.  Skin: Skin is warm and dry. No rash noted.  Psychiatric: Affect normal.  Vitals reviewed.   Recent Results (from the past 2160 hour(s))  CBC with Differential     Status: None   Collection Time: 01/12/14  2:03 PM  Result Value Ref Range   WBC 7.2 3.9 - 10.3 10e3/uL   NEUT# 4.3 1.5 - 6.5 10e3/uL   HGB 13.9 11.6 - 15.9 g/dL   HCT 42.7 34.8 - 46.6 %   Platelets 305 145 - 400 10e3/uL   MCV 89.3 79.5 - 101.0 fL   MCH 29.1 25.1 - 34.0 pg   MCHC 32.6 31.5 - 36.0 g/dL   RBC 4.78 3.70 - 5.45 10e6/uL   RDW 12.9 11.2 - 14.5 %   lymph# 1.9 0.9 - 3.3 10e3/uL   MONO# 0.7 0.1 - 0.9 10e3/uL   Eosinophils Absolute 0.2 0.0 - 0.5 10e3/uL   Basophils Absolute 0.1 0.0 - 0.1 10e3/uL   NEUT% 60.0 38.4 - 76.8 %   LYMPH% 26.7 14.0 - 49.7 %   MONO% 9.1 0.0 - 14.0 %   EOS% 3.1 0.0 - 7.0 %   BASO% 1.1 0.0 - 2.0 %  Comprehensive metabolic panel (Cmet) - [facility]     Status: Abnormal   Collection Time: 01/12/14  2:03 PM  Result Value Ref Range   Sodium 141 136 - 145 mEq/L   Potassium 4.5 3.5 - 5.1 mEq/L   Chloride 104 98 - 109 mEq/L   CO2 29 22 - 29 mEq/L   Glucose 105 70 - 140 mg/dl   BUN 15.0 7.0 - 26.0 mg/dL   Creatinine 0.9 0.6 - 1.1 mg/dL   Total Bilirubin 0.52 0.20 - 1.20 mg/dL   Alkaline Phosphatase 58 40 - 150 U/L   AST 17 5 - 34 U/L   ALT 9 0 - 55 U/L   Total  Protein 7.2 6.4 - 8.3 g/dL   Albumin 4.0 3.5 - 5.0 g/dL  Calcium 10.5 (H) 8.4 - 10.4 mg/dL   Anion Gap 8 3 - 11 mEq/L    Assessment/Plan: Viral bronchitis Increase fluids.  Rest.  Saline nasal spray.  Rx tussionex for cough. Mucinex.  Rx for Azithromycin printed and given patient, only to be used if symptoms worsen over the next 3 days.

## 2014-03-30 NOTE — Progress Notes (Signed)
Pre visit review using our clinic review tool, if applicable. No additional management support is needed unless otherwise documented below in the visit note. 

## 2014-03-30 NOTE — Assessment & Plan Note (Signed)
Increase fluids.  Rest.  Saline nasal spray.  Rx tussionex for cough. Mucinex.  Rx for Azithromycin printed and given patient, only to be used if symptoms worsen over the next 3 days.

## 2014-03-30 NOTE — Patient Instructions (Addendum)
Increase your fluid intake.  Get plenty of rest.  Take a daily probiotic.  Continue medications as directed.  Plain Mucinex will also be beneficial. I have given you a cough syrup to take as directed.  Your symptoms seem viral overall.  If symptoms are not improving over the next 2-3 days, please fill the Z-pack and take as directed.

## 2014-04-23 ENCOUNTER — Telehealth: Payer: Self-pay | Admitting: Family Medicine

## 2014-04-23 DIAGNOSIS — G8929 Other chronic pain: Secondary | ICD-10-CM

## 2014-04-23 MED ORDER — OXYCODONE-ACETAMINOPHEN 5-325 MG PO TABS: ORAL_TABLET | ORAL | Status: DC

## 2014-04-23 NOTE — Telephone Encounter (Signed)
Msg left advising Rx ready for pick up.      KP

## 2014-04-23 NOTE — Telephone Encounter (Signed)
Pt is requesting a refill of oxyCODONE-acetaminophen (PERCOCET/ROXICET) 5-325 MG per tablet.

## 2014-05-24 ENCOUNTER — Telehealth: Payer: Self-pay | Admitting: Family Medicine

## 2014-05-24 DIAGNOSIS — G8929 Other chronic pain: Secondary | ICD-10-CM

## 2014-05-24 DIAGNOSIS — I10 Essential (primary) hypertension: Secondary | ICD-10-CM

## 2014-05-24 MED ORDER — DILTIAZEM HCL ER COATED BEADS 240 MG PO CP24: 240.0000 mg | ORAL_CAPSULE | Freq: Every day | ORAL | Status: DC

## 2014-05-24 MED ORDER — QUINAPRIL HCL 40 MG PO TABS: 40.0000 mg | ORAL_TABLET | Freq: Every day | ORAL | Status: DC

## 2014-05-24 MED ORDER — PRAVASTATIN SODIUM 20 MG PO TABS: 20.0000 mg | ORAL_TABLET | Freq: Every day | ORAL | Status: DC

## 2014-05-24 MED ORDER — ATENOLOL 50 MG PO TABS: 50.0000 mg | ORAL_TABLET | Freq: Two times a day (BID) | ORAL | Status: DC

## 2014-05-24 NOTE — Telephone Encounter (Signed)
Caller name: Charlena, Haub Relation to pt: self  Call back number: [phone] Pharmacy:  [facility] ([place]) [place], [place] [phone] (Phone)      Reason for call: pt requesting a refill of; diltiazem (CARDIZEM CD) 240 MG 24 hr capsule  atenolol (TENORMIN) 50 MG tablet  quinapril (ACCUPRIL) 40 MG tablet  pravastatin (PRAVACHOL) 20 MG tablet  oxyCODONE-acetaminophen (PERCOCET/ROXICET) 5-325 MG per tablet pt would like to pick RX up Friday

## 2014-05-24 NOTE — Telephone Encounter (Signed)
Oxycodone refill Last seen 02/04/14 and filled 04/23/14 #90 UDS 02/10/13 Low risk  Please advise      KP

## 2014-05-24 NOTE — Telephone Encounter (Signed)
Refill x1 

## 2014-05-25 MED ORDER — OXYCODONE-ACETAMINOPHEN 5-325 MG PO TABS: ORAL_TABLET | ORAL | Status: DC

## 2014-05-25 NOTE — Telephone Encounter (Signed)
MSG left advising Rx ready for pick up.     KP

## 2014-06-10 ENCOUNTER — Ambulatory Visit: Payer: Medicare Other | Admitting: Family Medicine

## 2014-06-21 ENCOUNTER — Telehealth: Payer: Self-pay | Admitting: Family Medicine

## 2014-06-21 DIAGNOSIS — G8929 Other chronic pain: Secondary | ICD-10-CM

## 2014-06-21 MED ORDER — OXYCODONE-ACETAMINOPHEN 5-325 MG PO TABS: ORAL_TABLET | ORAL | Status: DC

## 2014-06-21 NOTE — Telephone Encounter (Signed)
Refill x1 

## 2014-06-21 NOTE — Telephone Encounter (Signed)
Caller name: sudie Relation to pt: self Call back number: [phone] Pharmacy:  Reason for call:   Requesting 90 day supply of oxycodone

## 2014-06-21 NOTE — Telephone Encounter (Signed)
Patient aware Rx will be ready for pick up tomorrow.      KP 

## 2014-06-21 NOTE — Telephone Encounter (Signed)
Last seen 02/04/14 and filled 05/25/14 #90 UDS 02/10/13 Low risk   Please advise    KP

## 2014-06-22 ENCOUNTER — Telehealth: Payer: Self-pay | Admitting: Family Medicine

## 2014-06-22 NOTE — Telephone Encounter (Signed)
Please advise      KP 

## 2014-06-22 NOTE — Telephone Encounter (Signed)
Surgeon should be able to get one for her

## 2014-06-22 NOTE — Telephone Encounter (Signed)
Patient has been made aware and verbalized understanding.     KP

## 2014-06-22 NOTE — Telephone Encounter (Signed)
Caller name: Guyla, Bless  Relation to pt: self  Call back number: [phone] Pharmacy:  Reason for call: pt was in the office today picking up a RX and inquired about a handicap sticker due to an upcoming surgery pt will be having.

## 2014-06-23 ENCOUNTER — Telehealth: Payer: Self-pay | Admitting: *Deleted

## 2014-06-23 NOTE — Telephone Encounter (Signed)
Patient dropped off patient assistance program paperwork for [employer] for [place]. Forms filled out and forwarded to Dr. Etter Sjogren. JG//CMA

## 2014-07-06 NOTE — Telephone Encounter (Signed)
Late entry.  Forms were faxed on 06/23/2014 and originals mailed back to patient. JG//CMA

## 2014-07-08 NOTE — Telephone Encounter (Signed)
Pt would like to know if medication was approved, please advise.

## 2014-07-12 NOTE — Telephone Encounter (Signed)
Pt will need to contact [employer]. I will let patient know if I receive an approval for her. JG//CMA

## 2014-07-16 ENCOUNTER — Encounter: Payer: Self-pay | Admitting: Family Medicine

## 2014-07-20 ENCOUNTER — Other Ambulatory Visit: Payer: Self-pay | Admitting: Physician Assistant

## 2014-07-20 NOTE — Telephone Encounter (Signed)
Received fax from [employer] stating pt has been accepted into program until 05/07/2015 for Pristiq ER tabs 100 mg.

## 2014-07-20 NOTE — H&P (Signed)
TOTAL KNEE ADMISSION H&P  Patient is being admitted for right total knee arthroplasty.  Subjective:  Chief Complaint:right knee pain.  HPI: Amanda Joyce, 74 y.o. female, has a history of pain and functional disability in the right knee due to arthritis and has failed non-surgical conservative treatments for greater than 12 weeks to includeNSAID's and/or analgesics, corticosteriod injections and activity modification.  Onset of symptoms was abrupt, starting 1 years ago with rapidlly worsening course since that time. The patient noted no past surgery on the right knee(s).  Patient currently rates pain in the right knee(s) at 6 out of 10 with activity. Patient has night pain.  Patient has evidence of subchondral sclerosis and joint space narrowing by imaging studies. There is no active infection.  Patient Active Problem List   Diagnosis Date Noted  . Viral bronchitis 03/30/2014  . Breast cancer, left breast 01/19/2014  . Insomnia 04/18/2013  . Pulmonary nodules 01/22/2013  . Cervical strain 09/14/2012  . Seborrheic dermatitis of scalp 08/18/2012  . History of breast cancer 10/30/2011  . Hip pain, right 07/02/2011  . Internal hemorrhoids without mention of complication 41/28/7867  . Internal hemorrhoids with other complication 67/20/9470  . ONYCHOMYCOSIS, BILATERAL 07/18/2010  . HYPERLIPIDEMIA 06/08/2010  . ACTINIC KERATOSIS 09/13/2009  . BAKER'S CYST 09/08/2009  . Osteoarthritis 09/06/2009  . CALF PAIN, LEFT 09/06/2009  . OCULAR MIGRAINE 08/11/2009  . KNEE PAIN, BILATERAL 03/09/2009  . DEPRESSIVE DISORDER 09/29/2008  . NECK PAIN 09/29/2008  . BACK PAIN, THORACIC REGION 06/28/2008  . UNSPECIFIED VITAMIN D DEFICIENCY 12/30/2007  . DYSPEPSIA&OTHER [place] DISORDERS FUNCTION STOMACH 12/25/2007  . DYSPHAGIA UNSPECIFIED 12/25/2007  . ANXIETY STATE, UNSPECIFIED 12/22/2007  . HIATAL HERNIA WITH REFLUX 12/22/2007  . Inflamed Seborrheic Keratosis 06/26/2007  . GOUT 06/10/2007  . Allergic  Rhinitis, Cause Unspecified 02/20/2007  . 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 OSTEOARTHRITIS 09/19/2006  . HX, PERSONAL, MALIGNANCY, BREAST 09/19/2006  . THYROID NODULE 09/18/2006  . HYPERTENSION 09/18/2006  . IBS 09/18/2006  . ARTHRITIS 09/18/2006   Past Medical History  Diagnosis Date  . Arthritis   . Hypertension   . Depression   . Gout   . Temporomandibular joint disorders, unspecified   . Personal history of malignant neoplasm of breast   . Breast cancer     Left   . Osteoarthrosis, unspecified whether generalized or localized, unspecified site   . Irritable bowel syndrome   . Nontoxic uninodular goiter   . Hyperlipemia   . Hiatal hernia   . GERD (gastroesophageal reflux disease)     Past Surgical History  Procedure Laterality Date  . Appendectomy    . Hernia repair    . Abdominal hysterectomy    . Thyroid surgery    . Breast lumpectomy      Left breast X2     (Not in a hospital admission) Allergies  Allergen Reactions  . Clarithromycin     REACTION: NIGHTMARES  . Influenza Virus Vaccine Split Nausea And Vomiting  . Penicillins     REACTION: ITCHING    History  Substance Use Topics  . Smoking status: Never Smoker   . Smokeless tobacco: Never Used  . Alcohol Use: No    Family History  Problem Relation Age of Onset  . Coronary artery disease Father   . Liver cancer Paternal Uncle   . Alcohol abuse Paternal Uncle   . Colon cancer Neg Hx   . Other Mother     deceased from brain tumor  Review of Systems  Constitutional: Negative.   HENT: Negative for hearing loss and tinnitus.   Eyes: Negative.   Respiratory: Negative.   Cardiovascular: Negative.   Gastrointestinal: Negative.   Genitourinary: Positive for frequency. Negative for dysuria, urgency and hematuria.  Musculoskeletal: Positive for joint pain.  Skin: Negative.   Neurological: Positive for headaches.  Endo/Heme/Allergies: Bruises/bleeds easily.  Psychiatric/Behavioral: Negative for depression  and suicidal ideas. The patient is nervous/anxious.     Objective:  Physical Exam  Constitutional: She is oriented to person, place, and time. She appears well-developed and well-nourished.  HENT:  Head: Normocephalic and atraumatic.  Eyes: EOM are normal. Pupils are equal, round, and reactive to light.  Neck: Normal range of motion. Neck supple.  Cardiovascular: Normal rate and regular rhythm.   Murmur heard. Respiratory: Effort normal and breath sounds normal. No respiratory distress. She has no wheezes. She has no rales.  GI: Soft. Bowel sounds are normal. She exhibits no distension.  Musculoskeletal:  Examination of her right knee reveals an antalgic gait with a Trendelenburg component on the right.  Varus thrust.  Moderate patellofemoral crepitus.  Medial joint line tenderness to palpation.  5 degree extension lag, 100 degrees of flexion.  Negative log roll and negative straight leg raise, both sides.    Neurological: She is alert and oriented to person, place, and time.  Skin: Skin is warm and dry.  Psychiatric: She has a normal mood and affect. Her behavior is normal. Judgment and thought content normal.    Vital signs in last 24 hours: @VSRANGES @  Labs:   Estimated body mass index is 27.31 kg/(m^2) as calculated from the following:   Height as of 01/19/14: 5\' 8"  (1.727 m).   Weight as of 03/30/14: 81.466 kg (179 lb 9.6 oz).   Imaging Review Plain radiographs demonstrate severe degenerative joint disease of the right knee(s). The overall alignment ismild varus. The bone quality appears to be fair for age and reported activity level.  Assessment/Plan:  End stage arthritis, right knee   The patient history, physical examination, clinical judgment of the provider and imaging studies are consistent with end stage degenerative joint disease of the right knee(s) and total knee arthroplasty is deemed medically necessary. The treatment options including medical management,  injection therapy arthroscopy and arthroplasty were discussed at length. The risks and benefits of total knee arthroplasty were presented and reviewed. The risks due to aseptic loosening, infection, stiffness, patella tracking problems, thromboembolic complications and other imponderables were discussed. The patient acknowledged the explanation, agreed to proceed with the plan and consent was signed. Patient is being admitted for inpatient treatment for surgery, pain control, PT, OT, prophylactic antibiotics, VTE prophylaxis, progressive ambulation and ADL's and discharge planning. The patient is planning to be discharged home with home health services

## 2014-07-21 ENCOUNTER — Telehealth: Payer: Self-pay

## 2014-07-21 NOTE — Telephone Encounter (Signed)
Received Pristiq from [employer] and VM left making the patient aware Rx left at check in.       KP

## 2014-07-23 ENCOUNTER — Encounter (HOSPITAL_COMMUNITY): Payer: Self-pay

## 2014-07-23 ENCOUNTER — Encounter (HOSPITAL_COMMUNITY)
Admission: RE | Admit: 2014-07-23 | Discharge: 2014-07-23 | Disposition: A | Discharge status: Home / Self Care | Payer: Medicare Other | Source: Ambulatory Visit | Attending: Orthopedic Surgery | Admitting: Orthopedic Surgery

## 2014-07-23 DIAGNOSIS — Z01810 Encounter for preprocedural cardiovascular examination: Secondary | ICD-10-CM | POA: Diagnosis not present

## 2014-07-23 DIAGNOSIS — Z01812 Encounter for preprocedural laboratory examination: Secondary | ICD-10-CM | POA: Diagnosis present

## 2014-07-23 DIAGNOSIS — Z0183 Encounter for blood typing: Secondary | ICD-10-CM | POA: Diagnosis not present

## 2014-07-23 DIAGNOSIS — M179 Osteoarthritis of knee, unspecified: Secondary | ICD-10-CM | POA: Diagnosis not present

## 2014-07-23 HISTORY — DX: Personal history of other medical treatment: Z92.89

## 2014-07-23 HISTORY — DX: Anxiety disorder, unspecified: F41.9

## 2014-07-23 LAB — URINALYSIS, ROUTINE W REFLEX MICROSCOPIC
Bilirubin Urine: NEGATIVE
Glucose, UA: NEGATIVE mg/dL
KETONES UR: NEGATIVE mg/dL
Nitrite: POSITIVE — AB
PROTEIN: NEGATIVE mg/dL
SPECIFIC GRAVITY, URINE: 1.021 (ref 1.005–1.030)
Urobilinogen, UA: 0.2 mg/dL (ref 0.0–1.0)
pH: 5.5 (ref 5.0–8.0)

## 2014-07-23 LAB — COMPREHENSIVE METABOLIC PANEL
ALK PHOS: 58 U/L (ref 39–117)
ALT: 16 U/L (ref 0–35)
AST: 22 U/L (ref 0–37)
Albumin: 4.3 g/dL (ref 3.5–5.2)
Anion gap: 9 (ref 5–15)
BUN: 16 mg/dL (ref 6–23)
CALCIUM: 10 mg/dL (ref 8.4–10.5)
CO2: 28 mmol/L (ref 19–32)
Chloride: 100 mmol/L (ref 96–112)
Creatinine, Ser: 0.75 mg/dL (ref 0.50–1.10)
GFR calc Af Amer: >90 mL/min (ref >90)
GFR calc non Af Amer: 82 mL/min — ABNORMAL LOW (ref >90)
Glucose, Bld: 97 mg/dL (ref 70–99)
POTASSIUM: 3.8 mmol/L (ref 3.5–5.1)
Sodium: 137 mmol/L (ref 135–145)
Total Bilirubin: 0.9 mg/dL (ref 0.3–1.2)
Total Protein: 7.3 g/dL (ref 6.0–8.3)

## 2014-07-23 LAB — TYPE AND SCREEN
ABO/RH(D): O POS
Antibody Screen: NEGATIVE

## 2014-07-23 LAB — APTT: aPTT: 39 seconds — ABNORMAL HIGH (ref 24–37)

## 2014-07-23 LAB — CBC WITH DIFFERENTIAL/PLATELET
Basophils Absolute: 0.1 10*3/uL (ref 0.0–0.1)
Basophils Relative: 1 % (ref 0–1)
Eosinophils Absolute: 0.3 10*3/uL (ref 0.0–0.7)
Eosinophils Relative: 3 % (ref 0–5)
HEMATOCRIT: 43.7 % (ref 36.0–46.0)
HEMOGLOBIN: 14.7 g/dL (ref 12.0–15.0)
Lymphocytes Relative: 30 % (ref 12–46)
Lymphs Abs: 2.9 10*3/uL (ref 0.7–4.0)
MCH: 29.9 pg (ref 26.0–34.0)
MCHC: 33.6 g/dL (ref 30.0–36.0)
MCV: 88.8 fL (ref 78.0–100.0)
MONO ABS: 0.7 10*3/uL (ref 0.1–1.0)
MONOS PCT: 8 % (ref 3–12)
Neutro Abs: 5.5 10*3/uL (ref 1.7–7.7)
Neutrophils Relative %: 58 % (ref 43–77)
Platelets: 345 10*3/uL (ref 150–400)
RBC: 4.92 MIL/uL (ref 3.87–5.11)
RDW: 13.2 % (ref 11.5–15.5)
WBC: 9.5 10*3/uL (ref 4.0–10.5)

## 2014-07-23 LAB — URINE MICROSCOPIC-ADD ON

## 2014-07-23 LAB — PROTIME-INR
INR: 1.04 (ref 0.00–1.49)
Prothrombin Time: 13.8 seconds (ref 11.6–15.2)

## 2014-07-23 LAB — ABO/RH: ABO/RH(D): O POS

## 2014-07-23 LAB — SURGICAL PCR SCREEN
MRSA, PCR: NEGATIVE
Staphylococcus aureus: NEGATIVE

## 2014-07-23 NOTE — Progress Notes (Signed)
[facility]. Called to complete med. Rec.

## 2014-07-23 NOTE — Pre-Procedure Instructions (Signed)
Amanda Joyce  07/23/2014   Your procedure is scheduled on:  08/04/2014  Report to [facility] Admitting at 12:30 PM.  Call this number if you have problems the morning of surgery: [phone]   Remember:  Discontinue vitamins, anti-inflammatories meds., aspirin on 07/31/2014   Do not eat food or drink liquids after midnight.  on Tuesday NIGHT  Take these medicines the morning of surgery with A SIP OF WATER: Atenolol, Pristiq, Diltiazem    Do not wear jewelry, make-up or nail polish.   Do not wear lotions, powders, or perfumes. You may wear deodorant.   Do not shave 48 hours prior to surgery.    Do not bring valuables to the hospital.  [facility] is not responsible                  for any belongings or valuables.               Contacts, dentures or bridgework may not be worn into surgery.   Leave suitcase in the car. After surgery it may be brought to your room.   For patients admitted to the hospital, discharge time is determined by your                treatment team.               Patients discharged the day of surgery will not be allowed to drive  home.  Name and phone number of your driver: with family  Special Instructions: Special Instructions: [place] - Preparing for Surgery  Before surgery, you can play an important role.  Because skin is not sterile, your skin needs to be as free of germs as possible.  You can reduce the number of germs on you skin by washing with CHG (chlorahexidine gluconate) soap before surgery.  CHG is an antiseptic cleaner which kills germs and bonds with the skin to continue killing germs even after washing.  Please DO NOT use if you have an allergy to CHG or antibacterial soaps.  If your skin becomes reddened/irritated stop using the CHG and inform your nurse when you arrive at Short Stay.  Do not shave (including legs and underarms) for at least 48 hours prior to the first CHG shower.  You may shave your face.  Please follow  these instructions carefully:   1.  Shower with CHG Soap the night before surgery and the  morning of Surgery.  2.  If you choose to wash your hair, wash your hair first as usual with your  normal shampoo.  3.  After you shampoo, rinse your hair and body thoroughly to remove the  Shampoo.  4.  Use CHG as you would any other liquid soap.  You can apply chg directly to the skin and wash gently with scrungie or a clean washcloth.  5.  Apply the CHG Soap to your body ONLY FROM THE NECK DOWN.    Do not use on open wounds or open sores.  Avoid contact with your eyes, ears, mouth and genitals (private parts).  Wash genitals (private parts)   with your normal soap.  6.  Wash thoroughly, paying special attention to the area where your surgery will be performed.  7.  Thoroughly rinse your body with warm water from the neck down.  8.  DO NOT shower/wash with your normal soap after using and rinsing off   the CHG Soap.  9.  Pat yourself dry with  a clean towel.            10.  Wear clean pajamas.            11.  Place clean sheets on your bed the night of your first shower and do not sleep with pets.  Day of Surgery  Do not apply any lotions/deodorants the morning of surgery.  Please wear clean clothes to the hospital/surgery center.   Please read over the following fact sheets that you were given: Pain Booklet, Coughing and Deep Breathing, Blood Transfusion Information, MRSA Information and Surgical Site Infection Prevention

## 2014-07-26 ENCOUNTER — Telehealth: Payer: Self-pay | Admitting: Family Medicine

## 2014-07-26 DIAGNOSIS — G8929 Other chronic pain: Secondary | ICD-10-CM

## 2014-07-26 LAB — URINE CULTURE: Colony Count: >=100000

## 2014-07-26 MED ORDER — OXYCODONE-ACETAMINOPHEN 5-325 MG PO TABS: ORAL_TABLET | ORAL | Status: DC

## 2014-07-26 NOTE — Telephone Encounter (Signed)
Last seen 02/04/14 and filled 06/21/14 #90 UDS 02/10/13 low risk   Please advise     KP

## 2014-07-26 NOTE — Telephone Encounter (Signed)
Caller name: Alexsis, Kathman Relation to pt: self  Call back number: [phone]   Reason for call:  Pt requesting a refill oxyCODONE-acetaminophen (PERCOCET/ROXICET) 5-325 MG per tablet

## 2014-07-26 NOTE — Telephone Encounter (Signed)
Refill x1---- UDS

## 2014-07-26 NOTE — Telephone Encounter (Signed)
Patient aware Rx ready for pick up.      KP 

## 2014-08-03 MED ORDER — CHLORHEXIDINE GLUCONATE 4 % EX LIQD
60.0000 mL | Freq: Once | CUTANEOUS | Status: DC
  Filled 2014-08-03: qty 60

## 2014-08-03 MED ORDER — LACTATED RINGERS IV SOLN
INTRAVENOUS | Status: DC
  Administered 2014-08-04 (×2): via INTRAVENOUS

## 2014-08-03 MED ORDER — CLINDAMYCIN PHOSPHATE 900 MG/50ML IV SOLN
900.0000 mg | INTRAVENOUS | Status: AC
  Administered 2014-08-04: 900 mg via INTRAVENOUS
  Filled 2014-08-03: qty 50

## 2014-08-03 NOTE — Progress Notes (Signed)
Message left for pt. Regarding  Change in surgery time.the patient. Ask to arrive at 10:30 AM.and to call [phone] to let us know that she received message.

## 2014-08-04 ENCOUNTER — Inpatient Hospital Stay (HOSPITAL_COMMUNITY): Payer: Medicare Other | Admitting: Certified Registered"

## 2014-08-04 ENCOUNTER — Inpatient Hospital Stay (HOSPITAL_COMMUNITY)
Admission: RE | Admit: 2014-08-04 | Discharge: 2014-08-06 | DRG: 470 | Disposition: A | Discharge status: Home Health | Payer: Medicare Other | Source: Ambulatory Visit | Attending: Orthopedic Surgery | Admitting: Orthopedic Surgery

## 2014-08-04 ENCOUNTER — Encounter (HOSPITAL_COMMUNITY): Payer: Self-pay | Admitting: *Deleted

## 2014-08-04 ENCOUNTER — Encounter (HOSPITAL_COMMUNITY)
Admission: RE | Disposition: A | Discharge status: Home Health | Payer: Self-pay | Source: Ambulatory Visit | Attending: Orthopedic Surgery

## 2014-08-04 ENCOUNTER — Inpatient Hospital Stay (HOSPITAL_COMMUNITY): Payer: Medicare Other

## 2014-08-04 DIAGNOSIS — M179 Osteoarthritis of knee, unspecified: Secondary | ICD-10-CM | POA: Diagnosis present

## 2014-08-04 DIAGNOSIS — K589 Irritable bowel syndrome without diarrhea: Secondary | ICD-10-CM | POA: Diagnosis present

## 2014-08-04 DIAGNOSIS — F419 Anxiety disorder, unspecified: Secondary | ICD-10-CM | POA: Diagnosis present

## 2014-08-04 DIAGNOSIS — K449 Diaphragmatic hernia without obstruction or gangrene: Secondary | ICD-10-CM | POA: Diagnosis present

## 2014-08-04 DIAGNOSIS — R110 Nausea: Secondary | ICD-10-CM | POA: Diagnosis not present

## 2014-08-04 DIAGNOSIS — M1711 Unilateral primary osteoarthritis, right knee: Principal | ICD-10-CM | POA: Diagnosis present

## 2014-08-04 DIAGNOSIS — I10 Essential (primary) hypertension: Secondary | ICD-10-CM | POA: Diagnosis present

## 2014-08-04 DIAGNOSIS — M25561 Pain in right knee: Secondary | ICD-10-CM | POA: Diagnosis present

## 2014-08-04 DIAGNOSIS — F329 Major depressive disorder, single episode, unspecified: Secondary | ICD-10-CM | POA: Diagnosis present

## 2014-08-04 DIAGNOSIS — Z7901 Long term (current) use of anticoagulants: Secondary | ICD-10-CM

## 2014-08-04 DIAGNOSIS — D62 Acute posthemorrhagic anemia: Secondary | ICD-10-CM | POA: Diagnosis not present

## 2014-08-04 DIAGNOSIS — Z79899 Other long term (current) drug therapy: Secondary | ICD-10-CM | POA: Diagnosis not present

## 2014-08-04 DIAGNOSIS — Z853 Personal history of malignant neoplasm of breast: Secondary | ICD-10-CM | POA: Diagnosis not present

## 2014-08-04 DIAGNOSIS — K219 Gastro-esophageal reflux disease without esophagitis: Secondary | ICD-10-CM | POA: Diagnosis present

## 2014-08-04 DIAGNOSIS — E785 Hyperlipidemia, unspecified: Secondary | ICD-10-CM | POA: Diagnosis present

## 2014-08-04 DIAGNOSIS — Z96659 Presence of unspecified artificial knee joint: Secondary | ICD-10-CM

## 2014-08-04 DIAGNOSIS — M1710 Unilateral primary osteoarthritis, unspecified knee: Secondary | ICD-10-CM | POA: Diagnosis present

## 2014-08-04 HISTORY — PX: TOTAL KNEE ARTHROPLASTY: SHX125

## 2014-08-04 LAB — CREATININE, SERUM
Creatinine, Ser: 0.81 mg/dL (ref 0.50–1.10)
GFR calc Af Amer: 81 mL/min — ABNORMAL LOW (ref >90)
GFR calc non Af Amer: 70 mL/min — ABNORMAL LOW (ref >90)

## 2014-08-04 LAB — CBC
HEMATOCRIT: 38.2 % (ref 36.0–46.0)
HEMOGLOBIN: 12 g/dL (ref 12.0–15.0)
MCH: 28.4 pg (ref 26.0–34.0)
MCHC: 31.4 g/dL (ref 30.0–36.0)
MCV: 90.3 fL (ref 78.0–100.0)
Platelets: 275 10*3/uL (ref 150–400)
RBC: 4.23 MIL/uL (ref 3.87–5.11)
RDW: 13.5 % (ref 11.5–15.5)
WBC: 15.1 10*3/uL — ABNORMAL HIGH (ref 4.0–10.5)

## 2014-08-04 SURGERY — ARTHROPLASTY, KNEE, TOTAL
Anesthesia: General | Site: Knee | Laterality: Right

## 2014-08-04 MED ORDER — ACETAMINOPHEN 325 MG PO TABS: 650.0000 mg | ORAL_TABLET | Freq: Four times a day (QID) | ORAL | Status: DC | PRN

## 2014-08-04 MED ORDER — VENLAFAXINE HCL ER 150 MG PO CP24
150.0000 mg | ORAL_CAPSULE | Freq: Every day | ORAL | Status: DC
  Administered 2014-08-05 – 2014-08-06 (×2): 150 mg via ORAL
  Filled 2014-08-04 (×2): qty 1

## 2014-08-04 MED ORDER — ATENOLOL 50 MG PO TABS
50.0000 mg | ORAL_TABLET | Freq: Two times a day (BID) | ORAL | Status: DC
  Administered 2014-08-04 – 2014-08-06 (×4): 50 mg via ORAL
  Filled 2014-08-04 (×4): qty 1

## 2014-08-04 MED ORDER — HYDROMORPHONE HCL 1 MG/ML IJ SOLN
INTRAMUSCULAR | Status: AC
  Filled 2014-08-04: qty 1

## 2014-08-04 MED ORDER — MENTHOL 3 MG MT LOZG: 1.0000 | LOZENGE | OROMUCOSAL | Status: DC | PRN

## 2014-08-04 MED ORDER — OXYCODONE-ACETAMINOPHEN 5-325 MG PO TABS: 1.0000 | ORAL_TABLET | ORAL | Status: DC | PRN

## 2014-08-04 MED ORDER — GLYCOPYRROLATE 0.2 MG/ML IJ SOLN
INTRAMUSCULAR | Status: AC
  Filled 2014-08-04: qty 1

## 2014-08-04 MED ORDER — ONDANSETRON HCL 4 MG PO TABS: 4.0000 mg | ORAL_TABLET | Freq: Four times a day (QID) | ORAL | Status: DC | PRN

## 2014-08-04 MED ORDER — BISACODYL 5 MG PO TBEC: 5.0000 mg | DELAYED_RELEASE_TABLET | Freq: Every day | ORAL | Status: DC | PRN

## 2014-08-04 MED ORDER — ONDANSETRON HCL 4 MG/2ML IJ SOLN
INTRAMUSCULAR | Status: DC | PRN
Start: 2014-08-04 — End: 2014-08-04
  Administered 2014-08-04: 4 mg via INTRAVENOUS

## 2014-08-04 MED ORDER — CLINDAMYCIN PHOSPHATE 600 MG/50ML IV SOLN
600.0000 mg | Freq: Four times a day (QID) | INTRAVENOUS | Status: AC
  Administered 2014-08-04 (×2): 600 mg via INTRAVENOUS
  Filled 2014-08-04 (×3): qty 50

## 2014-08-04 MED ORDER — MEPERIDINE HCL 25 MG/ML IJ SOLN: 6.2500 mg | INTRAMUSCULAR | Status: DC | PRN

## 2014-08-04 MED ORDER — METHOCARBAMOL 1000 MG/10ML IJ SOLN
500.0000 mg | Freq: Four times a day (QID) | INTRAVENOUS | Status: DC | PRN
  Filled 2014-08-04: qty 5

## 2014-08-04 MED ORDER — METOCLOPRAMIDE HCL 5 MG PO TABS: 5.0000 mg | ORAL_TABLET | Freq: Three times a day (TID) | ORAL | Status: DC | PRN

## 2014-08-04 MED ORDER — HYDRALAZINE HCL 20 MG/ML IJ SOLN
5.0000 mg | INTRAMUSCULAR | Status: AC | PRN
  Administered 2014-08-04 (×2): 5 mg via INTRAVENOUS

## 2014-08-04 MED ORDER — PHENOL 1.4 % MT LIQD: 1.0000 | OROMUCOSAL | Status: DC | PRN

## 2014-08-04 MED ORDER — METOCLOPRAMIDE HCL 5 MG/ML IJ SOLN: 5.0000 mg | Freq: Three times a day (TID) | INTRAMUSCULAR | Status: DC | PRN

## 2014-08-04 MED ORDER — POTASSIUM CHLORIDE IN NACL 20-0.9 MEQ/L-% IV SOLN
INTRAVENOUS | Status: DC
  Administered 2014-08-04 – 2014-08-05 (×2): via INTRAVENOUS
  Filled 2014-08-04 (×5): qty 1000

## 2014-08-04 MED ORDER — ONDANSETRON HCL 4 MG/2ML IJ SOLN: 4.0000 mg | Freq: Four times a day (QID) | INTRAMUSCULAR | Status: DC | PRN

## 2014-08-04 MED ORDER — QUINAPRIL HCL 10 MG PO TABS
40.0000 mg | ORAL_TABLET | Freq: Every day | ORAL | Status: DC
  Administered 2014-08-05 – 2014-08-06 (×2): 40 mg via ORAL
  Filled 2014-08-04 (×3): qty 4

## 2014-08-04 MED ORDER — LIDOCAINE HCL (CARDIAC) 20 MG/ML IV SOLN
INTRAVENOUS | Status: DC | PRN
  Administered 2014-08-04: 50 mg via INTRAVENOUS

## 2014-08-04 MED ORDER — SUCCINYLCHOLINE CHLORIDE 20 MG/ML IJ SOLN
INTRAMUSCULAR | Status: DC | PRN
  Administered 2014-08-04: 100 mg via INTRAVENOUS

## 2014-08-04 MED ORDER — PROPOFOL 10 MG/ML IV BOLUS
INTRAVENOUS | Status: DC | PRN
  Administered 2014-08-04: 200 mg via INTRAVENOUS

## 2014-08-04 MED ORDER — BUPIVACAINE HCL 0.5 % IJ SOLN
INTRAMUSCULAR | Status: DC | PRN
  Administered 2014-08-04: 10 mL

## 2014-08-04 MED ORDER — ENOXAPARIN SODIUM 30 MG/0.3ML ~~LOC~~ SOLN: 30.0000 mg | Freq: Two times a day (BID) | SUBCUTANEOUS | Status: DC

## 2014-08-04 MED ORDER — SENNOSIDES-DOCUSATE SODIUM 8.6-50 MG PO TABS
1.0000 | ORAL_TABLET | Freq: Every evening | ORAL | Status: DC | PRN
  Administered 2014-08-05: 1 via ORAL
  Filled 2014-08-04: qty 1

## 2014-08-04 MED ORDER — ENOXAPARIN SODIUM 30 MG/0.3ML ~~LOC~~ SOLN
30.0000 mg | Freq: Two times a day (BID) | SUBCUTANEOUS | Status: DC
  Administered 2014-08-05 – 2014-08-06 (×3): 30 mg via SUBCUTANEOUS
  Filled 2014-08-04 (×3): qty 0.3

## 2014-08-04 MED ORDER — OXYCODONE HCL 5 MG PO TABS
5.0000 mg | ORAL_TABLET | ORAL | Status: DC | PRN
  Administered 2014-08-04 – 2014-08-05 (×4): 10 mg via ORAL
  Administered 2014-08-06 (×3): 5 mg via ORAL
  Filled 2014-08-04 (×2): qty 2
  Filled 2014-08-04: qty 1
  Filled 2014-08-04: qty 2
  Filled 2014-08-04: qty 1
  Filled 2014-08-04: qty 2
  Filled 2014-08-04: qty 1

## 2014-08-04 MED ORDER — DEXAMETHASONE SODIUM PHOSPHATE 10 MG/ML IJ SOLN
10.0000 mg | Freq: Once | INTRAMUSCULAR | Status: AC
  Administered 2014-08-05: 10 mg via INTRAVENOUS
  Filled 2014-08-04: qty 1

## 2014-08-04 MED ORDER — FENTANYL CITRATE 0.05 MG/ML IJ SOLN
INTRAMUSCULAR | Status: DC | PRN
  Administered 2014-08-04 (×5): 50 ug via INTRAVENOUS

## 2014-08-04 MED ORDER — FLEET ENEMA 7-19 GM/118ML RE ENEM: 1.0000 | ENEMA | Freq: Once | RECTAL | Status: AC | PRN

## 2014-08-04 MED ORDER — SODIUM CHLORIDE 0.9 % IR SOLN
Status: DC | PRN
  Administered 2014-08-04: 1

## 2014-08-04 MED ORDER — METHOCARBAMOL 500 MG PO TABS
500.0000 mg | ORAL_TABLET | Freq: Four times a day (QID) | ORAL | Status: DC | PRN
  Administered 2014-08-04 – 2014-08-05 (×3): 500 mg via ORAL
  Filled 2014-08-04 (×4): qty 1

## 2014-08-04 MED ORDER — ACETAMINOPHEN 650 MG RE SUPP: 650.0000 mg | Freq: Four times a day (QID) | RECTAL | Status: DC | PRN

## 2014-08-04 MED ORDER — HYDROMORPHONE HCL 1 MG/ML IJ SOLN
0.2500 mg | INTRAMUSCULAR | Status: DC | PRN
  Administered 2014-08-04 (×3): 0.5 mg via INTRAVENOUS

## 2014-08-04 MED ORDER — ALPRAZOLAM 0.25 MG PO TABS
0.2500 mg | ORAL_TABLET | Freq: Three times a day (TID) | ORAL | Status: DC | PRN
Start: 2014-08-04 — End: 2014-08-06
  Filled 2014-08-04: qty 1

## 2014-08-04 MED ORDER — BUPIVACAINE LIPOSOME 1.3 % IJ SUSP
20.0000 mL | INTRAMUSCULAR | Status: AC
  Administered 2014-08-04: 20 mL
  Filled 2014-08-04: qty 20

## 2014-08-04 MED ORDER — ONDANSETRON HCL 4 MG PO TABS: 4.0000 mg | ORAL_TABLET | Freq: Three times a day (TID) | ORAL | Status: DC | PRN

## 2014-08-04 MED ORDER — CELECOXIB 200 MG PO CAPS
200.0000 mg | ORAL_CAPSULE | Freq: Two times a day (BID) | ORAL | Status: DC
  Administered 2014-08-04 – 2014-08-06 (×4): 200 mg via ORAL
  Filled 2014-08-04 (×4): qty 1

## 2014-08-04 MED ORDER — PRAVASTATIN SODIUM 20 MG PO TABS
20.0000 mg | ORAL_TABLET | Freq: Every day | ORAL | Status: DC
  Administered 2014-08-04 – 2014-08-05 (×2): 20 mg via ORAL
  Filled 2014-08-04 (×3): qty 1

## 2014-08-04 MED ORDER — DIPHENHYDRAMINE HCL 12.5 MG/5ML PO ELIX: 12.5000 mg | ORAL_SOLUTION | ORAL | Status: DC | PRN

## 2014-08-04 MED ORDER — FENTANYL CITRATE 0.05 MG/ML IJ SOLN
INTRAMUSCULAR | Status: AC
  Filled 2014-08-04: qty 5

## 2014-08-04 MED ORDER — METHOCARBAMOL 500 MG PO TABS: 500.0000 mg | ORAL_TABLET | Freq: Four times a day (QID) | ORAL | Status: DC

## 2014-08-04 MED ORDER — DILTIAZEM HCL ER COATED BEADS 240 MG PO CP24
240.0000 mg | ORAL_CAPSULE | Freq: Every day | ORAL | Status: DC
  Administered 2014-08-05 – 2014-08-06 (×2): 240 mg via ORAL
  Filled 2014-08-04 (×3): qty 1

## 2014-08-04 MED ORDER — GLYCOPYRROLATE 0.2 MG/ML IJ SOLN
INTRAMUSCULAR | Status: DC | PRN
  Administered 2014-08-04: 0.2 mg via INTRAVENOUS

## 2014-08-04 MED ORDER — ZOLPIDEM TARTRATE 5 MG PO TABS
5.0000 mg | ORAL_TABLET | Freq: Every evening | ORAL | Status: DC | PRN
Start: 2014-08-04 — End: 2014-08-06

## 2014-08-04 MED ORDER — HYDROMORPHONE HCL 1 MG/ML IJ SOLN
0.5000 mg | INTRAMUSCULAR | Status: DC | PRN
  Administered 2014-08-04 – 2014-08-05 (×7): 1 mg via INTRAVENOUS
  Filled 2014-08-04 (×7): qty 1

## 2014-08-04 MED ORDER — IPRATROPIUM BROMIDE 0.06 % NA SOLN
2.0000 | Freq: Three times a day (TID) | NASAL | Status: DC | PRN
  Filled 2014-08-04: qty 15

## 2014-08-04 MED ORDER — PROMETHAZINE HCL 25 MG/ML IJ SOLN: 6.2500 mg | INTRAMUSCULAR | Status: DC | PRN

## 2014-08-04 MED ORDER — HYDRALAZINE HCL 20 MG/ML IJ SOLN
INTRAMUSCULAR | Status: AC
  Filled 2014-08-04: qty 1

## 2014-08-04 MED ORDER — METHOCARBAMOL 1000 MG/10ML IJ SOLN
500.0000 mg | INTRAVENOUS | Status: AC
  Administered 2014-08-04: 500 mg via INTRAVENOUS
  Filled 2014-08-04: qty 5

## 2014-08-04 SURGICAL SUPPLY — 68 items
APL SKNCLS STERI-STRIP NONHPOA (GAUZE/BANDAGES/DRESSINGS) ×1
BANDAGE ELASTIC 4 VELCRO ST LF (GAUZE/BANDAGES/DRESSINGS) ×2 IMPLANT
BANDAGE ELASTIC 6 VELCRO ST LF (GAUZE/BANDAGES/DRESSINGS) ×2 IMPLANT
BANDAGE ESMARK 6X9 LF (GAUZE/BANDAGES/DRESSINGS) ×1 IMPLANT
BENZOIN TINCTURE PRP APPL 2/3 (GAUZE/BANDAGES/DRESSINGS) ×2 IMPLANT
BLADE SAG 18X100X1.27 (BLADE) ×4 IMPLANT
BNDG CMPR 9X6 STRL LF SNTH (GAUZE/BANDAGES/DRESSINGS) ×1
BNDG ESMARK 6X9 LF (GAUZE/BANDAGES/DRESSINGS) ×2
BOWL SMART MIX CTS (DISPOSABLE) ×2 IMPLANT
CAPT KNEE TOTAL 3 ×1 IMPLANT
CEMENT BONE SIMPLEX SPEEDSET (Cement) ×4 IMPLANT
COVER SURGICAL LIGHT HANDLE (MISCELLANEOUS) ×2 IMPLANT
CUFF TOURNIQUET SINGLE 34IN LL (TOURNIQUET CUFF) ×2 IMPLANT
DRAPE EXTREMITY T 121X128X90 (DRAPE) ×2 IMPLANT
DRAPE IMP U-DRAPE 54X76 (DRAPES) ×2 IMPLANT
DRAPE PROXIMA HALF (DRAPES) ×2 IMPLANT
DRAPE U-SHAPE 47X51 STRL (DRAPES) ×2 IMPLANT
DRSG PAD ABDOMINAL 8X10 ST (GAUZE/BANDAGES/DRESSINGS) ×2 IMPLANT
DURAPREP 26ML APPLICATOR (WOUND CARE) ×3 IMPLANT
ELECT CAUTERY BLADE 6.4 (BLADE) ×2 IMPLANT
ELECT REM PT RETURN 9FT ADLT (ELECTROSURGICAL) ×2
ELECTRODE REM PT RTRN 9FT ADLT (ELECTROSURGICAL) ×1 IMPLANT
EVACUATOR 1/8 PVC DRAIN (DRAIN) ×2 IMPLANT
FACESHIELD WRAPAROUND (MASK) ×4 IMPLANT
FACESHIELD WRAPAROUND OR TEAM (MASK) ×2 IMPLANT
GAUZE SPONGE 4X4 12PLY STRL (GAUZE/BANDAGES/DRESSINGS) ×2 IMPLANT
GLOVE BIOGEL PI IND STRL 7.0 (GLOVE) ×2 IMPLANT
GLOVE BIOGEL PI INDICATOR 7.0 (GLOVE) ×4
GLOVE ECLIPSE 6.5 STRL STRAW (GLOVE) ×6 IMPLANT
GLOVE ORTHO TXT STRL SZ7.5 (GLOVE) ×3 IMPLANT
GOWN STRL REUS W/ TWL LRG LVL3 (GOWN DISPOSABLE) ×2 IMPLANT
GOWN STRL REUS W/ TWL XL LVL3 (GOWN DISPOSABLE) ×1 IMPLANT
GOWN STRL REUS W/TWL LRG LVL3 (GOWN DISPOSABLE) ×4
GOWN STRL REUS W/TWL XL LVL3 (GOWN DISPOSABLE) ×2
HANDPIECE INTERPULSE COAX TIP (DISPOSABLE) ×2
IMMOBILIZER KNEE 22 UNIV (SOFTGOODS) ×2 IMPLANT
IMMOBILIZER KNEE 24 THIGH 36 (MISCELLANEOUS) IMPLANT
IMMOBILIZER KNEE 24 UNIV (MISCELLANEOUS)
KIT BASIN OR (CUSTOM PROCEDURE TRAY) ×2 IMPLANT
KIT ROOM TURNOVER OR (KITS) ×2 IMPLANT
MANIFOLD NEPTUNE II (INSTRUMENTS) ×2 IMPLANT
NDL 18GX1X1/2 (RX/OR ONLY) (NEEDLE) ×1 IMPLANT
NDL HYPO 25GX1X1/2 BEV (NEEDLE) ×1 IMPLANT
NEEDLE 18GX1X1/2 (RX/OR ONLY) (NEEDLE) ×2 IMPLANT
NEEDLE HYPO 25GX1X1/2 BEV (NEEDLE) ×2 IMPLANT
NS IRRIG 1000ML POUR BTL (IV SOLUTION) ×2 IMPLANT
PACK TOTAL JOINT (CUSTOM PROCEDURE TRAY) ×2 IMPLANT
PACK UNIVERSAL I (CUSTOM PROCEDURE TRAY) ×2 IMPLANT
PAD ARMBOARD 7.5X6 YLW CONV (MISCELLANEOUS) ×4 IMPLANT
PAD CAST 4YDX4 CTTN HI CHSV (CAST SUPPLIES) ×1 IMPLANT
PADDING CAST COTTON 4X4 STRL (CAST SUPPLIES) ×2
PADDING CAST COTTON 6X4 STRL (CAST SUPPLIES) ×2 IMPLANT
SET HNDPC FAN SPRY TIP SCT (DISPOSABLE) ×1 IMPLANT
SPONGE GAUZE 4X4 12PLY STER LF (GAUZE/BANDAGES/DRESSINGS) ×1 IMPLANT
STRIP CLOSURE SKIN 1/2X4 (GAUZE/BANDAGES/DRESSINGS) ×4 IMPLANT
SUCTION FRAZIER TIP 10 FR DISP (SUCTIONS) ×2 IMPLANT
SUT MNCRL AB 4-0 PS2 18 (SUTURE) ×2 IMPLANT
SUT VIC AB 0 CT1 27 (SUTURE)
SUT VIC AB 0 CT1 27XBRD ANBCTR (SUTURE) IMPLANT
SUT VIC AB 1 CT1 27 (SUTURE) ×4
SUT VIC AB 1 CT1 27XBRD ANBCTR (SUTURE) ×2 IMPLANT
SUT VIC AB 2-0 CT1 27 (SUTURE) ×4
SUT VIC AB 2-0 CT1 TAPERPNT 27 (SUTURE) ×2 IMPLANT
SYR 50ML LL SCALE MARK (SYRINGE) ×2 IMPLANT
SYR CONTROL 10ML LL (SYRINGE) ×2 IMPLANT
TOWEL OR 17X24 6PK STRL BLUE (TOWEL DISPOSABLE) ×2 IMPLANT
TOWEL OR 17X26 10 PK STRL BLUE (TOWEL DISPOSABLE) ×2 IMPLANT
WATER STERILE IRR 1000ML POUR (IV SOLUTION) ×2 IMPLANT

## 2014-08-04 NOTE — Care Management Note (Signed)
Utilization Review completed   Jadian Karman,RN, BSN,CCM 

## 2014-08-04 NOTE — Progress Notes (Signed)
Dr. Lissa Hoard made aware of pt's BP.  He stated he would look at pt's chart before giving any orders.

## 2014-08-04 NOTE — Anesthesia Preprocedure Evaluation (Addendum)
Anesthesia Evaluation  Patient identified by MRN, date of birth, ID band Patient awake    Reviewed: Allergy & Precautions, NPO status , Patient's Chart, lab work & pertinent test results, reviewed documented beta blocker date and time   Airway Mallampati: I  TM Distance: >3 FB Neck ROM: Full    Dental  (+) Teeth Intact, Dental Advisory Given   Pulmonary          Cardiovascular hypertension, Pt. on medications and Pt. on home beta blockers     Neuro/Psych    GI/Hepatic hiatal hernia, GERD-  ,  Endo/Other    Renal/GU      Musculoskeletal  (+) Arthritis -,   Abdominal   Peds  Hematology   Anesthesia Other Findings   Reproductive/Obstetrics                            Anesthesia Physical Anesthesia Plan  ASA: III  Anesthesia Plan: General   Post-op Pain Management:    Induction: Intravenous  Airway Management Planned: Oral ETT  Additional Equipment: None  Intra-op Plan:   Post-operative Plan: Extubation in OR  Informed Consent: I have reviewed the patients History and Physical, chart, labs and discussed the procedure including the risks, benefits and alternatives for the proposed anesthesia with the patient or authorized representative who has indicated his/her understanding and acceptance.   Dental advisory given  Plan Discussed with: CRNA, Anesthesiologist and Surgeon  Anesthesia Plan Comments:        Anesthesia Quick Evaluation

## 2014-08-04 NOTE — Discharge Instructions (Signed)
INSTRUCTIONS AFTER JOINT REPLACEMENT   o Remove items at home which could result in a fall. This includes throw rugs or furniture in walking pathways o ICE to the affected joint every three hours while awake for 30 minutes at a time, for at least the first 3-5 days, and then as needed for pain and swelling.  Continue to use ice for pain and swelling. You may notice swelling that will progress down to the foot and ankle.  This is normal after surgery.  Elevate your leg when you are not up walking on it.   o Continue to use the breathing machine you got in the hospital (incentive spirometer) which will help keep your temperature down.  It is common for your temperature to cycle up and down following surgery, especially at night when you are not up moving around and exerting yourself.  The breathing machine keeps your lungs expanded and your temperature down.  BLOOD THINNER: USE LOVENOX INJECTIONS AS INSTRUCTED FOR A TOTAL OF 10 DAYS FOLLOWING SURGERY TO PREVENT BLOOD CLOTS  DIET:  As you were doing prior to hospitalization, we recommend a well-balanced diet.  DRESSING / WOUND CARE / SHOWERING  You may change your dressing 3-5 days after surgery.  Then change the dressing every day with sterile gauze.  Please use good hand washing techniques before changing the dressing.  Do not use any lotions or creams on the incision until instructed by your surgeon. and You may shower 3 days after surgery, but keep the wounds dry during showering.  You may use an occlusive plastic wrap (Press'n Seal for example), NO SOAKING/SUBMERGING IN THE BATHTUB.  If the bandage gets wet, change with a clean dry gauze.  If the incision gets wet, pat the wound dry with a clean towel.  ACTIVITY  o Increase activity slowly as tolerated, but follow the weight bearing instructions below.   o No driving for 6 weeks or until further direction given by your physician.  You cannot drive while taking narcotics.  o No lifting or carrying  greater than 10 lbs. until further directed by your surgeon. o Avoid periods of inactivity such as sitting longer than an hour when not asleep. This helps prevent blood clots.  o You may return to work once you are authorized by your doctor.     WEIGHT BEARING   Weight bearing as tolerated with assist device (walker, cane, etc) as directed, use it as long as suggested by your surgeon or therapist, typically at least 4-6 weeks.   EXERCISES  Results after joint replacement surgery are often greatly improved when you follow the exercise, range of motion and muscle strengthening exercises prescribed by your doctor. Safety measures are also important to protect the joint from further injury. Any time any of these exercises cause you to have increased pain or swelling, decrease what you are doing until you are comfortable again and then slowly increase them. If you have problems or questions, call your caregiver or physical therapist for advice.   Rehabilitation is important following a joint replacement. After just a few days of immobilization, the muscles of the leg can become weakened and shrink (atrophy).  These exercises are designed to build up the tone and strength of the thigh and leg muscles and to improve motion. Often times heat used for twenty to thirty minutes before working out will loosen up your tissues and help with improving the range of motion but do not use heat for the first two  weeks following surgery (sometimes heat can increase post-operative swelling).   These exercises can be done on a training (exercise) mat, on the floor, on a table or on a bed. Use whatever works the best and is most comfortable for you.    Use music or television while you are exercising so that the exercises are a pleasant break in your day. This will make your life better with the exercises acting as a break in your routine that you can look forward to.   Perform all exercises about fifteen times, three  times per day or as directed.  You should exercise both the operative leg and the other leg as well.  Exercises include:    Quad Sets - Tighten up the muscle on the front of the thigh (Quad) and hold for 5-10 seconds.    Straight Leg Raises - With your knee straight (if you were given a brace, keep it on), lift the leg to 60 degrees, hold for 3 seconds, and slowly lower the leg.  Perform this exercise against resistance later as your leg gets stronger.   Leg Slides: Lying on your back, slowly slide your foot toward your buttocks, bending your knee up off the floor (only go as far as is comfortable). Then slowly slide your foot back down until your leg is flat on the floor again.   Angel Wings: Lying on your back spread your legs to the side as far apart as you can without causing discomfort.   Hamstring Strength:  Lying on your back, push your heel against the floor with your leg straight by tightening up the muscles of your buttocks.  Repeat, but this time bend your knee to a comfortable angle, and push your heel against the floor.  You may put a pillow under the heel to make it more comfortable if necessary.   A rehabilitation program following joint replacement surgery can speed recovery and prevent re-injury in the future due to weakened muscles. Contact your doctor or a physical therapist for more information on knee rehabilitation.    CONSTIPATION  Constipation is defined medically as fewer than three stools per week and severe constipation as less than one stool per week.  Even if you have a regular bowel pattern at home, your normal regimen is likely to be disrupted due to multiple reasons following surgery.  Combination of anesthesia, postoperative narcotics, change in appetite and fluid intake all can affect your bowels.   YOU MUST use at least one of the following options; they are listed in order of increasing strength to get the job done.  They are all available over the counter,  and you may need to use some, POSSIBLY even all of these options:    Drink plenty of fluids (prune juice may be helpful) and high fiber foods Colace 100 mg by mouth twice a day  Senokot for constipation as directed and as needed Dulcolax (bisacodyl), take with full glass of water  Miralax (polyethylene glycol) once or twice a day as needed.  If you have tried all these things and are unable to have a bowel movement in the first 3-4 days after surgery call either your surgeon or your primary doctor.    If you experience loose stools or diarrhea, hold the medications until you stool forms back up.  If your symptoms do not get better within 1 week or if they get worse, check with your doctor.  If you experience "the worst abdominal pain ever" or  develop nausea or vomiting, please contact the office immediately for further recommendations for treatment.   ITCHING:  If you experience itching with your medications, try taking only a single pain pill, or even half a pain pill at a time.  You can also use Benadryl over the counter for itching or also to help with sleep.   TED HOSE STOCKINGS:  Use stockings on both legs until for at least 2 weeks or as directed by physician office. They may be removed at night for sleeping.  MEDICATIONS:  See your medication summary on the After Visit Summary that nursing will review with you.  You may have some home medications which will be placed on hold until you complete the course of blood thinner medication.  It is important for you to complete the blood thinner medication as prescribed.  PRECAUTIONS:  If you experience chest pain or shortness of breath - call 911 immediately for transfer to the hospital emergency department.   If you develop a fever greater that 101 F, purulent drainage from wound, increased redness or drainage from wound, foul odor from the wound/dressing, or calf pain - CONTACT YOUR SURGEON.                                                    FOLLOW-UP APPOINTMENTS:  If you do not already have a post-op appointment, please call the office for an appointment to be seen by your surgeon.  Guidelines for how soon to be seen are listed in your After Visit Summary, but are typically between 1-4 weeks after surgery.  OTHER INSTRUCTIONS:   Knee Replacement:  Do not place pillow under knee, focus on keeping the knee straight while resting. CPM instructions: 0-90 degrees, 2 hours in the morning, 2 hours in the afternoon, and 2 hours in the evening. Place foam block, curve side up under heel at all times except when in CPM or when walking.  DO NOT modify, tear, cut, or change the foam block in any way.  MAKE SURE YOU:   Understand these instructions.   Get help right away if you are not doing well or get worse.    Thank you for letting us be a part of your medical care team.  It is a privilege we respect greatly.  We hope these instructions will help you stay on track for a fast and full recovery!

## 2014-08-04 NOTE — Anesthesia Postprocedure Evaluation (Signed)
Anesthesia Post Note  Patient: Amanda Joyce  Procedure(s) Performed: Procedure(s) (LRB): RIGHT TOTAL KNEE ARTHROPLASTY (Right)  Anesthesia type: General  Patient location: PACU  Post pain: Pain level controlled  Post assessment: Post-op Vital signs reviewed  Last Vitals: BP 129/54 mmHg  Pulse 59  Temp(Src) 36.8 C  Resp 13  Wt 175 lb (79.379 kg)  SpO2 100%  Post vital signs: Reviewed  Level of consciousness: sedated  Complications: No apparent anesthesia complications

## 2014-08-04 NOTE — Progress Notes (Signed)
Orthopedic Tech Progress Note Patient Details:  Amanda Joyce 1940/06/01 [id number]  CPM Right Knee CPM Right Knee: On Right Knee Flexion (Degrees): 90 Right Knee Extension (Degrees): 0 Additional Comments: aplied ohf to bed and footsie roll  Ortho Devices Ortho Device/Splint Location: footsie roll   Braulio Bosch 08/04/2014, 3:29 PM

## 2014-08-04 NOTE — Discharge Summary (Addendum)
Patient ID: Amanda Joyce MRN: [id number] DOB/AGE: 74/03/1941 74 y.o.  Admit date: 08/04/2014 Discharge date: 08/06/2014  Admission Diagnoses:  Active Problems:   DJD (degenerative joint disease) of knee   Discharge Diagnoses:  Same  Past Medical History  Diagnosis Date  . Hypertension   . Depression   . Gout   . Temporomandibular joint disorders, unspecified   . Personal history of malignant neoplasm of breast   . Breast cancer     Left   . Irritable bowel syndrome   . Nontoxic uninodular goiter   . Hyperlipemia   . Hiatal hernia   . GERD (gastroesophageal reflux disease)   . History of stress test >10 yrs. ago    in [place], [place] - no need for follow up  . Anxiety   . Arthritis     neck, shoulders, back, knees   . Osteoarthrosis, unspecified whether generalized or localized, unspecified site     Surgeries: Procedure(s): RIGHT TOTAL KNEE ARTHROPLASTY on 08/04/2014   Consultants:    Discharged Condition: Improved  Hospital Course: Amanda Joyce is an 74 y.o. female who was admitted 08/04/2014 for operative treatment of primary localized osteoarthritis right knee. Patient has severe unremitting pain that affects sleep, daily activities, and work/hobbies. After pre-op clearance the patient was taken to the operating room on 08/04/2014 and underwent  Procedure(s): RIGHT TOTAL KNEE ARTHROPLASTY.  Patient with a pre-op Hb of 14.7 developed ABLA on pod #1 with a Hb of 11.4.  Patient is mildly symptomatic but we will continue to follow.  Patient was given perioperative antibiotics:      Anti-infectives    Start     Dose/Rate Route Frequency Ordered Stop   08/04/14 1700  clindamycin (CLEOCIN) IVPB 600 mg     600 mg 100 mL/hr over 30 Minutes Intravenous Every 6 hours 08/04/14 1647 08/04/14 2238   08/04/14 0600  clindamycin (CLEOCIN) IVPB 900 mg     900 mg 100 mL/hr over 30 Minutes Intravenous On call to O.R. 08/03/14 1310 08/04/14 1245       Patient was given sequential  compression devices, early ambulation, and chemoprophylaxis to prevent DVT.  Patient benefited maximally from hospital stay and there were no complications.    Recent vital signs:  Patient Vitals for the past 24 hrs:  BP Temp Temp src Pulse Resp SpO2  08/06/14 0605 (!) 134/56 mmHg 98.4 F (36.9 C) Oral 70 16 94 %  08/06/14 0400 - - - - 17 97 %  08/06/14 0000 - - - - 17 (!) 17 %  08/05/14 2147 (!) 140/54 mmHg 98.6 F (37 C) Oral 71 17 94 %  08/05/14 2000 - - - - 17 95 %  08/05/14 1247 (!) 142/62 mmHg 98.8 F (37.1 C) Oral 66 18 94 %     Recent laboratory studies:   Recent Labs  08/04/14 1940 08/05/14 0715  WBC 15.1* 11.3*  HGB 12.0 11.4*  HCT 38.2 33.7*  PLT 275 249  NA  --  137  K  --  4.4  CL  --  105  CO2  --  22  BUN  --  13  CREATININE 0.81 0.64  GLUCOSE  --  109*  CALCIUM  --  9.2     Discharge Medications:     Medication List    STOP taking these medications        aspirin 81 MG tablet     chlorpheniramine-HYDROcodone 10-8 MG/5ML Lqcr  Commonly known as:  TUSSIONEX PENNKINETIC ER     ibuprofen 800 MG tablet  Commonly known as:  ADVIL,MOTRIN     tiZANidine 4 MG tablet  Commonly known as:  ZANAFLEX      TAKE these medications        ALPRAZolam 0.25 MG tablet  Commonly known as:  XANAX  Take 1 tablet (0.25 mg total) by mouth 3 (three) times daily as needed for anxiety.     atenolol 50 MG tablet  Commonly known as:  TENORMIN  Take 1 tablet (50 mg total) by mouth 2 (two) times daily.     Azelastine-Fluticasone 137-50 MCG/ACT Susp  Place 1 spray into the nose 2 (two) times daily as needed.     azithromycin 250 MG tablet  Commonly known as:  ZITHROMAX  Take 2 tablets on Day 1.  Then take 1 tablet daily.     b complex vitamins capsule  Take 1 capsule by mouth daily.     Biotin 5000 MCG Caps  Take 5,000 mcg by mouth daily.     bisacodyl 5 MG EC tablet  Commonly known as:  DULCOLAX  Take 1 tablet (5 mg total) by mouth daily as needed for  moderate constipation.     desvenlafaxine 100 MG 24 hr tablet  Commonly known as:  PRISTIQ  Take 100 mg by mouth daily.     diltiazem 240 MG 24 hr capsule  Commonly known as:  CARDIZEM CD  Take 1 capsule (240 mg total) by mouth daily.     enoxaparin 30 MG/0.3ML injection  Commonly known as:  LOVENOX  Inject 0.3 mLs (30 mg total) into the skin every 12 (twelve) hours.     ipratropium 0.06 % nasal spray  Commonly known as:  ATROVENT  Place 2 sprays into the nose 3 (three) times daily as needed.     methocarbamol 500 MG tablet  Commonly known as:  ROBAXIN  Take 1 tablet (500 mg total) by mouth 4 (four) times daily.     ondansetron 4 MG tablet  Commonly known as:  ZOFRAN  Take 1 tablet (4 mg total) by mouth every 8 (eight) hours as needed for nausea or vomiting.     oxyCODONE-acetaminophen 5-325 MG per tablet  Commonly known as:  ROXICET  Take 1-2 tablets by mouth every 4 (four) hours as needed.     pravastatin 20 MG tablet  Commonly known as:  PRAVACHOL  Take 1 tablet (20 mg total) by mouth daily.     quinapril 40 MG tablet  Commonly known as:  ACCUPRIL  Take 1 tablet (40 mg total) by mouth daily.     triamcinolone cream 0.1 %  Commonly known as:  KENALOG  Apply 1 application topically 2 (two) times daily.     Vitamin D3 2000 UNITS capsule  Take 4,000 Units by mouth daily.     zolpidem 10 MG tablet  Commonly known as:  AMBIEN  Take 10 mg by mouth at bedtime as needed for sleep.        Diagnostic Studies: Dg Knee Right Port  08/04/2014   CLINICAL DATA:  Postop right total knee arthroplasty. Initial encounter.  EXAM: PORTABLE RIGHT KNEE - 1-2 VIEW  COMPARISON:  05/19/2009 radiographs.  FINDINGS: The patient has undergone interval right total knee arthroplasty. The hardware appears well positioned. A surgical drain is in place. A small amount of air is present within the joint and soft tissues surrounding the knee. There is no evidence of acute fracture  or subluxation.   IMPRESSION: No demonstrated complication related to right total knee arthroplasty.   Electronically Signed   By: Richardean Sale M.D.   On: 08/04/2014 15:27    Disposition: 01-Home or Self Care  Discharge Instructions    CPM    Complete by:  As directed   Continuous passive motion machine (CPM):      Use the CPM from 0- to 60 for 6 hours per day.      You may increase by 10 per day.  You may break it up into 2 or 3 sessions per day.      Use CPM for 2-3 weeks or until you are told to stop.     Call MD / Call 911    Complete by:  As directed   If you experience chest pain or shortness of breath, CALL 911 and be transported to the hospital emergency room.  If you develope a fever above 101 F, pus (white drainage) or increased drainage or redness at the wound, or calf pain, call your surgeon's office.     Change dressing    Complete by:  As directed   Change dressing on Saturday, then change the dressing daily with sterile 4 x 4 inch gauze dressing and apply TED hose.  You may clean the incision with alcohol prior to redressing.     Constipation Prevention    Complete by:  As directed   Drink plenty of fluids.  Prune juice may be helpful.  You may use a stool softener, such as Colace (over the counter) 100 mg twice a day.  Use MiraLax (over the counter) for constipation as needed.     Diet - low sodium heart healthy    Complete by:  As directed      Discharge instructions    Complete by:  As directed   INSTRUCTIONS AFTER JOINT REPLACEMENT   Remove items at home which could result in a fall. This includes throw rugs or furniture in walking pathways ICE to the affected joint every three hours while awake for 30 minutes at a time, for at least the first 3-5 days, and then as needed for pain and swelling.  Continue to use ice for pain and swelling. You may notice swelling that will progress down to the foot and ankle.  This is normal after surgery.  Elevate your leg when you are not up walking on  it.   Continue to use the breathing machine you got in the hospital (incentive spirometer) which will help keep your temperature down.  It is common for your temperature to cycle up and down following surgery, especially at night when you are not up moving around and exerting yourself.  The breathing machine keeps your lungs expanded and your temperature down.  BLOOD THINNER: USE LOVENOX INJECTIONS AS INSTRUCTED FOR A TOTAL OF 10 DAYS FOLLOWING SURGERY TO PREVENT BLOOD CLOTS  DIET:  As you were doing prior to hospitalization, we recommend a well-balanced diet.  DRESSING / WOUND CARE / SHOWERING  You may change your dressing 3-5 days after surgery.  Then change the dressing every day with sterile gauze.  Please use good hand washing techniques before changing the dressing.  Do not use any lotions or creams on the incision until instructed by your surgeon. and You may shower 3 days after surgery, but keep the wounds dry during showering.  You may use an occlusive plastic wrap (Press'n Seal for example), NO SOAKING/SUBMERGING IN THE BATHTUB.  If the bandage gets wet, change with a clean dry gauze.  If the incision gets wet, pat the wound dry with a clean towel.  ACTIVITY  Increase activity slowly as tolerated, but follow the weight bearing instructions below.   No driving for 6 weeks or until further direction given by your physician.  You cannot drive while taking narcotics.  No lifting or carrying greater than 10 lbs. until further directed by your surgeon. Avoid periods of inactivity such as sitting longer than an hour when not asleep. This helps prevent blood clots.  You may return to work once you are authorized by your doctor.     WEIGHT BEARING   Weight bearing as tolerated with assist device (walker, cane, etc) as directed, use it as long as suggested by your surgeon or therapist, typically at least 4-6 weeks.   EXERCISES  Results after joint replacement surgery are often greatly  improved when you follow the exercise, range of motion and muscle strengthening exercises prescribed by your doctor. Safety measures are also important to protect the joint from further injury. Any time any of these exercises cause you to have increased pain or swelling, decrease what you are doing until you are comfortable again and then slowly increase them. If you have problems or questions, call your caregiver or physical therapist for advice.   Rehabilitation is important following a joint replacement. After just a few days of immobilization, the muscles of the leg can become weakened and shrink (atrophy).  These exercises are designed to build up the tone and strength of the thigh and leg muscles and to improve motion. Often times heat used for twenty to thirty minutes before working out will loosen up your tissues and help with improving the range of motion but do not use heat for the first two weeks following surgery (sometimes heat can increase post-operative swelling).   These exercises can be done on a training (exercise) mat, on the floor, on a table or on a bed. Use whatever works the best and is most comfortable for you.    Use music or television while you are exercising so that the exercises are a pleasant break in your day. This will make your life better with the exercises acting as a break in your routine that you can look forward to.   Perform all exercises about fifteen times, three times per day or as directed.  You should exercise both the operative leg and the other leg as well.  Exercises include:   Quad Sets - Tighten up the muscle on the front of the thigh (Quad) and hold for 5-10 seconds.   Straight Leg Raises - With your knee straight (if you were given a brace, keep it on), lift the leg to 60 degrees, hold for 3 seconds, and slowly lower the leg.  Perform this exercise against resistance later as your leg gets stronger.  Leg Slides: Lying on your back, slowly slide your foot  toward your buttocks, bending your knee up off the floor (only go as far as is comfortable). Then slowly slide your foot back down until your leg is flat on the floor again.  Angel Wings: Lying on your back spread your legs to the side as far apart as you can without causing discomfort.  Hamstring Strength:  Lying on your back, push your heel against the floor with your leg straight by tightening up the muscles of your buttocks.  Repeat, but this time bend your knee to  a comfortable angle, and push your heel against the floor.  You may put a pillow under the heel to make it more comfortable if necessary.   A rehabilitation program following joint replacement surgery can speed recovery and prevent re-injury in the future due to weakened muscles. Contact your doctor or a physical therapist for more information on knee rehabilitation.    CONSTIPATION  Constipation is defined medically as fewer than three stools per week and severe constipation as less than one stool per week.  Even if you have a regular bowel pattern at home, your normal regimen is likely to be disrupted due to multiple reasons following surgery.  Combination of anesthesia, postoperative narcotics, change in appetite and fluid intake all can affect your bowels.   YOU MUST use at least one of the following options; they are listed in order of increasing strength to get the job done.  They are all available over the counter, and you may need to use some, POSSIBLY even all of these options:    Drink plenty of fluids (prune juice may be helpful) and high fiber foods Colace 100 mg by mouth twice a day  Senokot for constipation as directed and as needed Dulcolax (bisacodyl), take with full glass of water  Miralax (polyethylene glycol) once or twice a day as needed.  If you have tried all these things and are unable to have a bowel movement in the first 3-4 days after surgery call either your surgeon or your primary doctor.    If you  experience loose stools or diarrhea, hold the medications until you stool forms back up.  If your symptoms do not get better within 1 week or if they get worse, check with your doctor.  If you experience "the worst abdominal pain ever" or develop nausea or vomiting, please contact the office immediately for further recommendations for treatment.   ITCHING:  If you experience itching with your medications, try taking only a single pain pill, or even half a pain pill at a time.  You can also use Benadryl over the counter for itching or also to help with sleep.   TED HOSE STOCKINGS:  Use stockings on both legs until for at least 2 weeks or as directed by physician office. They may be removed at night for sleeping.  MEDICATIONS:  See your medication summary on the "After Visit Summary" that nursing will review with you.  You may have some home medications which will be placed on hold until you complete the course of blood thinner medication.  It is important for you to complete the blood thinner medication as prescribed.  PRECAUTIONS:  If you experience chest pain or shortness of breath - call 911 immediately for transfer to the hospital emergency department.   If you develop a fever greater that 101 F, purulent drainage from wound, increased redness or drainage from wound, foul odor from the wound/dressing, or calf pain - CONTACT YOUR SURGEON.                                                   FOLLOW-UP APPOINTMENTS:  If you do not already have a post-op appointment, please call the office for an appointment to be seen by your surgeon.  Guidelines for how soon to be seen are listed in your "After Visit Summary", but are typically  between 1-4 weeks after surgery.  OTHER INSTRUCTIONS:   Knee Replacement:  Do not place pillow under knee, focus on keeping the knee straight while resting. CPM instructions: 0-90 degrees, 2 hours in the morning, 2 hours in the afternoon, and 2 hours in the evening. Place foam  block, curve side up under heel at all times except when in CPM or when walking.  DO NOT modify, tear, cut, or change the foam block in any way.  MAKE SURE YOU:  Understand these instructions.  Get help right away if you are not doing well or get worse.    Thank you for letting us be a part of your medical care team.  It is a privilege we respect greatly.  We hope these instructions will help you stay on track for a fast and full recovery!     Do not put a pillow under the knee. Place it under the heel.    Complete by:  As directed   Place gray foam under operative heel when in bed or in a chair to work on extension     Increase activity slowly as tolerated    Complete by:  As directed      TED hose    Complete by:  As directed   Use stockings (TED hose) for 2 weeks on both leg(s).  You may remove them at night for sleeping.           Follow-up Information    Follow up with [facility], MD. Schedule an appointment as soon as possible for a visit in 2 weeks.   Specialty:  Orthopedic Surgery   Contact information:   [place] [address] [phone]        Signed: Fannie Knee 08/06/2014, 7:13 AM

## 2014-08-04 NOTE — Transfer of Care (Signed)
Immediate Anesthesia Transfer of Care Note  Patient: Amanda Joyce  Procedure(s) Performed: Procedure(s): RIGHT TOTAL KNEE ARTHROPLASTY (Right)  Patient Location: PACU  Anesthesia Type:General  Level of Consciousness: awake and oriented  Airway & Oxygen Therapy: Patient Spontanous Breathing and Patient connected to nasal cannula oxygen  Post-op Assessment: Report given to RN, Post -op Vital signs reviewed and stable and Patient moving all extremities  Post vital signs: Reviewed  Last Vitals:  Filed Vitals:   08/04/14 1123  BP: 216/71  Pulse:   Temp:   Resp:     Complications: No apparent anesthesia complications

## 2014-08-04 NOTE — Anesthesia Procedure Notes (Signed)
Procedure Name: Intubation Date/Time: 08/04/2014 12:54 PM Performed by: Melina Copa, Chelisa Hennen R Pre-anesthesia Checklist: Patient identified, Emergency Drugs available, Suction available, Patient being monitored and Timeout performed Patient Re-evaluated:Patient Re-evaluated prior to inductionOxygen Delivery Method: Circle system utilized Preoxygenation: Pre-oxygenation with 100% oxygen Intubation Type: IV induction Ventilation: Mask ventilation without difficulty Laryngoscope Size: Mac and 3 Grade View: Grade II Tube type: Oral Tube size: 7.5 mm Number of attempts: 1 Airway Equipment and Method: Stylet Placement Confirmation: ETT inserted through vocal cords under direct vision,  positive ETCO2 and breath sounds checked- equal and bilateral Secured at: 21 cm Tube secured with: Tape Dental Injury: Teeth and Oropharynx as per pre-operative assessment

## 2014-08-05 ENCOUNTER — Encounter (HOSPITAL_COMMUNITY): Payer: Self-pay | Admitting: Orthopedic Surgery

## 2014-08-05 LAB — BASIC METABOLIC PANEL
Anion gap: 10 (ref 5–15)
BUN: 13 mg/dL (ref 6–23)
CALCIUM: 9.2 mg/dL (ref 8.4–10.5)
CO2: 22 mmol/L (ref 19–32)
Chloride: 105 mmol/L (ref 96–112)
Creatinine, Ser: 0.64 mg/dL (ref 0.50–1.10)
GFR calc Af Amer: >90 mL/min (ref >90)
GFR, EST NON AFRICAN AMERICAN: 86 mL/min — AB (ref >90)
GLUCOSE: 109 mg/dL — AB (ref 70–99)
Potassium: 4.4 mmol/L (ref 3.5–5.1)
Sodium: 137 mmol/L (ref 135–145)

## 2014-08-05 LAB — CBC
HCT: 33.7 % — ABNORMAL LOW (ref 36.0–46.0)
HEMOGLOBIN: 11.4 g/dL — AB (ref 12.0–15.0)
MCH: 29.8 pg (ref 26.0–34.0)
MCHC: 33.8 g/dL (ref 30.0–36.0)
MCV: 88 fL (ref 78.0–100.0)
Platelets: 249 10*3/uL (ref 150–400)
RBC: 3.83 MIL/uL — AB (ref 3.87–5.11)
RDW: 13.7 % (ref 11.5–15.5)
WBC: 11.3 10*3/uL — ABNORMAL HIGH (ref 4.0–10.5)

## 2014-08-05 NOTE — Op Note (Signed)
[name], Amanda Joyce NO.:  [id number]  MEDICAL RECORD NO.:  [id number]  LOCATION:  5N12C                        FACILITY:  [facility]  PHYSICIAN:  Ninetta Lights, M.D. DATE OF BIRTH:  1940/11/07  DATE OF PROCEDURE:  08/04/2014 DATE OF DISCHARGE:                              OPERATIVE REPORT   PREOPERATIVE DIAGNOSIS:  Right knee primary localized end-stage arthritis, varus alignment.  POSTOPERATIVE DIAGNOSIS:  Right knee primary localized end-stage arthritis, varus alignment.  PROCEDURE:  Modified minimally invasive right total knee replacement utilizing Stryker prosthesis.  Cemented pegged CR femoral component. Cemented #5 tibial component, 9 mm CS insert.  Femoral component was also #5.  Cemented resurfacing 35-mm patellar component.  Soft tissue balancing.  SURGEON:  Ninetta Lights, M.D.  ASSISTANT:  Elmyra Ricks, PA, present throughout the entire case and necessary for timely completion of procedure.  ANESTHESIA:  General.  BLOOD LOSS:  Minimal.  SPECIMENS:  None.  CULTURES:  None.  COMPLICATIONS:  None.  DRESSINGS:  Soft compressive knee immobilizer.  DRAINS:  Hemovac x1.  TOURNIQUET TIME:  1 hour.  PROCEDURE:  The patient was brought to the operating room, placed on the operating table in a supine position.  After an adequate anesthesia had been obtained, tourniquet applied.  Prepped and draped in usual sterile fashion.  Exsanguinated with elevation of Esmarch.  Tourniquet inflated to 350 mmHg.  Straight incision above the patella down to the tibial tubercle.  Skin and subcutaneous tissue divided.  Medial arthrotomy, vastus splitting, preserving quad tendon.  Medial capsule released.  An 8 mm resection, distal femur 5 degrees of valgus.  Using epicondylar axis, the femur was sized, cut, and fitted for a pegged cruciate retaining #5 component.  Proximal tibial resection with extramedullary guide.  Sized with #5 component.  Rotation  set with trials.  Patella exposed, posterior 10 mm removed.  Drilled, sized, and fitted for a 35 mm component.  Trials put in place.  With the 9 mm insert, I was pleased with biomechanical axis, motion, stability, and patellar tracking.  All trials removed.  Copious irrigation with pulse irrigating device. Cement prepared and placed on all components, firmly seated. Polyethylene attached to tibia, knee reduced.  Patella held with a clamp.  Once cement hardened, the wound was irrigated again.  Soft tissues injected with Exparel.  Hemovac placed and brought out through a separate stab wound.  Arthrotomy closed with #1 Vicryl.  Skin and subcutaneous tissue with subcutaneous and subcuticular closure.  Margins were injected with Marcaine.  Sterile compressive dressing applied. Tourniquet deflated and removed.  Knee immobilizer applied.  Anesthesia reversed.  Brought to the recovery room.  Tolerated the surgery well. No complications.     Ninetta Lights, M.D.     DFM/MEDQ  D:  08/04/2014  T:  08/05/2014  Job:  [id number]

## 2014-08-05 NOTE — Evaluation (Signed)
Physical Therapy Evaluation Patient Details Name: Amanda Joyce MRN: [id number] DOB: 1940-12-30 Today's Date: 08/05/2014   History of Present Illness  74 yo female s/p R TKA PMH: breast CA, arthritis, htn , gout  Clinical Impression  Pt is s/p R TKA POD#1 resulting in the deficits listed below (see PT Problem List). Pt will benefit from skilled PT to increase their independence and safety with mobility to allow discharge to the venue listed below. Pt greatly limited due to pain. Pt verbally and visually upset about pain at this time. Pt given max encouragement to stay positive with therapies. Recommend SNF at this time pending rehab progress.      Follow Up Recommendations SNF    Equipment Recommendations  None recommended by PT    Recommendations for Other Services       Precautions / Restrictions Precautions Precautions: Knee Required Braces or Orthoses: Knee Immobilizer - Right Knee Immobilizer - Right: On at all times;On except when in CPM Restrictions Weight Bearing Restrictions: Yes RLE Weight Bearing: Weight bearing as tolerated      Mobility  Bed Mobility Overal bed mobility: Needs Assistance Bed Mobility: Supine to Sit     Supine to sit: Min assist;HOB elevated     General bed mobility comments: (A) to bring rt LE off EOB: incr time due to pain; cues for hand placement  Transfers Overall transfer level: Needs assistance Equipment used: Rolling walker (2 wheeled) Transfers: Sit to/from [facility] Sit to Stand: Mod assist;From elevated surface Stand pivot transfers: Mod assist;From elevated surface       General transfer comment: pt requiring incr time due to pain; max encouragement for participation; visually upset due to pain; cues for sequencing with pivotal steps to chair  Ambulation/Gait             General Gait Details: unable due to pain; pivotal steps only  Stairs            Wheelchair Mobility    Modified Rankin  (Stroke Patients Only)       Balance Overall balance assessment: Needs assistance Sitting-balance support: Feet supported;No upper extremity supported Sitting balance-Leahy Scale: Fair Sitting balance - Comments: sat EOB ~6 min; pt initially dizzy; BP 130/60   Standing balance support: During functional activity;Bilateral upper extremity supported Standing balance-Leahy Scale: Poor Standing balance comment: RW to balance                              Pertinent Vitals/Pain Pain Assessment: 0-10 Pain Score: 10-Worst pain ever Pain Location: Rt knee Pain Descriptors / Indicators: Constant;Aching Pain Intervention(s): Monitored during session;Limited activity within patient's tolerance;Patient requesting pain meds-RN notified;Repositioned    Home Living Family/patient expects to be discharged to:: Private residence Living Arrangements: Children Available Help at Discharge: Family Type of Home: House Home Access: Stairs to enter Entrance Stairs-Rails: None Entrance Stairs-Number of Steps: 2 Home Layout: Two level;Able to live on main level with bedroom/bathroom Home Equipment: Gilford Rile - 2 wheels;Shower seat;Hand held shower head;Bedside commode Additional Comments: lives with son    Prior Function Level of Independence: Independent               Hand Dominance   Dominant Hand: Right    Extremity/Trunk Assessment   Upper Extremity Assessment: Defer to OT evaluation           Lower Extremity Assessment: RLE deficits/detail RLE Deficits / Details: unable to assess flexion due to  pain    Cervical / Trunk Assessment: Normal  Communication   Communication: No difficulties  Cognition Arousal/Alertness: Awake/alert Behavior During Therapy: WFL for tasks assessed/performed Overall Cognitive Status: Within Functional Limits for tasks assessed                      General Comments General comments (skin integrity, edema, etc.): encouraged OOB  activity with nursing    Exercises Total Joint Exercises Ankle Circles/Pumps: AROM;Both;10 reps      Assessment/Plan    PT Assessment Patient needs continued PT services  PT Diagnosis Difficulty walking;Generalized weakness;Acute pain   PT Problem List Decreased strength;Decreased range of motion;Decreased activity tolerance;Decreased balance;Decreased mobility;Decreased knowledge of use of DME;Pain;Decreased knowledge of precautions  PT Treatment Interventions DME instruction;Gait training;Functional mobility training;Stair training;Therapeutic activities;Therapeutic exercise;Balance training;Neuromuscular re-education;Patient/family education   PT Goals (Current goals can be found in the Care Plan section) Acute Rehab PT Goals Patient Stated Goal: to not have so much pain PT Goal Formulation: With patient Time For Goal Achievement: 08/12/14 Potential to Achieve Goals: Good    Frequency 7X/week   Barriers to discharge Inaccessible home environment      Co-evaluation               End of Session Equipment Utilized During Treatment: Gait belt Activity Tolerance: Patient limited by pain Patient left: in chair;with call bell/phone within reach;with nursing/sitter in room Nurse Communication: Mobility status;Patient requests pain meds;Precautions         Time: 2336-1224 PT Time Calculation (min) (ACUTE ONLY): 25 min   Charges:   PT Evaluation $Initial PT Evaluation Tier I: 1 Procedure PT Treatments $Therapeutic Activity: 8-22 mins   PT G CodesGustavus Bryant PT [phone] 08/05/2014, 11:11 AM

## 2014-08-05 NOTE — Clinical Social Work Note (Signed)
CSW received referral for SNF placement. CSW met with patient at bedside. Full assessment to follow.  Patient agreeable to SNF search in [facility], but would prefer to discharge home with assistance from patient's son Ronalee Belts). CSW to continue to follow and assist with discharge planning needs.  FL2 on chart for MD signature.  Lubertha Sayres, [place] Cell: [phone]       Fax: [phone] Clinical Social Work: Orthopedics [phone]) and Surgical [phone])

## 2014-08-05 NOTE — Evaluation (Signed)
Occupational Therapy Evaluation Patient Details Name: Amanda Joyce MRN: [id number] DOB: 02/11/1941 Today's Date: 08/05/2014    History of Present Illness 74 yo female s/p R TKA PMH: breast CA, arthritis, htn , gout   Clinical Impression   Patient is s/p R TKA surgery resulting in functional limitations due to the deficits listed below (see OT problem list). Pt very limited by pain and will need higher level of care if she does not progress with therapy. Pt educated on recommendation for SNF and how if she progresses it will be changed to [place]. Pt very tearful entire session due to pain and verbalized disappointment with surgery. Pt encouraged to keep trying and that it was only POD 1 Patient will benefit from skilled OT acutely to increase independence and safety with ADLS to allow discharge SNF.     Follow Up Recommendations  SNF (pending progress)    Equipment Recommendations  None recommended by OT    Recommendations for Other Services       Precautions / Restrictions Precautions Precautions: Knee Required Braces or Orthoses: Knee Immobilizer - Right Knee Immobilizer - Right: On at all times;On except when in CPM Restrictions Weight Bearing Restrictions: Yes RLE Weight Bearing: Weight bearing as tolerated      Mobility Bed Mobility               General bed mobility comments: in chair on arrival  Transfers                 General transfer comment: refusing    Balance                                            ADL Overall ADL's : Needs assistance/impaired Eating/Feeding: Independent   Grooming: Wash/dry face;Wash/dry hands;Modified independent;Sitting Grooming Details (indicate cue type and reason): unable to tolerate standing or attempting     Lower Body Bathing: Maximal assistance;Sitting/lateral leans       Lower Body Dressing: Maximal assistance;Sitting/lateral leans     Toilet Transfer Details (indicate cue type  and reason): reports completing task with techs prior in the day           General ADL Comments: Pt very limited by pain and reports 10 out 10. pt crying throughout session. pt reports " i wish i never did this. Its aweful" Pt educate on LB dressing and bathing with AE present. Next session to focus on mobility for bed , bathroom and attempt AE. Pt educated on all options for purchase of aE     Vision     Perception     Praxis      Pertinent Vitals/Pain Pain Assessment: 0-10 Pain Score: 10-Worst pain ever Pain Location: R knee Pain Descriptors / Indicators: Sharp;Shooting Pain Intervention(s): Premedicated before session     Hand Dominance Right   Extremity/Trunk Assessment Upper Extremity Assessment Upper Extremity Assessment: Overall WFL for tasks assessed   Lower Extremity Assessment Lower Extremity Assessment: Defer to PT evaluation   Cervical / Trunk Assessment Cervical / Trunk Assessment: Normal   Communication Communication Communication: No difficulties   Cognition Arousal/Alertness: Awake/alert Behavior During Therapy: WFL for tasks assessed/performed Overall Cognitive Status: Within Functional Limits for tasks assessed                     General Comments       Exercises  Shoulder Instructions      Home Living Family/patient expects to be discharged to:: Private residence Living Arrangements: Children Available Help at Discharge: Family Type of Home: House Home Access: Stairs to enter [employer] of Steps: 2 Entrance Stairs-Rails: None Home Layout: Two level;Able to live on main level with bedroom/bathroom     Bathroom Shower/Tub: Tub/shower unit;Curtain (son has walk in shower)   Biochemist, clinical: Standard     Home Equipment: Environmental consultant - 2 wheels;Shower seat;Hand held shower head;Bedside commode          Prior Functioning/Environment Level of Independence: Independent             OT Diagnosis: Generalized  weakness;Acute pain   OT Problem List: Decreased strength;Decreased knowledge of precautions;Pain;Decreased knowledge of use of DME or AE;Decreased safety awareness;Decreased range of motion;Decreased activity tolerance;Impaired balance (sitting and/or standing)   OT Treatment/Interventions: Self-care/ADL training;Therapeutic exercise;DME and/or AE instruction;Therapeutic activities;Patient/family education;Balance training    OT Goals(Current goals can be found in the care plan section) Acute Rehab OT Goals Patient Stated Goal: to go home to her dog "powder" OT Goal Formulation: With patient/family Time For Goal Achievement: 08/19/14 Potential to Achieve Goals: Good  OT Frequency: Min 2X/week   Barriers to D/C:            Co-evaluation              End of Session CPM Right Knee CPM Right Knee: Off Nurse Communication: Mobility status;Precautions  Activity Tolerance: Patient limited by pain Patient left: in chair;with call bell/phone within reach   Time: 0912-0940 OT Time Calculation (min): 28 min Charges:  OT General Charges $OT Visit: 1 Procedure OT Evaluation $Initial OT Evaluation Tier I: 1 Procedure G-Codes:    Peri Maris 2014-08-13, 9:54 AM Pager: [phone]

## 2014-08-05 NOTE — Progress Notes (Signed)
Subjective: 1 Day Post-Op Procedure(s) (LRB): RIGHT TOTAL KNEE ARTHROPLASTY (Right) Patient reports pain as moderate.  Patient with moderate nausea yesterday.  This has appeared to have resolved this am.  No vomiting.  Patient also reports lightheadedness/dizziness upon standing.  No chest pain/sob.  Negative flatus/bm.    Objective: Vital signs in last 24 hours: Temp:  [98 F (36.7 C)-98.9 F (37.2 C)] 98.9 F (37.2 C) (03/31 0602) Pulse Rate:  [52-75] 75 (03/31 0602) Resp:  [10-20] 17 (03/31 0602) BP: (95-217)/(44-108) 153/59 mmHg (03/31 0602) SpO2:  [95 %-100 %] 95 % (03/31 0602) Weight:  [79.379 kg (175 lb)] 79.379 kg (175 lb) (03/30 1050)  Intake/Output from previous day: 03/30 0701 - 03/31 0700 In: 2486.7 [P.O.:240; I.V.:2246.7] Out: 1175 [Urine:500; Drains:675] Intake/Output this shift:     Recent Labs  08/04/14 1940  HGB 12.0    Recent Labs  08/04/14 1940  WBC 15.1*  RBC 4.23  HCT 38.2  PLT 275    Recent Labs  08/04/14 1940  CREATININE 0.81   No results for input(s): LABPT, INR in the last 72 hours.  Neurologically intact Neurovascular intact Sensation intact distally Intact pulses distally Dorsiflexion/Plantar flexion intact Compartment soft  Negative homans bilaterally hemovac drain pulled by me today  Assessment/Plan: 1 Day Post-Op Procedure(s) (LRB): RIGHT TOTAL KNEE ARTHROPLASTY (Right) Advance diet Up with therapy Plan for discharge tomorrow to home with hhpt WBAT RLE Dry dressing change prn  Fannie Knee 08/05/2014, 7:17 AM

## 2014-08-05 NOTE — Clinical Social Work Psychosocial (Signed)
Clinical Social Work Department BRIEF PSYCHOSOCIAL ASSESSMENT 08/05/2014  Patient:  Amanda Joyce, Amanda Joyce     Account Number:  [id number]     Admit date:  08/04/2014  Clinical Social Worker:  Delrae Sawyers  Date/Time:  08/05/2014 12:30 PM  Referred by:  Physician  Date Referred:  08/05/2014 Referred for  SNF Placement   Other Referral:   none.   Interview type:  Patient Other interview type:   none.    PSYCHOSOCIAL DATA Living Status:  FAMILY Admitted from facility:   Level of care:   Primary support name:  Asencion Noble Primary support relationship to patient:  CHILD, ADULT Degree of support available:   Strong support system.    CURRENT CONCERNS Current Concerns  Post-Acute Placement   Other Concerns:   none.    SOCIAL WORK ASSESSMENT / PLAN CSW received referral for possible SNF placement at time of discharge. CSW met with patient at bedside to discuss discharge disposition.    Patient stated patient aware of PT recommendation and would prefer to discharge home with assistance from patient's son. Patient informed CSW patient agreeable to SNF search in [facility], but first preference is to discharge home. Per patient, patient's son lives with patient and has flexible work hours. Patient presents with pleasant mood and stated feeling hopeful regarding patient's recovery.    CSW to continue to follow and assist with discharge planning needs.   Assessment/plan status:  Psychosocial Support/Ongoing Assessment of Needs Other assessment/ plan:   none.   Information/referral to community resources:   [facility].  FL2 on patient's chart for MD signature.    PATIENT'S/FAMILY'S RESPONSE TO PLAN OF CARE: Patient understanding and agreeable to CSW plan of care. Patient expressed no further questions or concerns at this time.       Lubertha Sayres, [place] Cell: [phone]       Fax: [phone] Clinical Social Work: Orthopedics [phone]) and Surgical  [phone])

## 2014-08-05 NOTE — Progress Notes (Signed)
Physical Therapy Treatment Patient Details Name: Amanda Joyce MRN: [id number] DOB: November 14, 1940 Today's Date: 08/05/2014    History of Present Illness 74 yo female s/p R TKA PMH: breast CA, arthritis, htn , gout    PT Comments    Pt continues to be limited by pain. Pt unable to ambulate in hallway. (A) to bathroom. Max encouragement for OOB activity with nursing to increase activity tolerance. Pt goal is to return home but discussed with her recommendation of SNF due to incr (A) needed and decr pace of progress.   Follow Up Recommendations  SNF     Equipment Recommendations  None recommended by PT    Recommendations for Other Services       Precautions / Restrictions Precautions Precautions: Knee Required Braces or Orthoses: Knee Immobilizer - Right Knee Immobilizer - Right: On at all times;On except when in CPM Restrictions Weight Bearing Restrictions: Yes RLE Weight Bearing: Weight bearing as tolerated    Mobility  Bed Mobility Overal bed mobility: Needs Assistance Bed Mobility: Sit to Supine       Sit to supine: Min assist   General bed mobility comments: (A) to bring Rt LE into supine positioning  Transfers Overall transfer level: Needs assistance Equipment used: Rolling walker (2 wheeled) Transfers: Sit to/from Stand Sit to Stand: Min assist         General transfer comment: cues for hand placement; min (A) to balance when transferring UEs to RW  Ambulation/Gait Ambulation/Gait assistance: Min assist Ambulation Distance (Feet): 30 Feet Assistive device: Rolling walker (2 wheeled) Gait Pattern/deviations: Step-to pattern;Decreased stance time - right;Decreased step length - left;Antalgic;Trunk flexed;Wide base of support Gait velocity: decr Gait velocity interpretation: Below normal speed for age/gender General Gait Details: min (A) to balance; cues for step through gt sequencing and upright posture; pt with difficulty performing heel strike on Rt LE  due to pain with WB   Stairs            Wheelchair Mobility    Modified Rankin (Stroke Patients Only)       Balance Overall balance assessment: Needs assistance Sitting-balance support: Feet supported;No upper extremity supported Sitting balance-Leahy Scale: Fair Sitting balance - Comments: guarded due to pain   Standing balance support: During functional activity;Bilateral upper extremity supported Standing balance-Leahy Scale: Poor Standing balance comment: RW at all times for balance                    Cognition Arousal/Alertness: Awake/alert Behavior During Therapy: WFL for tasks assessed/performed Overall Cognitive Status: Within Functional Limits for tasks assessed                      Exercises Total Joint Exercises Ankle Circles/Pumps: AROM;Both;10 reps Heel Slides: AAROM;Right;10 reps;Seated Hip ABduction/ADduction: AAROM;Right;10 reps;Seated Long Arc Quad: AAROM;Right;10 reps;Seated;Limitations Long [employer] Limitations: pt using trunk rocking to substitute Knee Flexion: AAROM;5 reps;Seated    General Comments General comments (skin integrity, edema, etc.): cont to encouraged OOB with nursing to improve activity tolerance      Pertinent Vitals/Pain Pain Assessment: 0-10 Pain Score: 8  Pain Location: Rt knee Pain Descriptors / Indicators: Constant;Grimacing Pain Intervention(s): Monitored during session;Premedicated before session;Repositioned;Ice applied    Home Living                      Prior Function            PT Goals (current goals can now be found in the  care plan section) Acute Rehab PT Goals Patient Stated Goal: to not go to rehab PT Goal Formulation: With patient Time For Goal Achievement: 08/12/14 Potential to Achieve Goals: Good Progress towards PT goals: Progressing toward goals    Frequency  7X/week    PT Plan Current plan remains appropriate    Co-evaluation             End of Session  Equipment Utilized During Treatment: Gait belt Activity Tolerance: Patient limited by pain Patient left: with call bell/phone within reach;in bed     Time: 1142-1205 PT Time Calculation (min) (ACUTE ONLY): 23 min  Charges:  $Gait Training: 8-22 mins $Therapeutic Exercise: 8-22 mins                    G CodesGustavus Bryant PT [phone] 08/05/2014, 3:33 PM

## 2014-08-05 NOTE — Plan of Care (Signed)
Problem: Consults Goal: Diagnosis- Total Joint Replacement Outcome: Completed/Met Date Met:  08/05/14 Primary Total Knee Right

## 2014-08-05 NOTE — Care Management Note (Signed)
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 Patient underwent a right total knee arthroplasty.     Action/Plan:   patient will need shortterm rehab at [facility]. Social worker is aware. patient was preoperatively setup with [facility].   Anticipated DC Date:  08/07/2014   Anticipated DC Plan:  SKILLED NURSING FACILITY  In-house referral  Clinical Social Worker      DC Planning Services  CM consult      Choice offered to / List presented to:     DME arranged  NA           [place]   Status of service:  Completed, signed off Medicare Important Message given?   (If response is "NO", the following Medicare IM given date fields will be blank) Date Medicare IM given:   Medicare IM given by:   Date Additional Medicare IM given:   Additional Medicare IM given by:    Discharge Disposition:  [place]  Per UR Regulation:  Reviewed for med. necessity/level of care/duration of stay

## 2014-08-05 NOTE — Clinical Social Work Placement (Signed)
Clinical Social Work Department CLINICAL SOCIAL WORK PLACEMENT NOTE 08/05/2014  Patient:  Amanda Joyce, Amanda Joyce  Account Number:  [id number] Admit date:  08/04/2014  Clinical Social Worker:  Delrae Sawyers  Date/time:  08/05/2014 12:34 PM  Clinical Social Work is seeking post-discharge placement for this patient at the following level of care:   [place]   (*CSW will update this form in Epic as items are completed)   08/05/2014  Patient/family provided with [place] Department of Clinical Social Work's list of facilities offering this level of care within the geographic area requested by the patient (or if unable, by the patient's family).  08/05/2014  Patient/family informed of their freedom to choose among providers that offer the needed level of care, that participate in Medicare, Medicaid or managed care program needed by the patient, have an available bed and are willing to accept the patient.  08/05/2014  Patient/family informed of [facility]' ownership interest in [facility], as well as of the fact that they are under no obligation to receive care at this facility.  PASARR submitted to EDS on 08/05/2014 PASARR number received on 08/05/2014  FL2 transmitted to all facilities in geographic area requested by pt/family on  08/05/2014 FL2 transmitted to all facilities within larger geographic area on   Patient informed that his/her managed care company has contracts with or will negotiate with  certain facilities, including the following:     Patient/family informed of bed offers received:   Patient chooses bed at  Physician recommends and patient chooses bed at    Patient to be transferred to  on   Patient to be transferred to facility by  Patient and family notified of transfer on  Name of family member notified:    The following physician request were entered in Epic:   Additional Comments:  [facility] [place] Cell: [phone]       Fax:  [phone] Clinical Social Work: Orthopedics [phone]) and Surgical [phone])

## 2014-08-06 LAB — BASIC METABOLIC PANEL
Anion gap: 4 — ABNORMAL LOW (ref 5–15)
BUN: 14 mg/dL (ref 6–23)
CALCIUM: 9.4 mg/dL (ref 8.4–10.5)
CO2: 28 mmol/L (ref 19–32)
CREATININE: 0.67 mg/dL (ref 0.50–1.10)
Chloride: 106 mmol/L (ref 96–112)
GFR, EST NON AFRICAN AMERICAN: 84 mL/min — AB (ref >90)
Glucose, Bld: 142 mg/dL — ABNORMAL HIGH (ref 70–99)
Potassium: 4.1 mmol/L (ref 3.5–5.1)
Sodium: 138 mmol/L (ref 135–145)

## 2014-08-06 LAB — CBC
HEMATOCRIT: 30.1 % — AB (ref 36.0–46.0)
Hemoglobin: 10 g/dL — ABNORMAL LOW (ref 12.0–15.0)
MCH: 29.9 pg (ref 26.0–34.0)
MCHC: 33.2 g/dL (ref 30.0–36.0)
MCV: 90.1 fL (ref 78.0–100.0)
Platelets: 216 10*3/uL (ref 150–400)
RBC: 3.34 MIL/uL — ABNORMAL LOW (ref 3.87–5.11)
RDW: 13.4 % (ref 11.5–15.5)
WBC: 17.3 10*3/uL — ABNORMAL HIGH (ref 4.0–10.5)

## 2014-08-06 NOTE — Progress Notes (Signed)
Occupational Therapy Treatment Patient Details Name: Amanda Joyce MRN: [id number] DOB: 1940-12-10 Today's Date: 08/06/2014    History of present illness 74 yo female s/p R TKA PMH: breast CA, arthritis, htn , gout   OT comments  Pt progressing today toward goals. Pt is at adequate level to be able to d/c home with therapy services at this time.   Follow Up Recommendations  Home health OT    Equipment Recommendations  None recommended by OT    Recommendations for Other Services      Precautions / Restrictions Precautions Precautions: Knee Required Braces or Orthoses: Knee Immobilizer - Right Knee Immobilizer - Right: On at all times;On except when in CPM Restrictions RLE Weight Bearing: Weight bearing as tolerated       Mobility Bed Mobility Overal bed mobility: Modified Independent                Transfers Overall transfer level: Needs assistance Equipment used: Rolling walker (2 wheeled) Transfers: Sit to/from Stand Sit to Stand: Supervision              Balance Overall balance assessment: Needs assistance Sitting-balance support: No upper extremity supported Sitting balance-Leahy Scale: Good     Standing balance support: No upper extremity supported Standing balance-Leahy Scale: Good                     ADL Overall ADL's : Modified independent Eating/Feeding: Independent   Grooming: Modified independent;Standing                   Toilet Transfer: Supervision/safety;Ambulation;RW             General ADL Comments: Pt completed complete sponge bath at sink level this session.pt able to exit bed with HOB down and no rails.      Vision                     Perception     Praxis      Cognition   Behavior During Therapy: WFL for tasks assessed/performed Overall Cognitive Status: Within Functional Limits for tasks assessed                       Extremity/Trunk Assessment                Exercises     Shoulder Instructions       General Comments      Pertinent Vitals/ Pain       Pain Assessment: 0-10 Pain Score: 3  Pain Location: Rt knee Pain Intervention(s): Monitored during session  Home Living                                          Prior Functioning/Environment              Frequency Min 2X/week     Progress Toward Goals  OT Goals(current goals can now be found in the care plan section)  Progress towards OT goals: Progressing toward goals  Acute Rehab OT Goals Patient Stated Goal: to return home OT Goal Formulation: With patient/family Potential to Achieve Goals: Good ADL Goals Pt Will Perform Grooming: with min guard assist;standing (met) Pt Will Perform Upper Body Bathing: with min guard assist;standing (met) Pt Will Perform Tub/Shower Transfer: with min guard assist;ambulating;rolling walker;tub bench  Plan Discharge plan needs to  be updated    Co-evaluation                 End of Session Equipment Utilized During Treatment: Rolling walker;Right knee immobilizer   Activity Tolerance Patient tolerated treatment well   Patient Left in chair;with call bell/phone within reach   Nurse Communication Mobility status;Precautions        Time: 3524-8185 OT Time Calculation (min): 21 min  Charges: OT General Charges $OT Visit: 1 Procedure OT Treatments $Self Care/Home Management : 8-22 mins  Parke Poisson B 08/06/2014, 9:18 AM Pager: [phone]

## 2014-08-06 NOTE — Progress Notes (Signed)
Patient ready for discharge. Waiting on son for transportation.

## 2014-08-06 NOTE — Progress Notes (Signed)
Physical Therapy Treatment Patient Details Name: Amanda Joyce MRN: [id number] DOB: 17-Aug-1940 Today's Date: 08/06/2014    History of Present Illness 74 yo female s/p R TKA PMH: breast CA, arthritis, htn , gout    PT Comments    Pt requires min verbal cues to keep RW closer to her body while ambulating and pt verbalized and demonstrated understanding.  PT has updated d/c recommendation to HHPT w/ 24/7 assist as pt safely ambulated 250 ft and navigated steps this session.  Pt reports her son owns his own business and therefore will be available to assist her whenever she needs as he can make his own schedule.  Pt is anticipating d/c to home w/ HHPT today.   Follow Up Recommendations  Home health PT;Supervision/Assistance - 24 hour (Son makes his own work schedule and will avail prn)     Equipment Recommendations  None recommended by PT    Recommendations for Other Services       Precautions / Restrictions Precautions Precautions: Knee;Fall Required Braces or Orthoses: Knee Immobilizer - Right Knee Immobilizer - Right: On at all times;On except when in CPM Restrictions Weight Bearing Restrictions: Yes RLE Weight Bearing: Weight bearing as tolerated    Mobility  Bed Mobility Overal bed mobility: Modified Independent Bed Mobility: Sit to Supine       Sit to supine: Modified independent (Device/Increase time)   General bed mobility comments: increased time to bring BLEs into bed  Transfers Overall transfer level: Needs assistance Equipment used: Rolling walker (2 wheeled) Transfers: Sit to/from Stand Sit to Stand: Supervision         General transfer comment: cues to maintain hands on RW when standing or ambulating.  Ambulation/Gait Ambulation/Gait assistance: Min guard Ambulation Distance (Feet): 250 Feet Assistive device: Rolling walker (2 wheeled) Gait Pattern/deviations: Step-through pattern;Antalgic;Decreased stride length;Decreased stance time - right  (wearing R KI) Gait velocity: decr Gait velocity interpretation: Below normal speed for age/gender General Gait Details: verbal cues to keep RW closer to her body while ambulating which pt reports feels awkward but verbalizes understanding after explaining this for the importance of safety   Stairs Stairs: Yes Stairs assistance: Min guard (hand held assist) Stair Management: One rail Left;No rails;Step to pattern;Backwards;Forwards;With walker Number of Stairs: 2 (x6 (see stair comments)) General stair comments: 2 stairs going backwards using RW to practice stairs pt has to get inside home w/o rails.  2 steps x5 forwards w/ L railing and 1 person hand held assist for stairs pt has to go up to get to 2nd floor to live   Wheelchair Mobility    Modified Rankin (Stroke Patients Only)       Balance Overall balance assessment: Needs assistance Sitting-balance support: Feet supported;No upper extremity supported Sitting balance-Leahy Scale: Good     Standing balance support: Bilateral upper extremity supported Standing balance-Leahy Scale: Fair                      Cognition Arousal/Alertness: Awake/alert Behavior During Therapy: WFL for tasks assessed/performed Overall Cognitive Status: Within Functional Limits for tasks assessed                      Exercises Total Joint Exercises Quad Sets: AROM;Both;5 reps;Seated Hip ABduction/ADduction: AROM;Right;5 reps;Seated Long Arc Quad: AROM;Right;5 reps;Seated Knee Flexion: AROM;AAROM;Right;5 reps;Seated Goniometric ROM: 3-86    General Comments General comments (skin integrity, edema, etc.): Explained to pt how to educate her son on helping her ascend/descend stairs  using teachback and pt was able to successfully repeat back what was taught.  Pt reports she is confident her son will be able to provide appropriate assistance to navigate stairs.      Pertinent Vitals/Pain Pain Assessment: 0-10 Pain Score: 8  Pain  Location: R knee Pain Descriptors / Indicators: Grimacing;Discomfort;Constant;Nagging Pain Intervention(s): Limited activity within patient's tolerance;Monitored during session;Repositioned    Home Living                      Prior Function            PT Goals (current goals can now be found in the care plan section) Acute Rehab PT Goals Patient Stated Goal: to return home PT Goal Formulation: With patient Time For Goal Achievement: 08/12/14 Potential to Achieve Goals: Good Progress towards PT goals: Progressing toward goals    Frequency  7X/week    PT Plan Discharge plan needs to be updated    Co-evaluation             End of Session Equipment Utilized During Treatment: Gait belt;Right knee immobilizer Activity Tolerance: Patient tolerated treatment well Patient left: in bed;with call bell/phone within reach     Time: 1028-1107 PT Time Calculation (min) (ACUTE ONLY): 39 min  Charges:  $Gait Training: 23-37 mins $Therapeutic Exercise: 8-22 mins                    G CodesJoslyn Hy PT, [place] [phone]  [phone] 08/06/2014, 11:27 AM

## 2014-08-06 NOTE — Clinical Social Work Note (Signed)
Patient to discharge home with home health services once medically stable for discharge. RNCM made aware.  CSW signing off. No further CSW needs addressed.  Lubertha Sayres, [place] Cell: [phone]       Fax: [phone] Clinical Social Work: Orthopedics [phone]) and Surgical [phone])

## 2014-08-06 NOTE — Progress Notes (Signed)
Subjective: 2 Days Post-Op Procedure(s) (LRB): RIGHT TOTAL KNEE ARTHROPLASTY (Right) Patient reports pain as mild.  Patients pain level has markedly improved.  No nausea/vomiting, lightheadedness/dizziness.  Tolerating diet.  Objective: Vital signs in last 24 hours: Temp:  [98.4 F (36.9 C)-98.8 F (37.1 C)] 98.4 F (36.9 C) (04/01 0605) Pulse Rate:  [66-71] 70 (04/01 0605) Resp:  [16-18] 16 (04/01 0605) BP: (134-142)/(54-62) 134/56 mmHg (04/01 0605) SpO2:  [17 %-97 %] 94 % (04/01 0605)  Intake/Output from previous day: 03/31 0701 - 04/01 0700 In: 3206.7 [P.O.:810; I.V.:2396.7] Out: 200 [Urine:200] Intake/Output this shift:     Recent Labs  08/04/14 1940 08/05/14 0715  HGB 12.0 11.4*    Recent Labs  08/04/14 1940 08/05/14 0715  WBC 15.1* 11.3*  RBC 4.23 3.83*  HCT 38.2 33.7*  PLT 275 249    Recent Labs  08/04/14 1940 08/05/14 0715  NA  --  137  K  --  4.4  CL  --  105  CO2  --  22  BUN  --  13  CREATININE 0.81 0.64  GLUCOSE  --  109*  CALCIUM  --  9.2   No results for input(s): LABPT, INR in the last 72 hours.  Neurologically intact Neurovascular intact Sensation intact distally Intact pulses distally Dorsiflexion/Plantar flexion intact Incision: dressing C/D/I No cellulitis present Compartment soft  Negative homans bilaterally Dressing changed by me today  Assessment/Plan: 2 Days Post-Op Procedure(s) (LRB): RIGHT TOTAL KNEE ARTHROPLASTY (Right) Advance diet Up with therapy D/C IV fluids Discharge home with home health.  Plan of care was discussed thoroughly with patient pre-operatively.  After seeing her this morning, there is absolutely no reason that SNF should even be a consideration.  She should be d/c today home as her son [name] will be staying with her.  If she needs to stay tonight to work with PT again tomorrow, that is ok to do. WBAT RLE ABLA-mild and stable Dry dressing change prn  Fannie Knee 08/06/2014, 7:10 AM

## 2014-08-11 NOTE — Interval H&P Note (Signed)
History and Physical Interval Note:  08/11/2014 8:40 AM  Amanda Joyce  has presented today for surgery, with the diagnosis of djd right knee  The various methods of treatment have been discussed with the patient and family. After consideration of risks, benefits and other options for treatment, the patient has consented to  Procedure(s): RIGHT TOTAL KNEE ARTHROPLASTY (Right) as a surgical intervention .  The patient's history has been reviewed, patient examined, no change in status, stable for surgery.  I have reviewed the patient's chart and labs.  Questions were answered to the patient's satisfaction.     Terrika Zuver F

## 2014-08-11 NOTE — H&P (View-Only) (Signed)
Subjective: 2 Days Post-Op Procedure(s) (LRB): RIGHT TOTAL KNEE ARTHROPLASTY (Right) Patient reports pain as mild.  Patients pain level has markedly improved.  No nausea/vomiting, lightheadedness/dizziness.  Tolerating diet.  Objective: Vital signs in last 24 hours: Temp:  [98.4 F (36.9 C)-98.8 F (37.1 C)] 98.4 F (36.9 C) (04/01 0605) Pulse Rate:  [66-71] 70 (04/01 0605) Resp:  [16-18] 16 (04/01 0605) BP: (134-142)/(54-62) 134/56 mmHg (04/01 0605) SpO2:  [17 %-97 %] 94 % (04/01 0605)  Intake/Output from previous day: 03/31 0701 - 04/01 0700 In: 3206.7 [P.O.:810; I.V.:2396.7] Out: 200 [Urine:200] Intake/Output this shift:     Recent Labs  08/04/14 1940 08/05/14 0715  HGB 12.0 11.4*    Recent Labs  08/04/14 1940 08/05/14 0715  WBC 15.1* 11.3*  RBC 4.23 3.83*  HCT 38.2 33.7*  PLT 275 249    Recent Labs  08/04/14 1940 08/05/14 0715  NA  --  137  K  --  4.4  CL  --  105  CO2  --  22  BUN  --  13  CREATININE 0.81 0.64  GLUCOSE  --  109*  CALCIUM  --  9.2   No results for input(s): LABPT, INR in the last 72 hours.  Neurologically intact Neurovascular intact Sensation intact distally Intact pulses distally Dorsiflexion/Plantar flexion intact Incision: dressing C/D/I No cellulitis present Compartment soft  Negative homans bilaterally Dressing changed by me today  Assessment/Plan: 2 Days Post-Op Procedure(s) (LRB): RIGHT TOTAL KNEE ARTHROPLASTY (Right) Advance diet Up with therapy D/C IV fluids Discharge home with home health.  Plan of care was discussed thoroughly with patient pre-operatively.  After seeing her this morning, there is absolutely no reason that SNF should even be a consideration.  She should be d/c today home as her son [name] will be staying with her.  If she needs to stay tonight to work with PT again tomorrow, that is ok to do. WBAT RLE ABLA-mild and stable Dry dressing change prn  Fannie Knee 08/06/2014, 7:10 AM

## 2014-08-25 ENCOUNTER — Encounter: Payer: Self-pay | Admitting: Physical Therapy

## 2014-08-25 ENCOUNTER — Ambulatory Visit: Payer: Medicare Other | Attending: Orthopedic Surgery | Admitting: Physical Therapy

## 2014-08-25 ENCOUNTER — Telehealth: Payer: Self-pay | Admitting: Family Medicine

## 2014-08-25 DIAGNOSIS — R262 Difficulty in walking, not elsewhere classified: Secondary | ICD-10-CM | POA: Insufficient documentation

## 2014-08-25 DIAGNOSIS — M25561 Pain in right knee: Secondary | ICD-10-CM

## 2014-08-25 DIAGNOSIS — Z96651 Presence of right artificial knee joint: Secondary | ICD-10-CM | POA: Insufficient documentation

## 2014-08-25 NOTE — Therapy (Signed)
[address] [place], [place], [address] Phone: [phone]   Fax:  [phone]  Physical Therapy Evaluation  Patient Details  Name: Amanda Joyce MRN: [id number] Date of Birth: 1940/06/25 Referring Provider:  Kathryne Hitch, MD  Encounter Date: 08/25/2014      PT End of Session - 08/25/14 1646    Visit Number 1   Date for PT Re-Evaluation 10/25/14   PT Start Time 1618   PT Stop Time 0177   PT Time Calculation (min) 57 min      Past Medical History  Diagnosis Date  . Hypertension   . Depression   . Gout   . Temporomandibular joint disorders, unspecified   . Personal history of malignant neoplasm of breast   . Breast cancer     Left   . Irritable bowel syndrome   . Nontoxic uninodular goiter   . Hyperlipemia   . Hiatal hernia   . GERD (gastroesophageal reflux disease)   . History of stress test >10 yrs. ago    in [place], [place] - no need for follow up  . Anxiety   . Arthritis     neck, shoulders, back, knees   . Osteoarthrosis, unspecified whether generalized or localized, unspecified site     Past Surgical History  Procedure Laterality Date  . Appendectomy    . Hernia repair    . Abdominal hysterectomy    . Thyroid surgery    . Breast lumpectomy      Left breast X2  . Eye surgery Bilateral     /w iol  . Total knee arthroplasty Right 08/04/2014    Procedure: RIGHT TOTAL KNEE ARTHROPLASTY;  Surgeon: Kathryne Hitch, MD;  Location: [facility];  Service: Orthopedics;  Laterality: Right;    There were no vitals filed for this visit.  Visit Diagnosis:  Right knee pain - Plan: PT plan of care cert/re-cert  Difficulty walking - Plan: PT plan of care cert/re-cert      Subjective Assessment - 08/25/14 1623    Subjective patient underwent a right TKR on 08/04/14.  3 day hospital stay and then home PT.     Limitations Standing;Walking;House hold activities   Patient Stated Goals walk without pain,  have normal movement   Currently in Pain? Yes   Pain Score 9    Pain Location Knee   Pain Orientation Right   Pain Descriptors / Indicators Aching;Dull;Sharp;Sore;Tightness   Pain Type Surgical pain   Pain Onset 1 to 4 weeks ago   Pain Frequency Constant   Aggravating Factors  movements, standing and walking   Pain Relieving Factors pain meds and ice   Effect of Pain on Daily Activities limits everything            [facility] PT Assessment - 08/25/14 0001    Assessment   Medical Diagnosis s/p right TKR   Onset Date 08/04/14   Prior Therapy home PT   Precautions   Precautions None   Restrictions   Weight Bearing Restrictions No   Balance Screen   Has the patient fallen in the past 6 months No   Has the patient had a decrease in activity level because of a fear of falling?  No   Is the patient reluctant to leave their home because of a fear of falling?  No   Home Environment   Additional Comments has stairs and lives with her son   Prior Function  Level of Independence Independent with basic ADLs;Independent with homemaking with ambulation   Vocation Retired   Leisure some walking dog   ROM / Strength   AROM / PROM / Strength --  PROM 12-96 degrees flexion, strength 3+/5   AROM   Overall AROM Comments AROM at edge of bed 16-90 degrees flexion   Palpation   Palpation scar is mobile, mild warmth, redness and decreased mobility distal scar area   Special Tests    Special Tests --  quad lag, ballotable patella   Transfers   Transfer via Lift Equipment --  slow, extends leg out in front   Ambulation/Gait   Gait Comments uses a SPC, slow, does not bend the knee held in 20 degrees flexion and does not bend                   [facility] Adult PT Treatment/Exercise - Sep 16, 2014 0001    Knee/Hip Exercises: Aerobic   Tread Mill NuStep Level 4 x 6 minutes   Knee/Hip Exercises: Machines for Strengthening   Cybex Knee Flexion 20# 2x15                PT Education -  16-Sep-2014 1642    Education provided Yes   Education Details proper gait, low load long duration stretches   Person(s) Educated Patient   Methods Explanation;Demonstration;Verbal cues   Comprehension Verbalized understanding;Returned demonstration;Verbal cues required          PT Short Term Goals - 16-Sep-2014 1649    PT SHORT TERM GOAL #1   Title independent with initial HEP   Time 2   Period Weeks   Status New           PT Long Term Goals - 09/16/14 1649    PT LONG TERM GOAL #1   Title decrease pain 50%   Time 8   Period Weeks   Status New   PT LONG TERM GOAL #2   Title increase AROM of the right knee to5-115 degrees flexion   Time 8   Period Weeks   Status New   PT LONG TERM GOAL #3   Title walk without assistive device 500 feet   Time 8   Period Weeks   Status New   PT LONG TERM GOAL #4   Title go up and down stairs step over step   Time 8   Period Weeks   Status New               Plan - 09/16/14 1646    Clinical Impression Statement Patient with a very high rating of pain, poor gait and AROM was 16-90 degrees flexion   Pt will benefit from skilled therapeutic intervention in order to improve on the following deficits Abnormal gait;Decreased mobility;Decreased strength;Decreased scar mobility;Decreased endurance;Decreased range of motion;Difficulty walking;Increased edema;Pain   Rehab Potential Good   PT Frequency 2x / week   PT Duration 8 weeks   PT Treatment/Interventions Cryotherapy;Electrical Stimulation;Functional mobility training;Stair training;Gait training;Therapeutic activities;Therapeutic exercise;Balance training;Manual techniques;Scar mobilization   PT Next Visit Plan add gym exercises   Consulted and Agree with Plan of Care Patient          G-Codes - 09/16/2014 1652    Functional Assessment Tool Used FOTO   Functional Limitation Mobility: Walking and moving around   Mobility: Walking and Moving Around Current Status ([id number]) At least  60 percent but less than 80 percent impaired, limited or restricted   Mobility: Walking and Moving Around  Goal Status [phone]) At least 40 percent but less than 60 percent impaired, limited or restricted       Problem List Patient Active Problem List   Diagnosis Date Noted  . DJD (degenerative joint disease) of knee 08/04/2014  . Viral bronchitis 03/30/2014  . Breast cancer, left breast 01/19/2014  . Insomnia 04/18/2013  . Pulmonary nodules 01/22/2013  . Cervical strain 09/14/2012  . Seborrheic dermatitis of scalp 08/18/2012  . History of breast cancer 10/30/2011  . Hip pain, right 07/02/2011  . Internal hemorrhoids without mention of complication 91/69/4503  . Internal hemorrhoids with other complication 88/82/8003  . ONYCHOMYCOSIS, BILATERAL 07/18/2010  . HYPERLIPIDEMIA 06/08/2010  . ACTINIC KERATOSIS 09/13/2009  . BAKER'S CYST 09/08/2009  . Osteoarthritis 09/06/2009  . CALF PAIN, LEFT 09/06/2009  . OCULAR MIGRAINE 08/11/2009  . KNEE PAIN, BILATERAL 03/09/2009  . DEPRESSIVE DISORDER 09/29/2008  . NECK PAIN 09/29/2008  . BACK PAIN, THORACIC REGION 06/28/2008  . UNSPECIFIED VITAMIN D DEFICIENCY 12/30/2007  . DYSPEPSIA&OTHER Standard DISORDERS FUNCTION STOMACH 12/25/2007  . DYSPHAGIA UNSPECIFIED 12/25/2007  . ANXIETY STATE, UNSPECIFIED 12/22/2007  . HIATAL HERNIA WITH REFLUX 12/22/2007  . Inflamed seborrheic keratosis 06/26/2007  . GOUT 06/10/2007  . Allergic rhinitis, cause unspecified 02/20/2007  . 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 OSTEOARTHRITIS 09/19/2006  . HX, PERSONAL, MALIGNANCY, BREAST 09/19/2006  . THYROID NODULE 09/18/2006  . HYPERTENSION 09/18/2006  . IBS 09/18/2006  . ARTHRITIS 09/18/2006    Sumner Boast, PT 08/25/2014, 5:03 PM  [address] [place], [place], [address] Phone: [phone]   Fax:  [phone]

## 2014-08-25 NOTE — Telephone Encounter (Signed)
Caller name: rockie Relation to pt: self Call back number: [phone] Pharmacy:  Reason for call:   Requesting oxycodone rx

## 2014-08-26 MED ORDER — OXYCODONE-ACETAMINOPHEN 5-325 MG PO TABS: 1.0000 | ORAL_TABLET | ORAL | Status: DC | PRN

## 2014-08-26 NOTE — Telephone Encounter (Signed)
Refill x1 

## 2014-08-26 NOTE — Telephone Encounter (Addendum)
Last seen 02/04/14 and filled 08/04/14 #60 UDS 02/10/13 low risk   Please advise     KP

## 2014-08-26 NOTE — Telephone Encounter (Signed)
Patient aware     KP 

## 2014-08-30 ENCOUNTER — Ambulatory Visit: Payer: Medicare Other | Admitting: Physical Therapy

## 2014-08-30 DIAGNOSIS — R262 Difficulty in walking, not elsewhere classified: Secondary | ICD-10-CM

## 2014-08-30 DIAGNOSIS — M25561 Pain in right knee: Secondary | ICD-10-CM

## 2014-08-30 NOTE — Therapy (Signed)
[address] [place], [place], [address] Phone: [phone]   Fax:  [phone]  Physical Therapy Treatment  Patient Details  Name: Amanda Joyce MRN: [id number] Date of Birth: Sep 14, 1940 Referring Provider:  Kathryne Hitch, MD  Encounter Date: 08/30/2014      PT End of Session - 08/30/14 1516    Visit Number 2   Date for PT Re-Evaluation 10/25/14   PT Start Time 4097   PT Stop Time 1550   PT Time Calculation (min) 65 min      Past Medical History  Diagnosis Date  . Hypertension   . Depression   . Gout   . Temporomandibular joint disorders, unspecified   . Personal history of malignant neoplasm of breast   . Breast cancer     Left   . Irritable bowel syndrome   . Nontoxic uninodular goiter   . Hyperlipemia   . Hiatal hernia   . GERD (gastroesophageal reflux disease)   . History of stress test >10 yrs. ago    in [place], [place] - no need for follow up  . Anxiety   . Arthritis     neck, shoulders, back, knees   . Osteoarthrosis, unspecified whether generalized or localized, unspecified site     Past Surgical History  Procedure Laterality Date  . Appendectomy    . Hernia repair    . Abdominal hysterectomy    . Thyroid surgery    . Breast lumpectomy      Left breast X2  . Eye surgery Bilateral     /w iol  . Total knee arthroplasty Right 08/04/2014    Procedure: RIGHT TOTAL KNEE ARTHROPLASTY;  Surgeon: Kathryne Hitch, MD;  Location: [facility];  Service: Orthopedics;  Laterality: Right;    There were no vitals filed for this visit.  Visit Diagnosis:  Right knee pain  Difficulty walking      Subjective Assessment - 08/30/14 1446    Subjective feeling weak with all activities, having anxiety today. having problem with scooting forward to flexion   Limitations Standing;Walking;House hold activities   Patient Stated Goals walk without pain, have normal movement   Currently in Pain? Yes   Pain Score 5    Pain Location Knee   Pain Orientation Right                         [facility] Adult PT Treatment/Exercise - 08/30/14 0001    Exercises   Exercises Knee/Hip   Knee/Hip Exercises: Stretches   Active Hamstring Stretch 3 reps;30 seconds  with strap with ankle pumps   Passive Hamstring Stretch 2 reps;Other (comment)  hold 3 minutes   Quad Stretch 5 reps;30 seconds   Knee/Hip Exercises: Aerobic   Tread Mill NuStep level 4 X6 min   Knee/Hip Exercises: Machines for Strengthening   Cybex Knee Flexion 20# 2X15   Knee/Hip Exercises: Seated   Long Arc Quad Right;3 sets;10 reps   Other Seated Knee Exercises practiced scooting for flexion   Knee/Hip Exercises: Supine   Heel Slides Right;2 sets;10 reps   Modalities   Modalities Cryotherapy;Electrical Stimulation   Cryotherapy   Number Minutes Cryotherapy 15 Minutes   Type of Cryotherapy Other (comment)  vaso   Electrical Stimulation   Electrical Stimulation Location right knee   Electrical Stimulation Goals Pain;Edema                  PT  Short Term Goals - 08/25/14 1649    PT SHORT TERM GOAL #1   Title independent with initial HEP   Time 2   Period Weeks   Status New           PT Long Term Goals - 08/25/14 1649    PT LONG TERM GOAL #1   Title decrease pain 50%   Time 8   Period Weeks   Status New   PT LONG TERM GOAL #2   Title increase AROM of the right knee to5-115 degrees flexion   Time 8   Period Weeks   Status New   PT LONG TERM GOAL #3   Title walk without assistive device 500 feet   Time 8   Period Weeks   Status New   PT LONG TERM GOAL #4   Title go up and down stairs step over step   Time 8   Period Weeks   Status New               Plan - 08/30/14 1518    Clinical Impression Statement patient did well today with added ther ex and modifications for HEP   Rehab Potential Good   PT Duration 8 weeks   PT Next Visit Plan add gym exercises   Consulted and  Agree with Plan of Care Patient        Problem List Patient Active Problem List   Diagnosis Date Noted  . DJD (degenerative joint disease) of knee 08/04/2014  . Viral bronchitis 03/30/2014  . Breast cancer, left breast 01/19/2014  . Insomnia 04/18/2013  . Pulmonary nodules 01/22/2013  . Cervical strain 09/14/2012  . Seborrheic dermatitis of scalp 08/18/2012  . History of breast cancer 10/30/2011  . Hip pain, right 07/02/2011  . Internal hemorrhoids without mention of complication 65/07/5463  . Internal hemorrhoids with other complication 68/04/7516  . ONYCHOMYCOSIS, BILATERAL 07/18/2010  . HYPERLIPIDEMIA 06/08/2010  . ACTINIC KERATOSIS 09/13/2009  . BAKER'S CYST 09/08/2009  . Osteoarthritis 09/06/2009  . CALF PAIN, LEFT 09/06/2009  . OCULAR MIGRAINE 08/11/2009  . KNEE PAIN, BILATERAL 03/09/2009  . DEPRESSIVE DISORDER 09/29/2008  . NECK PAIN 09/29/2008  . BACK PAIN, THORACIC REGION 06/28/2008  . UNSPECIFIED VITAMIN D DEFICIENCY 12/30/2007  . DYSPEPSIA&OTHER [place] DISORDERS FUNCTION STOMACH 12/25/2007  . DYSPHAGIA UNSPECIFIED 12/25/2007  . ANXIETY STATE, UNSPECIFIED 12/22/2007  . HIATAL HERNIA WITH REFLUX 12/22/2007  . Inflamed seborrheic keratosis 06/26/2007  . GOUT 06/10/2007  . Allergic rhinitis, cause unspecified 02/20/2007  . 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 OSTEOARTHRITIS 09/19/2006  . HX, PERSONAL, MALIGNANCY, BREAST 09/19/2006  . THYROID NODULE 09/18/2006  . HYPERTENSION 09/18/2006  . IBS 09/18/2006  . ARTHRITIS 09/18/2006     Natividad Brood, PTA  08/30/2014, 3:25 PM  [address] [place], [place], [address] Phone: [phone]   Fax:  [phone]

## 2014-08-31 ENCOUNTER — Encounter: Payer: Medicare Other | Admitting: Physical Therapy

## 2014-09-02 ENCOUNTER — Ambulatory Visit: Payer: Medicare Other | Admitting: Physical Therapy

## 2014-09-02 ENCOUNTER — Encounter: Payer: Self-pay | Admitting: Physical Therapy

## 2014-09-02 DIAGNOSIS — M25561 Pain in right knee: Secondary | ICD-10-CM

## 2014-09-02 DIAGNOSIS — R262 Difficulty in walking, not elsewhere classified: Secondary | ICD-10-CM

## 2014-09-02 NOTE — Therapy (Signed)
[address] [place], [place], [address] Phone: [phone]   Fax:  [phone]  Physical Therapy Treatment  Patient Details  Name: Amanda Joyce MRN: [id number] Date of Birth: 02-Oct-1940 Referring Provider:  Kathryne Hitch, MD  Encounter Date: 09/02/2014      PT End of Session - 09/02/14 1742    Visit Number 3   Date for PT Re-Evaluation 10/25/14   PT Start Time 2353   PT Stop Time 6144   PT Time Calculation (min) 44 min      Past Medical History  Diagnosis Date  . Hypertension   . Depression   . Gout   . Temporomandibular joint disorders, unspecified   . Personal history of malignant neoplasm of breast   . Breast cancer     Left   . Irritable bowel syndrome   . Nontoxic uninodular goiter   . Hyperlipemia   . Hiatal hernia   . GERD (gastroesophageal reflux disease)   . History of stress test >10 yrs. ago    in [place], [place] - no need for follow up  . Anxiety   . Arthritis     neck, shoulders, back, knees   . Osteoarthrosis, unspecified whether generalized or localized, unspecified site     Past Surgical History  Procedure Laterality Date  . Appendectomy    . Hernia repair    . Abdominal hysterectomy    . Thyroid surgery    . Breast lumpectomy      Left breast X2  . Eye surgery Bilateral     /w iol  . Total knee arthroplasty Right 08/04/2014    Procedure: RIGHT TOTAL KNEE ARTHROPLASTY;  Surgeon: Kathryne Hitch, MD;  Location: [facility];  Service: Orthopedics;  Laterality: Right;    There were no vitals filed for this visit.  Visit Diagnosis:  Right knee pain  Difficulty walking      Subjective Assessment - 09/02/14 1704    Subjective (p) I did too much. I cooked a big meal for the first time since surgery.   Currently in Pain? (p) Yes   Pain Score (p) 7    Pain Location (p) Knee   Pain Orientation (p) Right   Pain Descriptors / Indicators (p) Aching;Tightness;Sore   Pain Type  (p) Surgical pain   Pain Onset (p) 1 to 4 weeks ago   Pain Frequency (p) Constant   Aggravating Factors  (p) standing to cook            [facility] PT Assessment - 09/02/14 0001    AROM   Overall AROM Comments AROM today to 100 degrees flexion, PROM to 10 degrees flexion                     [facility] Adult PT Treatment/Exercise - 09/02/14 0001    Ambulation/Gait   Gait Comments with SPC, cues for bend, x 150 feet, cues to bend the knee   High Level Balance   High Level Balance Comments light resisted gait fwd, bkwd and side to side   Knee/Hip Exercises: Aerobic   Tread Mill NuStep level 4 X6 min   Knee/Hip Exercises: Machines for Strengthening   Cybex Knee Flexion 20# 2X15   Cybex Leg Press no weight working on bend   Knee/Hip Exercises: Seated   Long Arc Quad Right;3 sets;10 reps   Knee/Hip Exercises: Supine   [employer] 3 sets;10 reps   Short [employer]  Sets 3 sets;10 reps   Short Arc Quad Sets Limitations 3#   Manual Therapy   Manual Therapy Joint mobilization;Myofascial release;Passive ROM   Joint Mobilization scar and patella, joint distraction   Myofascial Release scar    Passive ROM flexion and extension                  PT Short Term Goals - 08/25/14 1649    PT SHORT TERM GOAL #1   Title independent with initial HEP   Time 2   Period Weeks   Status New           PT Long Term Goals - 08/25/14 1649    PT LONG TERM GOAL #1   Title decrease pain 50%   Time 8   Period Weeks   Status New   PT LONG TERM GOAL #2   Title increase AROM of the right knee to5-115 degrees flexion   Time 8   Period Weeks   Status New   PT LONG TERM GOAL #3   Title walk without assistive device 500 feet   Time 8   Period Weeks   Status New   PT LONG TERM GOAL #4   Title go up and down stairs step over step   Time 8   Period Weeks   Status New               Plan - 09/02/14 1743    Clinical Impression Statement Very nice increase in ROM, better  gait, however, has a significant quad lag and significant pain with stretching.   PT Next Visit Plan continue to add exercises as tolerated   Consulted and Agree with Plan of Care Patient        Problem List Patient Active Problem List   Diagnosis Date Noted  . DJD (degenerative joint disease) of knee 08/04/2014  . Viral bronchitis 03/30/2014  . Breast cancer, left breast 01/19/2014  . Insomnia 04/18/2013  . Pulmonary nodules 01/22/2013  . Cervical strain 09/14/2012  . Seborrheic dermatitis of scalp 08/18/2012  . History of breast cancer 10/30/2011  . Hip pain, right 07/02/2011  . Internal hemorrhoids without mention of complication 02/54/2706  . Internal hemorrhoids with other complication 23/76/2831  . ONYCHOMYCOSIS, BILATERAL 07/18/2010  . HYPERLIPIDEMIA 06/08/2010  . ACTINIC KERATOSIS 09/13/2009  . BAKER'S CYST 09/08/2009  . Osteoarthritis 09/06/2009  . CALF PAIN, LEFT 09/06/2009  . OCULAR MIGRAINE 08/11/2009  . KNEE PAIN, BILATERAL 03/09/2009  . DEPRESSIVE DISORDER 09/29/2008  . NECK PAIN 09/29/2008  . BACK PAIN, THORACIC REGION 06/28/2008  . UNSPECIFIED VITAMIN D DEFICIENCY 12/30/2007  . DYSPEPSIA&OTHER [place] DISORDERS FUNCTION STOMACH 12/25/2007  . DYSPHAGIA UNSPECIFIED 12/25/2007  . ANXIETY STATE, UNSPECIFIED 12/22/2007  . HIATAL HERNIA WITH REFLUX 12/22/2007  . Inflamed seborrheic keratosis 06/26/2007  . GOUT 06/10/2007  . Allergic rhinitis, cause unspecified 02/20/2007  . 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 OSTEOARTHRITIS 09/19/2006  . HX, PERSONAL, MALIGNANCY, BREAST 09/19/2006  . THYROID NODULE 09/18/2006  . HYPERTENSION 09/18/2006  . IBS 09/18/2006  . ARTHRITIS 09/18/2006    Sumner Boast, PT 09/02/2014, 5:45 PM  [address] [place], [place], [address] Phone: [phone]   Fax:  [phone]

## 2014-09-06 ENCOUNTER — Ambulatory Visit: Payer: Medicare Other | Attending: Orthopedic Surgery | Admitting: Physical Therapy

## 2014-09-06 DIAGNOSIS — Z96651 Presence of right artificial knee joint: Secondary | ICD-10-CM | POA: Diagnosis not present

## 2014-09-06 DIAGNOSIS — M25561 Pain in right knee: Secondary | ICD-10-CM

## 2014-09-06 DIAGNOSIS — R262 Difficulty in walking, not elsewhere classified: Secondary | ICD-10-CM | POA: Insufficient documentation

## 2014-09-06 NOTE — Therapy (Signed)
[address] [place], [place], [address] Phone: [phone]   Fax:  [phone]  Physical Therapy Treatment  Patient Details  Name: Amanda Joyce MRN: [id number] Date of Birth: [birth year] Referring Provider:  Kathryne Hitch, MD  Encounter Date: 09/06/2014      PT End of Session - 09/06/14 1615    Visit Number 4   Date for PT Re-Evaluation 10/25/14   PT Start Time 0932   PT Stop Time 1630   PT Time Calculation (min) 53 min   Activity Tolerance Patient tolerated treatment well   Behavior During Therapy [facility] for tasks assessed/performed      Past Medical History  Diagnosis Date  . Hypertension   . Depression   . Gout   . Temporomandibular joint disorders, unspecified   . Personal history of malignant neoplasm of breast   . Breast cancer     Left   . Irritable bowel syndrome   . Nontoxic uninodular goiter   . Hyperlipemia   . Hiatal hernia   . GERD (gastroesophageal reflux disease)   . History of stress test >10 yrs. ago    in [place], [place] - no need for follow up  . Anxiety   . Arthritis     neck, shoulders, back, knees   . Osteoarthrosis, unspecified whether generalized or localized, unspecified site     Past Surgical History  Procedure Laterality Date  . Appendectomy    . Hernia repair    . Abdominal hysterectomy    . Thyroid surgery    . Breast lumpectomy      Left breast X2  . Eye surgery Bilateral     /w iol  . Total knee arthroplasty Right 08/04/2014    Procedure: RIGHT TOTAL KNEE ARTHROPLASTY;  Surgeon: Kathryne Hitch, MD;  Location: [facility];  Service: Orthopedics;  Laterality: Right;    There were no vitals filed for this visit.  Visit Diagnosis:  Right knee pain  Difficulty walking      Subjective Assessment - 09/06/14 1540    Subjective a little sore and stiff today; had some aches and pains over the weekend "I think it's the weather."   Patient Stated Goals walk without  pain, have normal movement   Currently in Pain? Yes   Pain Score 6    Pain Location Knee   Pain Orientation Right   Pain Descriptors / Indicators Constant;Aching   Pain Type Surgical pain   Pain Onset More than a month ago   Pain Frequency Constant   Aggravating Factors  movements; standing; walking   Pain Relieving Factors pain meds and ice                         [facility] Adult PT Treatment/Exercise - 09/06/14 1542    Knee/Hip Exercises: Aerobic   Stationary Bike seat 4 partial revolutions x 6 min   Tread Mill NuStep level 4 X6 min   Knee/Hip Exercises: Machines for Strengthening   Cybex Knee Extension 5# 2x15   Cybex Knee Flexion 20# 2X15   Modalities   Modalities Cryotherapy;Electrical Stimulation   Cryotherapy   Number Minutes Cryotherapy 15 Minutes   Cryotherapy Location Knee   Type of Cryotherapy Ice pack   Electrical Stimulation   Electrical Stimulation Location right knee   Electrical Stimulation Action IFC   Electrical Stimulation Parameters to tolerance x 15 min   Electrical Stimulation Goals Pain;Edema  Manual Therapy   Manual Therapy Joint mobilization;Myofascial release;Passive ROM   Joint Mobilization scar and patella, joint distraction   Myofascial Release scar    Passive ROM flexion                  PT Short Term Goals - 08/25/14 1649    PT SHORT TERM GOAL #1   Title independent with initial HEP   Time 2   Period Weeks   Status New           PT Long Term Goals - 08/25/14 1649    PT LONG TERM GOAL #1   Title decrease pain 50%   Time 8   Period Weeks   Status New   PT LONG TERM GOAL #2   Title increase AROM of the right knee to5-115 degrees flexion   Time 8   Period Weeks   Status New   PT LONG TERM GOAL #3   Title walk without assistive device 500 feet   Time 8   Period Weeks   Status New   PT LONG TERM GOAL #4   Title go up and down stairs step over step   Time 8   Period Weeks   Status New                Plan - 09/06/14 1616    Clinical Impression Statement Pt reports increased soreness over weekend but able to tolerate session today with minimal increase in pain.     PT Next Visit Plan continue to add exercises as tolerated   Consulted and Agree with Plan of Care Patient        Problem List Patient Active Problem List   Diagnosis Date Noted  . DJD (degenerative joint disease) of knee 08/04/2014  . Viral bronchitis 03/30/2014  . Breast cancer, left breast 01/19/2014  . Insomnia 04/18/2013  . Pulmonary nodules 01/22/2013  . Cervical strain 09/14/2012  . Seborrheic dermatitis of scalp 08/18/2012  . History of breast cancer 10/30/2011  . Hip pain, right 07/02/2011  . Internal hemorrhoids without mention of complication 39/76/7341  . Internal hemorrhoids with other complication 93/79/0240  . ONYCHOMYCOSIS, BILATERAL 07/18/2010  . HYPERLIPIDEMIA 06/08/2010  . ACTINIC KERATOSIS 09/13/2009  . BAKER'S CYST 09/08/2009  . Osteoarthritis 09/06/2009  . CALF PAIN, LEFT 09/06/2009  . OCULAR MIGRAINE 08/11/2009  . KNEE PAIN, BILATERAL 03/09/2009  . DEPRESSIVE DISORDER 09/29/2008  . NECK PAIN 09/29/2008  . BACK PAIN, THORACIC REGION 06/28/2008  . UNSPECIFIED VITAMIN D DEFICIENCY 12/30/2007  . DYSPEPSIA&OTHER [place] DISORDERS FUNCTION STOMACH 12/25/2007  . DYSPHAGIA UNSPECIFIED 12/25/2007  . ANXIETY STATE, UNSPECIFIED 12/22/2007  . HIATAL HERNIA WITH REFLUX 12/22/2007  . Inflamed seborrheic keratosis 06/26/2007  . GOUT 06/10/2007  . Allergic rhinitis, cause unspecified 02/20/2007  . 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 OSTEOARTHRITIS 09/19/2006  . HX, PERSONAL, MALIGNANCY, BREAST 09/19/2006  . THYROID NODULE 09/18/2006  . HYPERTENSION 09/18/2006  . IBS 09/18/2006  . ARTHRITIS 09/18/2006   Laureen Abrahams, PT, DPT 09/06/2014 4:32 PM  [address] [place], [place], [address] Phone: [phone]    Fax:  [phone]

## 2014-09-09 ENCOUNTER — Ambulatory Visit: Payer: Medicare Other | Admitting: Physical Therapy

## 2014-09-09 ENCOUNTER — Encounter: Payer: Self-pay | Admitting: Physical Therapy

## 2014-09-09 DIAGNOSIS — M25561 Pain in right knee: Secondary | ICD-10-CM

## 2014-09-09 DIAGNOSIS — R262 Difficulty in walking, not elsewhere classified: Secondary | ICD-10-CM

## 2014-09-09 NOTE — Therapy (Signed)
[address] [place], [place], [address] Phone: [phone]   Fax:  [phone]  Physical Therapy Treatment  Patient Details  Name: Amanda Joyce MRN: [id number] Date of Birth: 03/24/1941 Referring Provider:  Kathryne Hitch, MD  Encounter Date: 09/09/2014      PT End of Session - 09/09/14 1533    Visit Number 5   Date for PT Re-Evaluation 10/25/14   PT Start Time 7169   PT Stop Time 1535   PT Time Calculation (min) 49 min      Past Medical History  Diagnosis Date  . Hypertension   . Depression   . Gout   . Temporomandibular joint disorders, unspecified   . Personal history of malignant neoplasm of breast   . Breast cancer     Left   . Irritable bowel syndrome   . Nontoxic uninodular goiter   . Hyperlipemia   . Hiatal hernia   . GERD (gastroesophageal reflux disease)   . History of stress test >10 yrs. ago    in [place], [place] - no need for follow up  . Anxiety   . Arthritis     neck, shoulders, back, knees   . Osteoarthrosis, unspecified whether generalized or localized, unspecified site     Past Surgical History  Procedure Laterality Date  . Appendectomy    . Hernia repair    . Abdominal hysterectomy    . Thyroid surgery    . Breast lumpectomy      Left breast X2  . Eye surgery Bilateral     /w iol  . Total knee arthroplasty Right 08/04/2014    Procedure: RIGHT TOTAL KNEE ARTHROPLASTY;  Surgeon: Kathryne Hitch, MD;  Location: [facility];  Service: Orthopedics;  Laterality: Right;    There were no vitals filed for this visit.  Visit Diagnosis:  Right knee pain  Difficulty walking      Subjective Assessment - 09/09/14 1451    Subjective The rainy wet weather seems to bother me more, I am achy and stiff   Patient Stated Goals walk without pain, have normal movement   Currently in Pain? Yes   Pain Score 5    Pain Location Knee   Pain Orientation Right   Pain Descriptors / Indicators  Aching   Pain Type Surgical pain   Aggravating Factors  standing and stretching, worse after sitting for a period of time   Pain Relieving Factors rest, pain meds            [facility] PT Assessment - 09/09/14 0001    ROM / Strength   AROM / PROM / Strength --  PROM 5-110 degrees flexion   AROM   Overall AROM Comments AROM at edge of bed is 10-105 degrees flexion                     [facility] Adult PT Treatment/Exercise - 09/09/14 0001    Ambulation/Gait   Stairs Yes   Stairs Assistance 6: Modified independent (Device/Increase time)   Stair Management Technique Alternating pattern   Gait Comments with SPC, cues for bend, x 250 feet, cues to bend the knee   High Level Balance   High Level Balance Comments resisted gait all directions, on airex balance activities   Knee/Hip Exercises: Aerobic   Tread Mill NuStep level 5 X6 min   Knee/Hip Exercises: Machines for Strengthening   Cybex Knee Extension 5# 2x15   Cybex  Knee Flexion 20# 2X15   Cybex Leg Press 20# and no weight   Manual Therapy   Manual Therapy Joint mobilization;Myofascial release;Passive ROM   Myofascial Release scar    Passive ROM flexion                  PT Short Term Goals - 08/25/14 1649    PT SHORT TERM GOAL #1   Title independent with initial HEP   Time 2   Period Weeks   Status New           PT Long Term Goals - 09/09/14 1455    PT LONG TERM GOAL #1   Title decrease pain 50%   Status Partially Met   PT LONG TERM GOAL #2   Title increase AROM of the right knee to5-115 degrees flexion   Status Partially Met   PT LONG TERM GOAL #4   Title go up and down stairs step over step   Status On-going               Plan - 09/09/14 1456    Clinical Impression Statement Patient with c/o stiff knee, she reports that she does not like to stretch, she is gaining ROM over all but still stiff.  First few steps after sitting is very stiff and antalgic but will smooth out   PT  Frequency 2x / week   PT Duration 4 weeks   PT Next Visit Plan continue to add exercises as tolerated   Consulted and Agree with Plan of Care Patient        Problem List Patient Active Problem List   Diagnosis Date Noted  . DJD (degenerative joint disease) of knee 08/04/2014  . Viral bronchitis 03/30/2014  . Breast cancer, left breast 01/19/2014  . Insomnia 04/18/2013  . Pulmonary nodules 01/22/2013  . Cervical strain 09/14/2012  . Seborrheic dermatitis of scalp 08/18/2012  . History of breast cancer 10/30/2011  . Hip pain, right 07/02/2011  . Internal hemorrhoids without mention of complication 19/62/2297  . Internal hemorrhoids with other complication 98/92/1194  . ONYCHOMYCOSIS, BILATERAL 07/18/2010  . HYPERLIPIDEMIA 06/08/2010  . ACTINIC KERATOSIS 09/13/2009  . BAKER'S CYST 09/08/2009  . Osteoarthritis 09/06/2009  . CALF PAIN, LEFT 09/06/2009  . OCULAR MIGRAINE 08/11/2009  . KNEE PAIN, BILATERAL 03/09/2009  . DEPRESSIVE DISORDER 09/29/2008  . NECK PAIN 09/29/2008  . BACK PAIN, THORACIC REGION 06/28/2008  . UNSPECIFIED VITAMIN D DEFICIENCY 12/30/2007  . DYSPEPSIA&OTHER [place] DISORDERS FUNCTION STOMACH 12/25/2007  . DYSPHAGIA UNSPECIFIED 12/25/2007  . ANXIETY STATE, UNSPECIFIED 12/22/2007  . HIATAL HERNIA WITH REFLUX 12/22/2007  . Inflamed seborrheic keratosis 06/26/2007  . GOUT 06/10/2007  . Allergic rhinitis, cause unspecified 02/20/2007  . 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 OSTEOARTHRITIS 09/19/2006  . HX, PERSONAL, MALIGNANCY, BREAST 09/19/2006  . THYROID NODULE 09/18/2006  . HYPERTENSION 09/18/2006  . IBS 09/18/2006  . ARTHRITIS 09/18/2006    Sumner Boast, PT 09/09/2014, 3:34 PM  [address] [place], [place], [address] Phone: [phone]   Fax:  [phone]

## 2014-09-14 ENCOUNTER — Ambulatory Visit: Payer: Medicare Other | Admitting: Physical Therapy

## 2014-09-16 ENCOUNTER — Ambulatory Visit: Payer: Medicare Other | Admitting: Physical Therapy

## 2014-09-16 ENCOUNTER — Encounter: Payer: Self-pay | Admitting: Physical Therapy

## 2014-09-16 DIAGNOSIS — M25561 Pain in right knee: Secondary | ICD-10-CM | POA: Diagnosis not present

## 2014-09-16 DIAGNOSIS — R262 Difficulty in walking, not elsewhere classified: Secondary | ICD-10-CM

## 2014-09-16 NOTE — Therapy (Signed)
[address] [place], [place], [address] Phone: [phone]   Fax:  [phone]  Physical Therapy Treatment  Patient Details  Name: Amanda Joyce MRN: [id number] Date of Birth: 15-Aug-1940 Referring Provider:  Kathryne Hitch, MD  Encounter Date: 09/16/2014      PT End of Session - 09/16/14 1741    Visit Number 6   Date for PT Re-Evaluation 10/25/14   PT Start Time 1608   PT Stop Time 1701   PT Time Calculation (min) 53 min      Past Medical History  Diagnosis Date  . Hypertension   . Depression   . Gout   . Temporomandibular joint disorders, unspecified   . Personal history of malignant neoplasm of breast   . Breast cancer     Left   . Irritable bowel syndrome   . Nontoxic uninodular goiter   . Hyperlipemia   . Hiatal hernia   . GERD (gastroesophageal reflux disease)   . History of stress test >10 yrs. ago    in [place], [place] - no need for follow up  . Anxiety   . Arthritis     neck, shoulders, back, knees   . Osteoarthrosis, unspecified whether generalized or localized, unspecified site     Past Surgical History  Procedure Laterality Date  . Appendectomy    . Hernia repair    . Abdominal hysterectomy    . Thyroid surgery    . Breast lumpectomy      Left breast X2  . Eye surgery Bilateral     /w iol  . Total knee arthroplasty Right 08/04/2014    Procedure: RIGHT TOTAL KNEE ARTHROPLASTY;  Surgeon: Kathryne Hitch, MD;  Location: [facility];  Service: Orthopedics;  Laterality: Right;    There were no vitals filed for this visit.  Visit Diagnosis:  Right knee pain  Difficulty walking      Subjective Assessment - 09/16/14 1738    Subjective I drove today.  MD was pleased, but I need better extension   Currently in Pain? Yes   Pain Score 2    Pain Location Knee   Pain Orientation Right   Pain Descriptors / Indicators Aching   Pain Type Surgical pain                          [facility] Adult PT Treatment/Exercise - 09/16/14 0001    Ambulation/Gait   Gait Comments down stairs without assistive device, step over step.  out side 250 feet, some cues to heel strike   Knee/Hip Exercises: Aerobic   Tread Mill NuStep level 5 X6 min   Knee/Hip Exercises: Machines for Strengthening   Cybex Knee Extension 5# 2x15   Cybex Knee Flexion 25# 2x15   Cybex Leg Press 20# and no weight   Knee/Hip Exercises: Seated   Long Arc Quad Right;3 sets;10 reps   Long Arc Quad Weight 3 lbs.   Knee/Hip Exercises: Supine   Quad Sets 3 sets;10 reps   Short Arc Quad Sets 3 sets;10 reps   Short Arc Quad Sets Limitations 3   Manual Therapy   Manual Therapy Joint mobilization;Myofascial release;Passive ROM   Joint Mobilization scar and patella, joint distraction   Myofascial Release scar    Passive ROM focus today was on extension                  PT Short Term Goals -  08/25/14 1649    PT SHORT TERM GOAL #1   Title independent with initial HEP   Time 2   Period Weeks   Status New           PT Long Term Goals - 09/09/14 1455    PT LONG TERM GOAL #1   Title decrease pain 50%   Status Partially Met   PT LONG TERM GOAL #2   Title increase AROM of the right knee to5-115 degrees flexion   Status Partially Met   PT LONG TERM GOAL #4   Title go up and down stairs step over step   Status On-going               Plan - 09/16/14 1741    Clinical Impression Statement Very tight into extension, reiterated today that she needs to do low load long duration stretches at home   PT Next Visit Plan focus on TKE   Consulted and Agree with Plan of Care Patient        Problem List Patient Active Problem List   Diagnosis Date Noted  . DJD (degenerative joint disease) of knee 08/04/2014  . Viral bronchitis 03/30/2014  . Breast cancer, left breast 01/19/2014  . Insomnia 04/18/2013  . Pulmonary nodules 01/22/2013  . Cervical strain  09/14/2012  . Seborrheic dermatitis of scalp 08/18/2012  . History of breast cancer 10/30/2011  . Hip pain, right 07/02/2011  . Internal hemorrhoids without mention of complication 50/51/8335  . Internal hemorrhoids with other complication 82/51/8984  . ONYCHOMYCOSIS, BILATERAL 07/18/2010  . HYPERLIPIDEMIA 06/08/2010  . ACTINIC KERATOSIS 09/13/2009  . BAKER'S CYST 09/08/2009  . Osteoarthritis 09/06/2009  . CALF PAIN, LEFT 09/06/2009  . OCULAR MIGRAINE 08/11/2009  . KNEE PAIN, BILATERAL 03/09/2009  . DEPRESSIVE DISORDER 09/29/2008  . NECK PAIN 09/29/2008  . BACK PAIN, THORACIC REGION 06/28/2008  . UNSPECIFIED VITAMIN D DEFICIENCY 12/30/2007  . DYSPEPSIA&OTHER [place] DISORDERS FUNCTION STOMACH 12/25/2007  . DYSPHAGIA UNSPECIFIED 12/25/2007  . ANXIETY STATE, UNSPECIFIED 12/22/2007  . HIATAL HERNIA WITH REFLUX 12/22/2007  . Inflamed seborrheic keratosis 06/26/2007  . GOUT 06/10/2007  . Allergic rhinitis, cause unspecified 02/20/2007  . 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 OSTEOARTHRITIS 09/19/2006  . HX, PERSONAL, MALIGNANCY, BREAST 09/19/2006  . THYROID NODULE 09/18/2006  . HYPERTENSION 09/18/2006  . IBS 09/18/2006  . ARTHRITIS 09/18/2006    Sumner Boast, PT 09/16/2014, 5:43 PM  [address] [place], [place], [address] Phone: [phone]   Fax:  [phone]

## 2014-09-20 ENCOUNTER — Ambulatory Visit: Payer: Medicare Other | Admitting: Physical Therapy

## 2014-09-23 ENCOUNTER — Ambulatory Visit: Payer: Medicare Other | Admitting: Physical Therapy

## 2014-09-23 DIAGNOSIS — M25561 Pain in right knee: Secondary | ICD-10-CM

## 2014-09-23 NOTE — Therapy (Signed)
[address] [place], [place], [address] Phone: [phone]   Fax:  [phone]  Physical Therapy Treatment  Patient Details  Name: Amanda Joyce MRN: [id number] Date of Birth: [birth year] Referring Provider:  Kathryne Hitch, MD  Encounter Date: 09/23/2014      PT End of Session - 09/23/14 1609    Visit Number 7   Date for PT Re-Evaluation 10/25/14   PT Start Time 1525   PT Stop Time 1633   PT Time Calculation (min) 68 min   Activity Tolerance Patient tolerated treatment well   Behavior During Therapy [facility] for tasks assessed/performed      Past Medical History  Diagnosis Date  . Hypertension   . Depression   . Gout   . Temporomandibular joint disorders, unspecified   . Personal history of malignant neoplasm of breast   . Breast cancer     Left   . Irritable bowel syndrome   . Nontoxic uninodular goiter   . Hyperlipemia   . Hiatal hernia   . GERD (gastroesophageal reflux disease)   . History of stress test >10 yrs. ago    in [place], [place] - no need for follow up  . Anxiety   . Arthritis     neck, shoulders, back, knees   . Osteoarthrosis, unspecified whether generalized or localized, unspecified site     Past Surgical History  Procedure Laterality Date  . Appendectomy    . Hernia repair    . Abdominal hysterectomy    . Thyroid surgery    . Breast lumpectomy      Left breast X2  . Eye surgery Bilateral     /w iol  . Total knee arthroplasty Right 08/04/2014    Procedure: RIGHT TOTAL KNEE ARTHROPLASTY;  Surgeon: Kathryne Hitch, MD;  Location: [employer];  Service: Orthopedics;  Laterality: Right;    There were no vitals filed for this visit.  Visit Diagnosis:  Right knee pain      Subjective Assessment - 09/23/14 1530    Subjective still driving. no problems.  up and stairs are great.    Limitations Standing;Walking;House hold activities   Patient Stated Goals walk without pain, have  normal movement   Currently in Pain? Yes   Pain Score 3    Pain Location Knee   Pain Descriptors / Indicators Aching   Pain Type Surgical pain   Pain Onset More than a month ago                         [facility] Adult PT Treatment/Exercise - 09/23/14 0001    Knee/Hip Exercises: Stretches   Active Hamstring Stretch 3 reps;30 seconds  with belt   Gastroc Stretch 3 reps;30 seconds  runners   Soleus Stretch 2 reps;30 seconds  runners   Knee/Hip Exercises: Aerobic   Tread Mill NuStep [id number]   Knee/Hip Exercises: Standing   Knee Flexion Right;5 sets  on 8 in stool with UE support   Terminal Knee Extension Right;5 sets  5 X 30"  bouts of extensions with foot on 8" stool   Knee/Hip Exercises: Supine   Quad Sets 3 sets;Right   Bridges Both;2 sets;10 reps  low bridge X 5" 2X 10   Cryotherapy   Cryotherapy Location Knee  right   Type of Cryotherapy Ice pack   Electrical Stimulation   Electrical Stimulation Location R knee   Electrical Stimulation Goals Pain  Manual Therapy   Manual Therapy Joint mobilization  extension / flexion [place], grd. 3&4   Passive ROM extension                   PT Short Term Goals - 08/25/14 1649    PT SHORT TERM GOAL #1   Title independent with initial HEP   Time 2   Period Weeks   Status New           PT Long Term Goals - 09/09/14 1455    PT LONG TERM GOAL #1   Title decrease pain 50%   Status Partially Met   PT LONG TERM GOAL #2   Title increase AROM of the right knee to5-115 degrees flexion   Status Partially Met   PT LONG TERM GOAL #4   Title go up and down stairs step over step   Status On-going               Plan - 09/23/14 1611    Clinical Impression Statement patinet did well with today. she is having difficulty working into extension.   Pt will benefit from skilled therapeutic intervention in order to improve on the following deficits Abnormal gait;Decreased mobility;Decreased  strength;Decreased scar mobility;Decreased endurance;Decreased range of motion;Difficulty walking;Increased edema;Pain   PT Duration 4 weeks   PT Treatment/Interventions Cryotherapy;Electrical Stimulation;Functional mobility training;Stair training;Gait training;Therapeutic activities;Therapeutic exercise;Balance training;Manual techniques;Scar mobilization   PT Next Visit Plan focus on TKE   Consulted and Agree with Plan of Care Patient        Problem List Patient Active Problem List   Diagnosis Date Noted  . DJD (degenerative joint disease) of knee 08/04/2014  . Viral bronchitis 03/30/2014  . Breast cancer, left breast 01/19/2014  . Insomnia 04/18/2013  . Pulmonary nodules 01/22/2013  . Cervical strain 09/14/2012  . Seborrheic dermatitis of scalp 08/18/2012  . History of breast cancer 10/30/2011  . Hip pain, right 07/02/2011  . Internal hemorrhoids without mention of complication 72/53/6644  . Internal hemorrhoids with other complication 03/47/4259  . ONYCHOMYCOSIS, BILATERAL 07/18/2010  . HYPERLIPIDEMIA 06/08/2010  . ACTINIC KERATOSIS 09/13/2009  . BAKER'S CYST 09/08/2009  . Osteoarthritis 09/06/2009  . CALF PAIN, LEFT 09/06/2009  . OCULAR MIGRAINE 08/11/2009  . KNEE PAIN, BILATERAL 03/09/2009  . DEPRESSIVE DISORDER 09/29/2008  . NECK PAIN 09/29/2008  . BACK PAIN, THORACIC REGION 06/28/2008  . UNSPECIFIED VITAMIN D DEFICIENCY 12/30/2007  . DYSPEPSIA&OTHER [place] DISORDERS FUNCTION STOMACH 12/25/2007  . DYSPHAGIA UNSPECIFIED 12/25/2007  . ANXIETY STATE, UNSPECIFIED 12/22/2007  . HIATAL HERNIA WITH REFLUX 12/22/2007  . Inflamed seborrheic keratosis 06/26/2007  . GOUT 06/10/2007  . Allergic rhinitis, cause unspecified 02/20/2007  . 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 OSTEOARTHRITIS 09/19/2006  . HX, PERSONAL, MALIGNANCY, BREAST 09/19/2006  . THYROID NODULE 09/18/2006  . HYPERTENSION 09/18/2006  . IBS 09/18/2006  . ARTHRITIS 09/18/2006     Natividad Brood,  PTA  09/23/2014, 4:13 PM  [address] [place], [place], [address] Phone: [phone]   Fax:  [phone]

## 2014-09-29 ENCOUNTER — Telehealth: Payer: Self-pay | Admitting: Family Medicine

## 2014-09-29 NOTE — Telephone Encounter (Signed)
Relation to pt: self  Call back number: [phone]   Reason for call:   Pt requesting a 90 day supply oxyCODONE-acetaminophen (ROXICET) 5-325 MG per tablet. Pt expressed she was not happy last time she ask for a request of oxycodone they prescribed a 60 day supply. Pt is admant about 90 day supply please advise

## 2014-09-30 MED ORDER — OXYCODONE-ACETAMINOPHEN 5-325 MG PO TABS: 1.0000 | ORAL_TABLET | ORAL | Status: DC | PRN

## 2014-09-30 NOTE — Telephone Encounter (Signed)
Patient aware Rx ready for pick up.      KP 

## 2014-09-30 NOTE — Telephone Encounter (Signed)
Oxycodone  Last seen 02/04/14 and filled 08/26/14 #60 patient normally gets 90  UDS 02/10/13 Low risk   Please advise      KP

## 2014-09-30 NOTE — Telephone Encounter (Signed)
Refill x1 

## 2014-10-06 ENCOUNTER — Encounter: Payer: Self-pay | Admitting: Physical Therapy

## 2014-10-06 ENCOUNTER — Ambulatory Visit: Payer: Medicare Other | Attending: Orthopedic Surgery | Admitting: Physical Therapy

## 2014-10-06 DIAGNOSIS — M25561 Pain in right knee: Secondary | ICD-10-CM

## 2014-10-06 DIAGNOSIS — R262 Difficulty in walking, not elsewhere classified: Secondary | ICD-10-CM | POA: Insufficient documentation

## 2014-10-06 NOTE — Therapy (Signed)
[address] [place], [place], [address] Phone: [phone]   Fax:  [phone]  Physical Therapy Treatment  Patient Details  Name: Amanda Joyce MRN: [id number] Date of Birth: [birth year] Referring Provider:  Kathryne Hitch, MD  Encounter Date: 10/06/2014      PT End of Session - 10/06/14 1653    Visit Number 8   Date for PT Re-Evaluation 10/25/14   PT Start Time 9379   PT Stop Time 1707   PT Time Calculation (min) 53 min      Past Medical History  Diagnosis Date  . Hypertension   . Depression   . Gout   . Temporomandibular joint disorders, unspecified   . Personal history of malignant neoplasm of breast   . Breast cancer     Left   . Irritable bowel syndrome   . Nontoxic uninodular goiter   . Hyperlipemia   . Hiatal hernia   . GERD (gastroesophageal reflux disease)   . History of stress test >10 yrs. ago    in [place], [place] - no need for follow up  . Anxiety   . Arthritis     neck, shoulders, back, knees   . Osteoarthrosis, unspecified whether generalized or localized, unspecified site     Past Surgical History  Procedure Laterality Date  . Appendectomy    . Hernia repair    . Abdominal hysterectomy    . Thyroid surgery    . Breast lumpectomy      Left breast X2  . Eye surgery Bilateral     /w iol  . Total knee arthroplasty Right 08/04/2014    Procedure: RIGHT TOTAL KNEE ARTHROPLASTY;  Surgeon: Kathryne Hitch, MD;  Location: [facility];  Service: Orthopedics;  Laterality: Right;    There were no vitals filed for this visit.  Visit Diagnosis:  Right knee pain  Difficulty walking      Subjective Assessment - 10/06/14 1619    Subjective I just feel stiff.  I don't think the bending is coming fast.  I am doing more around the house.   Currently in Pain? Yes   Pain Score 4   some pain in the back a 6/10 today   Pain Location Knee   Pain Orientation Right   Pain Descriptors /  Indicators Aching            [facility] PT Assessment - 10/06/14 0001    AROM   Overall AROM Comments AROM of the right knee at edge of bed after PROM was to 108 degrees flexion, PROM to 110 with a lot of pain                     [facility] Adult PT Treatment/Exercise - 10/06/14 0001    Knee/Hip Exercises: Aerobic   Stationary Bike Seat position 8 full revolutions x 4 minutes   Tread Mill NuStep L5X6   Knee/Hip Exercises: Machines for Strengthening   Cybex Knee Extension 5# 2x15   Cybex Knee Flexion 25# 2x15   Cybex Leg Press 20# and no weight, working on deeper flexion   Knee/Hip Exercises: Standing   Terminal Knee Extension Right;5 sets   Cryotherapy   Number Minutes Cryotherapy 15 Minutes   Cryotherapy Location Knee   Type of Cryotherapy Ice pack   Electrical Stimulation   Electrical Stimulation Location R knee   Electrical Stimulation Action IFC   Electrical Stimulation Parameters to tolerance   Electrical  Stimulation Goals Pain   Manual Therapy   Manual Therapy Joint mobilization   Joint Mobilization scar and patella, joint distraction   Myofascial Release scar    Passive ROM contract relax, flexion and extension                  PT Short Term Goals - 08/25/14 1649    PT SHORT TERM GOAL #1   Title independent with initial HEP   Time 2   Period Weeks   Status New           PT Long Term Goals - 10/06/14 1655    PT LONG TERM GOAL #1   Title decrease pain 50%   Status Partially Met   PT LONG TERM GOAL #2   Title increase AROM of the right knee to5-115 degrees flexion   Status Partially Met   PT LONG TERM GOAL #3   Title walk without assistive device 500 feet   Status Achieved   PT LONG TERM GOAL #4   Title go up and down stairs step over step   Status Partially Met               Plan - 10/06/14 1653    Clinical Impression Statement Stiff, reports that she has not been stretching at home.  It took some PROM, joint mobs and  contract relax but AROM got to 107 degrees flexion and PTOM to 110 degrees with a lot of pain today   PT Next Visit Plan will talk with patient about her continuing1x/week   Consulted and Agree with Plan of Care Patient        Problem List Patient Active Problem List   Diagnosis Date Noted  . DJD (degenerative joint disease) of knee 08/04/2014  . Viral bronchitis 03/30/2014  . Breast cancer, left breast 01/19/2014  . Insomnia 04/18/2013  . Pulmonary nodules 01/22/2013  . Cervical strain 09/14/2012  . Seborrheic dermatitis of scalp 08/18/2012  . History of breast cancer 10/30/2011  . Hip pain, right 07/02/2011  . Internal hemorrhoids without mention of complication 67/70/3403  . Internal hemorrhoids with other complication 52/48/1859  . ONYCHOMYCOSIS, BILATERAL 07/18/2010  . HYPERLIPIDEMIA 06/08/2010  . ACTINIC KERATOSIS 09/13/2009  . BAKER'S CYST 09/08/2009  . Osteoarthritis 09/06/2009  . CALF PAIN, LEFT 09/06/2009  . OCULAR MIGRAINE 08/11/2009  . KNEE PAIN, BILATERAL 03/09/2009  . DEPRESSIVE DISORDER 09/29/2008  . NECK PAIN 09/29/2008  . BACK PAIN, THORACIC REGION 06/28/2008  . UNSPECIFIED VITAMIN D DEFICIENCY 12/30/2007  . DYSPEPSIA&OTHER [place] DISORDERS FUNCTION STOMACH 12/25/2007  . DYSPHAGIA UNSPECIFIED 12/25/2007  . ANXIETY STATE, UNSPECIFIED 12/22/2007  . HIATAL HERNIA WITH REFLUX 12/22/2007  . Inflamed seborrheic keratosis 06/26/2007  . GOUT 06/10/2007  . Allergic rhinitis, cause unspecified 02/20/2007  . 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 OSTEOARTHRITIS 09/19/2006  . HX, PERSONAL, MALIGNANCY, BREAST 09/19/2006  . THYROID NODULE 09/18/2006  . HYPERTENSION 09/18/2006  . IBS 09/18/2006  . ARTHRITIS 09/18/2006    Sumner Boast., PT 10/06/2014, 4:56 PM  [address] [place], [place], [address] Phone: [phone]   Fax:  [phone]

## 2014-10-13 ENCOUNTER — Ambulatory Visit: Payer: Medicare Other | Admitting: Physical Therapy

## 2014-10-13 ENCOUNTER — Encounter: Payer: Self-pay | Admitting: Physical Therapy

## 2014-10-13 DIAGNOSIS — M25561 Pain in right knee: Secondary | ICD-10-CM

## 2014-10-13 DIAGNOSIS — R262 Difficulty in walking, not elsewhere classified: Secondary | ICD-10-CM

## 2014-10-13 NOTE — Therapy (Signed)
[address] [place], [place], [address] Phone: [phone]   Fax:  [phone]  Physical Therapy Treatment  Patient Details  Name: Amanda Joyce MRN: [id number] Date of Birth: Sep 25, 1940 Referring Provider:  Kathryne Hitch, MD  Encounter Date: 10/13/2014      PT End of Session - 10/13/14 1715    Visit Number 9   Date for PT Re-Evaluation 10/25/14   PT Start Time 1522   PT Stop Time 1630   PT Time Calculation (min) 68 min   Activity Tolerance Patient tolerated treatment well      Past Medical History  Diagnosis Date  . Hypertension   . Depression   . Gout   . Temporomandibular joint disorders, unspecified   . Personal history of malignant neoplasm of breast   . Breast cancer     Left   . Irritable bowel syndrome   . Nontoxic uninodular goiter   . Hyperlipemia   . Hiatal hernia   . GERD (gastroesophageal reflux disease)   . History of stress test >10 yrs. ago    in [place], [place] - no need for follow up  . Anxiety   . Arthritis     neck, shoulders, back, knees   . Osteoarthrosis, unspecified whether generalized or localized, unspecified site     Past Surgical History  Procedure Laterality Date  . Appendectomy    . Hernia repair    . Abdominal hysterectomy    . Thyroid surgery    . Breast lumpectomy      Left breast X2  . Eye surgery Bilateral     /w iol  . Total knee arthroplasty Right 08/04/2014    Procedure: RIGHT TOTAL KNEE ARTHROPLASTY;  Surgeon: Kathryne Hitch, MD;  Location: [facility];  Service: Orthopedics;  Laterality: Right;    There were no vitals filed for this visit.  Visit Diagnosis:  Right knee pain  Difficulty walking      Subjective Assessment - 10/13/14 1552    Subjective I stay stiff.   Currently in Pain? Yes   Pain Score 3    Pain Location Knee   Pain Orientation Right   Pain Descriptors / Indicators Tightness;Aching   Aggravating Factors  activity   Pain  Relieving Factors rest and pain med            [facility] PT Assessment - 10/13/14 0001    AROM   Overall AROM Comments AROM of the right knee 8-108 degrees but very stiff                     [facility] Adult PT Treatment/Exercise - 10/13/14 0001    Knee/Hip Exercises: Aerobic   Tread Mill NuStep [id number]   Knee/Hip Exercises: Machines for Strengthening   Cybex Knee Extension 5# 2x15   Cybex Knee Flexion 25# 2x15   Cybex Leg Press 20# and no weight, working on deeper flexion   Knee/Hip Exercises: Standing   Knee Flexion Right;5 sets   Terminal Knee Extension Right;5 sets   Other Standing Knee Exercises 5# hip abduction and extension   Knee/Hip Exercises: Supine   Short Arc Quad Sets 3 sets;10 reps   Short Arc Quad Sets Limitations 3#   Cryotherapy   Number Minutes Cryotherapy 15 Minutes   Cryotherapy Location Knee   Type of Cryotherapy Ice pack   Electrical Stimulation   Electrical Stimulation Location R knee   Electrical Stimulation Action IFC  Electrical Stimulation Parameters to tolerance   Electrical Stimulation Goals Pain   Manual Therapy   Passive ROM contract relax, flexion and extension                  PT Short Term Goals - 08/25/14 1649    PT SHORT TERM GOAL #1   Title independent with initial HEP   Time 2   Period Weeks   Status New           PT Long Term Goals - 10/06/14 1655    PT LONG TERM GOAL #1   Title decrease pain 50%   Status Partially Met   PT LONG TERM GOAL #2   Title increase AROM of the right knee to5-115 degrees flexion   Status Partially Met   PT LONG TERM GOAL #3   Title walk without assistive device 500 feet   Status Achieved   PT LONG TERM GOAL #4   Title go up and down stairs step over step   Status Partially Met               Plan - 10/13/14 1716    Clinical Impression Statement Very tight and stiff, reports that she is doing exercises at home, does not like stretches.   PT Next Visit Plan may hold  and have her try  on own due to high copay   Consulted and Agree with Plan of Care Patient        Problem List Patient Active Problem List   Diagnosis Date Noted  . DJD (degenerative joint disease) of knee 08/04/2014  . Viral bronchitis 03/30/2014  . Breast cancer, left breast 01/19/2014  . Insomnia 04/18/2013  . Pulmonary nodules 01/22/2013  . Cervical strain 09/14/2012  . Seborrheic dermatitis of scalp 08/18/2012  . History of breast cancer 10/30/2011  . Hip pain, right 07/02/2011  . Internal hemorrhoids without mention of complication 86/76/7209  . Internal hemorrhoids with other complication 47/01/6282  . ONYCHOMYCOSIS, BILATERAL 07/18/2010  . HYPERLIPIDEMIA 06/08/2010  . ACTINIC KERATOSIS 09/13/2009  . BAKER'S CYST 09/08/2009  . Osteoarthritis 09/06/2009  . CALF PAIN, LEFT 09/06/2009  . OCULAR MIGRAINE 08/11/2009  . KNEE PAIN, BILATERAL 03/09/2009  . DEPRESSIVE DISORDER 09/29/2008  . NECK PAIN 09/29/2008  . BACK PAIN, THORACIC REGION 06/28/2008  . UNSPECIFIED VITAMIN D DEFICIENCY 12/30/2007  . DYSPEPSIA&OTHER [place] DISORDERS FUNCTION STOMACH 12/25/2007  . DYSPHAGIA UNSPECIFIED 12/25/2007  . ANXIETY STATE, UNSPECIFIED 12/22/2007  . HIATAL HERNIA WITH REFLUX 12/22/2007  . Inflamed seborrheic keratosis 06/26/2007  . GOUT 06/10/2007  . Allergic rhinitis, cause unspecified 02/20/2007  . 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 OSTEOARTHRITIS 09/19/2006  . HX, PERSONAL, MALIGNANCY, BREAST 09/19/2006  . THYROID NODULE 09/18/2006  . HYPERTENSION 09/18/2006  . IBS 09/18/2006  . ARTHRITIS 09/18/2006    Sumner Boast., PT 10/13/2014, 5:19 PM  [address] [place], [place], [address] Phone: [phone]   Fax:  [phone]

## 2014-10-25 ENCOUNTER — Telehealth: Payer: Self-pay | Admitting: Family Medicine

## 2014-10-25 NOTE — Telephone Encounter (Signed)
Caller name: crystle Relation to pt: Call back number: [phone] Pharmacy:  Reason for call:   Requesting oxycodone rx.90 pills

## 2014-10-25 NOTE — Telephone Encounter (Signed)
Patient aware Rx ready for pick up.      KP 

## 2014-10-28 ENCOUNTER — Other Ambulatory Visit: Payer: Self-pay

## 2014-10-28 MED ORDER — OXYCODONE-ACETAMINOPHEN 5-325 MG PO TABS: 1.0000 | ORAL_TABLET | ORAL | Status: DC | PRN

## 2014-10-28 NOTE — Telephone Encounter (Signed)
Oxycodone left at check in.      KP

## 2014-11-03 ENCOUNTER — Telehealth: Payer: Self-pay | Admitting: Family Medicine

## 2014-11-03 NOTE — Telephone Encounter (Signed)
Caller name: alliana Relation to pt: Call back number: [phone] Pharmacy:  Reason for call:   Requesting a 90 day supply of prestiq.

## 2014-11-04 NOTE — Telephone Encounter (Signed)
Rx ordered. The Order number is [id number] wait 7-10 days before receiving this order.    KP

## 2014-11-11 NOTE — Telephone Encounter (Signed)
The Rx is in office and the patient has been called and made aware.      KP

## 2014-11-16 ENCOUNTER — Ambulatory Visit (INDEPENDENT_AMBULATORY_CARE_PROVIDER_SITE_OTHER): Payer: Medicare Other | Admitting: Family Medicine

## 2014-11-16 ENCOUNTER — Encounter: Payer: Self-pay | Admitting: Family Medicine

## 2014-11-16 VITALS — BP 134/70 | HR 58 | Temp 97.7°F | Resp 16 | Ht 69.0 in | Wt 169.0 lb

## 2014-11-16 DIAGNOSIS — R5383 Other fatigue: Secondary | ICD-10-CM | POA: Diagnosis not present

## 2014-11-16 DIAGNOSIS — G4700 Insomnia, unspecified: Secondary | ICD-10-CM

## 2014-11-16 DIAGNOSIS — N390 Urinary tract infection, site not specified: Secondary | ICD-10-CM

## 2014-11-16 DIAGNOSIS — R350 Frequency of micturition: Secondary | ICD-10-CM

## 2014-11-16 DIAGNOSIS — G894 Chronic pain syndrome: Secondary | ICD-10-CM

## 2014-11-16 LAB — POCT URINALYSIS DIPSTICK
Bilirubin, UA: NEGATIVE
GLUCOSE UA: NEGATIVE
KETONES UA: NEGATIVE
NITRITE UA: POSITIVE
PH UA: 6
SPEC GRAV UA: 1.025
Urobilinogen, UA: NEGATIVE

## 2014-11-16 MED ORDER — ZOLPIDEM TARTRATE 5 MG PO TABS: 5.0000 mg | ORAL_TABLET | Freq: Every evening | ORAL | Status: DC | PRN

## 2014-11-16 MED ORDER — CIPROFLOXACIN HCL 250 MG PO TABS: 250.0000 mg | ORAL_TABLET | Freq: Two times a day (BID) | ORAL | Status: DC

## 2014-11-16 MED ORDER — OXYCODONE-ACETAMINOPHEN 5-325 MG PO TABS: 1.0000 | ORAL_TABLET | ORAL | Status: DC | PRN

## 2014-11-16 NOTE — Progress Notes (Signed)
Pre visit review using our clinic review tool, if applicable. No additional management support is needed unless otherwise documented below in the visit note. 

## 2014-11-16 NOTE — Patient Instructions (Signed)

## 2014-11-16 NOTE — Progress Notes (Signed)
Patient ID: Amanda Joyce, female    DOB: [birth year]  Age: 74 y.o. MRN: [id number]    Subjective:  Subjective HPI TAIANA TEMKIN presents for f/u chronic pain.  She needs a pain med refill She is also c/o dysuria and frequency x several days.  No fever, abd pain.   She c/o severe fatigue and inc stress --- she is esp upset over the fatigue and frustrated that she can not do what she used to.  Review of Systems  Constitutional: Positive for fatigue. Negative for diaphoresis, appetite change and unexpected weight change.  Eyes: Negative for pain, redness and visual disturbance.  Respiratory: Negative for cough, chest tightness, shortness of breath and wheezing.   Cardiovascular: Negative for chest pain, palpitations and leg swelling.  Endocrine: Negative for cold intolerance, heat intolerance, polydipsia, polyphagia and polyuria.  Genitourinary: Positive for dysuria and frequency. Negative for difficulty urinating.  Neurological: Negative for dizziness, light-headedness, numbness and headaches.    History Past Medical History  Diagnosis Date  . Hypertension   . Depression   . Gout   . Temporomandibular joint disorders, unspecified   . Personal history of malignant neoplasm of breast   . Breast cancer     Left   . Irritable bowel syndrome   . Nontoxic uninodular goiter   . Hyperlipemia   . Hiatal hernia   . GERD (gastroesophageal reflux disease)   . History of stress test >10 yrs. ago    in [place], [place] - no need for follow up  . Anxiety   . Arthritis     neck, shoulders, back, knees   . Osteoarthrosis, unspecified whether generalized or localized, unspecified site     She has past surgical history that includes Appendectomy; Hernia repair; Abdominal hysterectomy; Thyroid surgery; Breast lumpectomy; Eye surgery (Bilateral); and Total knee arthroplasty (Right, 08/04/2014).   Her family history includes Alcohol abuse in her paternal uncle; Coronary artery disease in her father;  Liver cancer in her paternal uncle; Other in her mother. There is no history of Colon cancer.She reports that she has never smoked. She has never used smokeless tobacco. She reports that she does not drink alcohol or use illicit drugs.  Current Outpatient Prescriptions on File Prior to Visit  Medication Sig Dispense Refill  . atenolol (TENORMIN) 50 MG tablet Take 1 tablet (50 mg total) by mouth 2 (two) times daily. 180 tablet 3  . Azelastine-Fluticasone 137-50 MCG/ACT SUSP Place 1 spray into the nose 2 (two) times daily as needed.    Marland Kitchen b complex vitamins capsule Take 1 capsule by mouth daily.    . Biotin 5000 MCG CAPS Take 5,000 mcg by mouth daily.    . Cholecalciferol (VITAMIN D3) 2000 UNITS capsule Take 4,000 Units by mouth daily.    Marland Kitchen desvenlafaxine (PRISTIQ) 100 MG 24 hr tablet Take 100 mg by mouth daily.    Marland Kitchen diltiazem (CARDIZEM CD) 240 MG 24 hr capsule Take 1 capsule (240 mg total) by mouth daily. (Patient taking differently: Take 240 mg by mouth every morning. ) 90 capsule 3  . pravastatin (PRAVACHOL) 20 MG tablet Take 1 tablet (20 mg total) by mouth daily. (Patient taking differently: Take 20 mg by mouth at bedtime. ) 90 tablet 3  . quinapril (ACCUPRIL) 40 MG tablet Take 1 tablet (40 mg total) by mouth daily. 90 tablet 3  . enoxaparin (LOVENOX) 30 MG/0.3ML injection Inject 0.3 mLs (30 mg total) into the skin every 12 (twelve) hours. (Patient not taking: Reported  on 11/16/2014) 20 Syringe 0  . ipratropium (ATROVENT) 0.06 % nasal spray Place 2 sprays into the nose 3 (three) times daily as needed. (Patient not taking: Reported on 11/16/2014) 15 mL 5  . methocarbamol (ROBAXIN) 500 MG tablet Take 1 tablet (500 mg total) by mouth 4 (four) times daily. (Patient not taking: Reported on 11/16/2014) 90 tablet 0  . ondansetron (ZOFRAN) 4 MG tablet Take 1 tablet (4 mg total) by mouth every 8 (eight) hours as needed for nausea or vomiting. (Patient not taking: Reported on 11/16/2014) 40 tablet 0  .  triamcinolone cream (KENALOG) 0.1 % Apply 1 application topically 2 (two) times daily. (Patient not taking: Reported on 07/23/2014) 30 g 0   No current facility-administered medications on file prior to visit.     Objective:  Objective Physical Exam  Constitutional: She is oriented to person, place, and time. She appears well-developed and well-nourished.  HENT:  Head: Normocephalic and atraumatic.  Eyes: Conjunctivae and EOM are normal.  Neck: Normal range of motion. Neck supple. No JVD present. Carotid bruit is not present. No thyromegaly present.  Cardiovascular: Normal rate, regular rhythm and normal heart sounds.   No murmur heard. Pulmonary/Chest: Effort normal and breath sounds normal. No respiratory distress. She has no wheezes. She has no rales. She exhibits no tenderness.  Musculoskeletal: She exhibits no edema.  Neurological: She is alert and oriented to person, place, and time.  Psychiatric: She has a normal mood and affect. Her behavior is normal.   BP 134/70 mmHg  Pulse 58  Temp(Src) 97.7 F (36.5 C) (Oral)  Resp 16  Ht 5\' 9"  (1.753 m)  Wt 169 lb (76.658 kg)  BMI 24.95 kg/m2  SpO2 98% Wt Readings from Last 3 Encounters:  11/16/14 169 lb (76.658 kg)  08/04/14 175 lb (79.379 kg)  07/23/14 175 lb 14.4 oz (79.788 kg)     Lab Results  Component Value Date   WBC 17.3* 08/06/2014   HGB 10.0* 08/06/2014   HCT 30.1* 08/06/2014   PLT 216 08/06/2014   GLUCOSE 142* 08/06/2014   CHOL 165 02/10/2013   TRIG 140.0 02/10/2013   HDL 42.10 02/10/2013   LDLDIRECT 194.5 10/04/2010   LDLCALC 95 02/10/2013   ALT 16 07/23/2014   AST 22 07/23/2014   NA 138 08/06/2014   K 4.1 08/06/2014   CL 106 08/06/2014   CREATININE 0.67 08/06/2014   BUN 14 08/06/2014   CO2 28 08/06/2014   TSH 1.66 07/03/2011   INR 1.04 07/23/2014   HGBA1C 6.1 07/03/2011    No results found.   Assessment & Plan:  Plan I have discontinued Ms. Ure ALPRAZolam, azithromycin, zolpidem, and  bisacodyl. I am also having her start on ciprofloxacin and zolpidem. Additionally, I am having her maintain her triamcinolone cream, Azelastine-Fluticasone, Vitamin D3, ipratropium, atenolol, diltiazem, pravastatin, quinapril, b complex vitamins, desvenlafaxine, Biotin, ondansetron, methocarbamol, enoxaparin, ferrous sulfate, and oxyCODONE-acetaminophen.  Meds ordered this encounter  Medications  . ferrous sulfate 325 (65 FE) MG tablet    Sig: Take 325 mg by mouth daily with breakfast.  . ciprofloxacin (CIPRO) 250 MG tablet    Sig: Take 1 tablet (250 mg total) by mouth 2 (two) times daily.    Dispense:  6 tablet    Refill:  0  . zolpidem (AMBIEN) 5 MG tablet    Sig: Take 1 tablet (5 mg total) by mouth at bedtime as needed for sleep.    Dispense:  15 tablet    Refill:  1  .  oxyCODONE-acetaminophen (ROXICET) 5-325 MG per tablet    Sig: Take 1-2 tablets by mouth every 4 (four) hours as needed.    Dispense:  90 tablet    Refill:  0    Problem List Items Addressed This Visit    Insomnia   Relevant Medications   zolpidem (AMBIEN) 5 MG tablet    Other Visit Diagnoses    Other fatigue    -  Primary    Relevant Orders    Basic metabolic panel    CBC with Differential/Platelet    Hepatic function panel    Lipid panel    TSH    Vitamin B12    Urinary frequency        Relevant Orders    POCT Urinalysis Dipstick (Completed)    Urine Culture    Urinary tract infection without hematuria, site unspecified        Relevant Medications    ciprofloxacin (CIPRO) 250 MG tablet    Other Relevant Orders    Urine Culture    Chronic pain syndrome        Relevant Medications    oxyCODONE-acetaminophen (ROXICET) 5-325 MG per tablet       Follow-up: Return in about 6 months (around 05/19/2015), or if symptoms worsen or fail to improve.  Garnet Koyanagi, DO

## 2014-11-17 ENCOUNTER — Other Ambulatory Visit (INDEPENDENT_AMBULATORY_CARE_PROVIDER_SITE_OTHER): Payer: Medicare Other

## 2014-11-17 DIAGNOSIS — R5383 Other fatigue: Secondary | ICD-10-CM | POA: Diagnosis not present

## 2014-11-17 LAB — CBC WITH DIFFERENTIAL/PLATELET
Basophils Absolute: 0 10*3/uL (ref 0.0–0.1)
Basophils Relative: 0.4 % (ref 0.0–3.0)
Eosinophils Absolute: 0.3 10*3/uL (ref 0.0–0.7)
Eosinophils Relative: 3.5 % (ref 0.0–5.0)
HEMATOCRIT: 43.6 % (ref 36.0–46.0)
Hemoglobin: 14.7 g/dL (ref 12.0–15.0)
LYMPHS ABS: 3.2 10*3/uL (ref 0.7–4.0)
Lymphocytes Relative: 33.8 % (ref 12.0–46.0)
MCHC: 33.6 g/dL (ref 30.0–36.0)
MCV: 86 fl (ref 78.0–100.0)
MONOS PCT: 7 % (ref 3.0–12.0)
Monocytes Absolute: 0.7 10*3/uL (ref 0.1–1.0)
Neutro Abs: 5.3 10*3/uL (ref 1.4–7.7)
Neutrophils Relative %: 55.3 % (ref 43.0–77.0)
PLATELETS: 346 10*3/uL (ref 150.0–400.0)
RBC: 5.07 Mil/uL (ref 3.87–5.11)
RDW: 14.8 % (ref 11.5–15.5)
WBC: 9.6 10*3/uL (ref 4.0–10.5)

## 2014-11-17 LAB — HEPATIC FUNCTION PANEL
ALBUMIN: 4.3 g/dL (ref 3.5–5.2)
ALT: 12 U/L (ref 0–35)
AST: 15 U/L (ref 0–37)
Alkaline Phosphatase: 56 U/L (ref 39–117)
BILIRUBIN DIRECT: 0.1 mg/dL (ref 0.0–0.3)
TOTAL PROTEIN: 7.2 g/dL (ref 6.0–8.3)
Total Bilirubin: 0.7 mg/dL (ref 0.2–1.2)

## 2014-11-17 LAB — LIPID PANEL
Cholesterol: 167 mg/dL (ref 0–200)
HDL: 42.5 mg/dL (ref >39.00)
LDL Cholesterol: 94 mg/dL (ref 0–99)
NONHDL: 124.5
Total CHOL/HDL Ratio: 4
Triglycerides: 154 mg/dL — ABNORMAL HIGH (ref 0.0–149.0)
VLDL: 30.8 mg/dL (ref 0.0–40.0)

## 2014-11-17 LAB — BASIC METABOLIC PANEL
BUN: 16 mg/dL (ref 6–23)
CO2: 28 mEq/L (ref 19–32)
Calcium: 10.3 mg/dL (ref 8.4–10.5)
Chloride: 102 mEq/L (ref 96–112)
Creatinine, Ser: 0.85 mg/dL (ref 0.40–1.20)
GFR: 69.43 mL/min (ref >60.00)
Glucose, Bld: 108 mg/dL — ABNORMAL HIGH (ref 70–99)
Potassium: 3.6 mEq/L (ref 3.5–5.1)
SODIUM: 140 meq/L (ref 135–145)

## 2014-11-17 LAB — VITAMIN B12: VITAMIN B 12: 346 pg/mL (ref 211–911)

## 2014-11-17 LAB — TSH: TSH: 2.51 u[IU]/mL (ref 0.35–4.50)

## 2014-11-19 LAB — URINE CULTURE

## 2014-11-29 ENCOUNTER — Telehealth: Payer: Self-pay | Admitting: Family Medicine

## 2014-11-29 NOTE — Telephone Encounter (Signed)
Relation to pt: self  Call back number: [phone]  Reason for call:  Patient inquiring about lab results taken 11/17/2014

## 2014-11-29 NOTE — Telephone Encounter (Signed)
Notified pt per lab note of 11/21/14.

## 2014-12-13 ENCOUNTER — Encounter: Payer: Self-pay | Admitting: Family Medicine

## 2014-12-13 ENCOUNTER — Ambulatory Visit (INDEPENDENT_AMBULATORY_CARE_PROVIDER_SITE_OTHER): Payer: Medicare Other | Admitting: Family Medicine

## 2014-12-13 VITALS — BP 130/84 | HR 63 | Temp 98.2°F | Resp 18 | Wt 174.0 lb

## 2014-12-13 DIAGNOSIS — R229 Localized swelling, mass and lump, unspecified: Secondary | ICD-10-CM

## 2014-12-13 DIAGNOSIS — G894 Chronic pain syndrome: Secondary | ICD-10-CM

## 2014-12-13 DIAGNOSIS — L570 Actinic keratosis: Secondary | ICD-10-CM | POA: Diagnosis not present

## 2014-12-13 DIAGNOSIS — R220 Localized swelling, mass and lump, head: Secondary | ICD-10-CM

## 2014-12-13 MED ORDER — OXYCODONE-ACETAMINOPHEN 5-325 MG PO TABS: 1.0000 | ORAL_TABLET | ORAL | Status: DC | PRN

## 2014-12-13 NOTE — Progress Notes (Signed)
Patient ID: Amanda Joyce, female    DOB: 1940-07-02  Age: 74 y.o. MRN: [id number]    Subjective:  Subjective HPI Amanda Joyce presents with c/o sm hard lesion on top of scalp.  It is getting caught in brush and gets easily irritated.    Review of Systems  Constitutional: Negative for diaphoresis, appetite change, fatigue and unexpected weight change.  Eyes: Negative for pain, redness and visual disturbance.  Respiratory: Negative for cough, chest tightness, shortness of breath and wheezing.   Cardiovascular: Negative for chest pain, palpitations and leg swelling.  Endocrine: Negative for cold intolerance, heat intolerance, polydipsia, polyphagia and polyuria.  Genitourinary: Negative for dysuria, frequency and difficulty urinating.  Skin:       Hard lesion on scalp  Neurological: Negative for dizziness, light-headedness, numbness and headaches.    History Past Medical History  Diagnosis Date  . Hypertension   . Depression   . Gout   . Temporomandibular joint disorders, unspecified   . Personal history of malignant neoplasm of breast   . Breast cancer     Left   . Irritable bowel syndrome   . Nontoxic uninodular goiter   . Hyperlipemia   . Hiatal hernia   . GERD (gastroesophageal reflux disease)   . History of stress test >10 yrs. ago    in [place], [place] - no need for follow up  . Anxiety   . Arthritis     neck, shoulders, back, knees   . Osteoarthrosis, unspecified whether generalized or localized, unspecified site     She has past surgical history that includes Appendectomy; Hernia repair; Abdominal hysterectomy; Thyroid surgery; Breast lumpectomy; Eye surgery (Bilateral); and Total knee arthroplasty (Right, 08/04/2014).   Her family history includes Alcohol abuse in her paternal uncle; Coronary artery disease in her father; Liver cancer in her paternal uncle; Other in her mother. There is no history of Colon cancer.She reports that she has never smoked. She has never  used smokeless tobacco. She reports that she does not drink alcohol or use illicit drugs.  Current Outpatient Prescriptions on File Prior to Visit  Medication Sig Dispense Refill  . atenolol (TENORMIN) 50 MG tablet Take 1 tablet (50 mg total) by mouth 2 (two) times daily. 180 tablet 3  . Azelastine-Fluticasone 137-50 MCG/ACT SUSP Place 1 spray into the nose 2 (two) times daily as needed.    Marland Kitchen b complex vitamins capsule Take 1 capsule by mouth daily.    . Biotin 5000 MCG CAPS Take 5,000 mcg by mouth daily.    . Cholecalciferol (VITAMIN D3) 2000 UNITS capsule Take 4,000 Units by mouth daily.    Marland Kitchen desvenlafaxine (PRISTIQ) 100 MG 24 hr tablet Take 100 mg by mouth daily.    Marland Kitchen diltiazem (CARDIZEM CD) 240 MG 24 hr capsule Take 1 capsule (240 mg total) by mouth daily. (Patient taking differently: Take 240 mg by mouth every morning. ) 90 capsule 3  . enoxaparin (LOVENOX) 30 MG/0.3ML injection Inject 0.3 mLs (30 mg total) into the skin every 12 (twelve) hours. 20 Syringe 0  . ferrous sulfate 325 (65 FE) MG tablet Take 325 mg by mouth daily with breakfast.    . ipratropium (ATROVENT) 0.06 % nasal spray Place 2 sprays into the nose 3 (three) times daily as needed. 15 mL 5  . methocarbamol (ROBAXIN) 500 MG tablet Take 1 tablet (500 mg total) by mouth 4 (four) times daily. 90 tablet 0  . ondansetron (ZOFRAN) 4 MG tablet Take 1 tablet (  4 mg total) by mouth every 8 (eight) hours as needed for nausea or vomiting. 40 tablet 0  . pravastatin (PRAVACHOL) 20 MG tablet Take 1 tablet (20 mg total) by mouth daily. (Patient taking differently: Take 20 mg by mouth at bedtime. ) 90 tablet 3  . quinapril (ACCUPRIL) 40 MG tablet Take 1 tablet (40 mg total) by mouth daily. 90 tablet 3  . triamcinolone cream (KENALOG) 0.1 % Apply 1 application topically 2 (two) times daily. 30 g 0  . zolpidem (AMBIEN) 5 MG tablet Take 1 tablet (5 mg total) by mouth at bedtime as needed for sleep. 15 tablet 1   No current facility-administered  medications on file prior to visit.     Objective:  Objective Physical Exam  Constitutional: She appears well-developed and well-nourished.  HENT:  Head: Normocephalic.  Eyes: EOM are normal.  Neck: No JVD present. Carotid bruit is not present. No thyromegaly present.  Psychiatric: She has a normal mood and affect.  procedure:  Area on top of scalp cleaned with betadine and 1cc xylocaine without epi for anesthesia Shave biopsy done with dermablade on 1/4 cm keratosis Sent for biopsy Silver nitrate used to stop bleeding  BP 130/84 mmHg  Pulse 63  Temp(Src) 98.2 F (36.8 C) (Oral)  Resp 18  Wt 174 lb (78.926 kg)  SpO2 98% Wt Readings from Last 3 Encounters:  12/13/14 174 lb (78.926 kg)  11/16/14 169 lb (76.658 kg)  08/04/14 175 lb (79.379 kg)     Lab Results  Component Value Date   WBC 9.6 11/17/2014   HGB 14.7 11/17/2014   HCT 43.6 11/17/2014   PLT 346.0 11/17/2014   GLUCOSE 108* 11/17/2014   CHOL 167 11/17/2014   TRIG 154.0* 11/17/2014   HDL 42.50 11/17/2014   LDLDIRECT 194.5 10/04/2010   LDLCALC 94 11/17/2014   ALT 12 11/17/2014   AST 15 11/17/2014   NA 140 11/17/2014   K 3.6 11/17/2014   CL 102 11/17/2014   CREATININE 0.85 11/17/2014   BUN 16 11/17/2014   CO2 28 11/17/2014   TSH 2.51 11/17/2014   INR 1.04 07/23/2014   HGBA1C 6.1 07/03/2011    No results found.   Assessment & Plan:  Plan I have discontinued Ms. Crepeau ciprofloxacin. I am also having her maintain her triamcinolone cream, Azelastine-Fluticasone, Vitamin D3, ipratropium, atenolol, diltiazem, pravastatin, quinapril, b complex vitamins, desvenlafaxine, Biotin, ondansetron, methocarbamol, enoxaparin, ferrous sulfate, zolpidem, and oxyCODONE-acetaminophen.  Meds ordered this encounter  Medications  . oxyCODONE-acetaminophen (ROXICET) 5-325 MG per tablet    Sig: Take 1-2 tablets by mouth every 4 (four) hours as needed.    Dispense:  90 tablet    Refill:  0    Problem List Items Addressed  This Visit    None    Visit Diagnoses    Chronic pain syndrome    -  Primary    Relevant Medications    oxyCODONE-acetaminophen (ROXICET) 5-325 MG per tablet    Mass of scalp        Keratosis        Relevant Orders    Dermatology pathology       Follow-up: Return if symptoms worsen or fail to improve.  Garnet Koyanagi, DO

## 2014-12-13 NOTE — Patient Instructions (Signed)
Keep area of biopsy dry for 24 hours then you may shower as usual  Call with any questions or concerns.

## 2014-12-13 NOTE — Progress Notes (Signed)
Pre visit review using our clinic review tool, if applicable. No additional management support is needed unless otherwise documented below in the visit note. 

## 2014-12-15 ENCOUNTER — Other Ambulatory Visit: Payer: Self-pay | Admitting: Oncology

## 2014-12-15 ENCOUNTER — Other Ambulatory Visit: Payer: Self-pay

## 2014-12-15 DIAGNOSIS — Z1231 Encounter for screening mammogram for malignant neoplasm of breast: Secondary | ICD-10-CM

## 2014-12-15 DIAGNOSIS — Z853 Personal history of malignant neoplasm of breast: Secondary | ICD-10-CM

## 2015-01-13 ENCOUNTER — Telehealth: Payer: Self-pay | Admitting: Family Medicine

## 2015-01-13 DIAGNOSIS — L309 Dermatitis, unspecified: Secondary | ICD-10-CM

## 2015-01-13 DIAGNOSIS — G894 Chronic pain syndrome: Secondary | ICD-10-CM

## 2015-01-13 NOTE — Telephone Encounter (Signed)
Relation to PY:PPJK Call back number: [phone]  Pharmacy: [facility] [address] [place]. [phone] (Phone) [phone] (Fax)         Reason for call:  Patient requesting a 90 day supply of oxyCODONE-acetaminophen (ROXICET) 5-325 MG per tablet and requesting a refill triamcinolone cream (KENALOG) 0.1 please send cream to retail pharmacy.

## 2015-01-13 NOTE — Telephone Encounter (Signed)
Last seen 12/13/14 and filled 12/13/14 #90 UDS 02/10/13 low risk.   KP

## 2015-01-13 NOTE — Telephone Encounter (Signed)
Refill x1 

## 2015-01-14 MED ORDER — TRIAMCINOLONE ACETONIDE 0.1 % EX CREA: 1.0000 "application " | TOPICAL_CREAM | Freq: Two times a day (BID) | CUTANEOUS | Status: DC

## 2015-01-14 MED ORDER — OXYCODONE-ACETAMINOPHEN 5-325 MG PO TABS: 1.0000 | ORAL_TABLET | ORAL | Status: DC | PRN

## 2015-01-14 NOTE — Telephone Encounter (Signed)
Message left advising Rx ready for pick up.     KP 

## 2015-01-18 ENCOUNTER — Other Ambulatory Visit (HOSPITAL_BASED_OUTPATIENT_CLINIC_OR_DEPARTMENT_OTHER): Payer: Medicare Other

## 2015-01-18 DIAGNOSIS — C50912 Malignant neoplasm of unspecified site of left female breast: Secondary | ICD-10-CM

## 2015-01-18 DIAGNOSIS — Z853 Personal history of malignant neoplasm of breast: Secondary | ICD-10-CM | POA: Diagnosis not present

## 2015-01-18 LAB — CBC WITH DIFFERENTIAL/PLATELET
BASO%: 1.2 % (ref 0.0–2.0)
Basophils Absolute: 0.1 10*3/uL (ref 0.0–0.1)
EOS%: 2.6 % (ref 0.0–7.0)
Eosinophils Absolute: 0.2 10*3/uL (ref 0.0–0.5)
HCT: 44.1 % (ref 34.8–46.6)
HGB: 14.5 g/dL (ref 11.6–15.9)
LYMPH%: 27.8 % (ref 14.0–49.7)
MCH: 29.2 pg (ref 25.1–34.0)
MCHC: 32.9 g/dL (ref 31.5–36.0)
MCV: 88.8 fL (ref 79.5–101.0)
MONO#: 0.8 10*3/uL (ref 0.1–0.9)
MONO%: 9.4 % (ref 0.0–14.0)
NEUT#: 4.8 10*3/uL (ref 1.5–6.5)
NEUT%: 59 % (ref 38.4–76.8)
Platelets: 343 10*3/uL (ref 145–400)
RBC: 4.97 10*6/uL (ref 3.70–5.45)
RDW: 14.6 % — AB (ref 11.2–14.5)
WBC: 8.1 10*3/uL (ref 3.9–10.3)
lymph#: 2.3 10*3/uL (ref 0.9–3.3)

## 2015-01-18 LAB — COMPREHENSIVE METABOLIC PANEL (CC13)
ALT: 11 U/L (ref 0–55)
AST: 16 U/L (ref 5–34)
Albumin: 4.2 g/dL (ref 3.5–5.0)
Alkaline Phosphatase: 65 U/L (ref 40–150)
Anion Gap: 7 mEq/L (ref 3–11)
BUN: 17.4 mg/dL (ref 7.0–26.0)
CALCIUM: 10.5 mg/dL — AB (ref 8.4–10.4)
CHLORIDE: 105 meq/L (ref 98–109)
CO2: 30 mEq/L — ABNORMAL HIGH (ref 22–29)
CREATININE: 0.9 mg/dL (ref 0.6–1.1)
EGFR: 63 mL/min/{1.73_m2} — ABNORMAL LOW (ref >90)
GLUCOSE: 83 mg/dL (ref 70–140)
Potassium: 4.2 mEq/L (ref 3.5–5.1)
Sodium: 142 mEq/L (ref 136–145)
Total Bilirubin: 0.62 mg/dL (ref 0.20–1.20)
Total Protein: 7.2 g/dL (ref 6.4–8.3)

## 2015-01-25 ENCOUNTER — Ambulatory Visit (HOSPITAL_BASED_OUTPATIENT_CLINIC_OR_DEPARTMENT_OTHER): Payer: Medicare Other | Admitting: Oncology

## 2015-01-25 ENCOUNTER — Ambulatory Visit
Admission: RE | Admit: 2015-01-25 | Discharge: 2015-01-25 | Disposition: A | Discharge status: Home / Self Care | Payer: Medicare Other | Source: Ambulatory Visit

## 2015-01-25 DIAGNOSIS — C50912 Malignant neoplasm of unspecified site of left female breast: Secondary | ICD-10-CM

## 2015-01-25 DIAGNOSIS — Z853 Personal history of malignant neoplasm of breast: Secondary | ICD-10-CM

## 2015-01-25 DIAGNOSIS — Z1231 Encounter for screening mammogram for malignant neoplasm of breast: Secondary | ICD-10-CM

## 2015-01-25 NOTE — Progress Notes (Signed)
ID: CHRISTY EHRSAM   DOB: 06/02/1940  MR#: [id number]  GGE#:[id number]  PCP: Garnet Koyanagi, DO GYN: SU:  OTHER MD:   HISTORY OF PRESENT ILLNESS: From the original intake note:  Adaora palpated a lump in her left breast in early 2006.  This was evaluated with mammography which showed indeed a suspicious lesion (I do not have any radiology records) and lead to biopsy of the left breast mass on May 23, 2004.    The pathology report (Excision No. [id number] at [facility] in [place], [place]) showed an infiltrating ductal carcinoma, grade 2, with a small percentage in situ component.  No evidence of lymphovascular invasion.  Strongly positive for ER and PR, the Hercept testing being 1+ and the HER-2 by FISH being in the equivocal range with a ratio of 1.9.    With this information, the patient proceeded to definitive surgical removal of the tumor under Dr. Sinclair Ship on June 15, 2004.  This was a left lumpectomy and axillary lymph node dissection.  The final pathology ([id number]) showed a 4 cm infiltrating ductal carcinoma, grade 2, with probable lymphatic invasion, but more importantly, with 3 out of 16 lymph nodes involved, positive for metastatic carcinoma, including focal extracapsular extension.  There was a positive margin (inferior) and this was cleared by further surgery on June 26, 2004 ([id number]).  The patient was then staged with no evidence of metastatic disease and was treated with dose-dense Cytoxan and Adriamycin followed by dose-dense Taxol, all by standard protocol to the best of my ability to tell from the records and from the patient.  It was followed by radiation (I do not have the radiation records) completed in October of 2006.  Her subsequent history is as detailed below    INTERVAL HISTORY: Teegan returns for followup of her breast cancer. Since her last visit here she had a right total knee replacement. She is not very happy with  the results. She can only bend the leg to 90 despite her having done "everything they told me to do". She still getting rehabilitation but tells me they're going to "break" the knees so it can been back a little bit more when she next sees Dr. Percell Miller.  REVIEW OF SYSTEMS: Aside from this issue her review of systems is unremarkable. She has back and joint pain which is not more persistent or intense than before. She feels anxious, but not depressed. She still has hot flashes. She is on desvenlafaxine which should help with this. Otherwise a detailed review of systems today was stable  PAST MEDICAL HISTORY: Past Medical History  Diagnosis Date  . Hypertension   . Depression   . Gout   . Temporomandibular joint disorders, unspecified   . Personal history of malignant neoplasm of breast   . Breast cancer     Left   . Irritable bowel syndrome   . Nontoxic uninodular goiter   . Hyperlipemia   . Hiatal hernia   . GERD (gastroesophageal reflux disease)   . History of stress test >10 yrs. ago    in [place], [place] - no need for follow up  . Anxiety   . Arthritis     neck, shoulders, back, knees   . Osteoarthrosis, unspecified whether generalized or localized, unspecified site   Significant in addition to the above for the patient being status post right thyroidectomy at age 75.  She also underwent ultrasound-guided thyroid nodule fine-needle aspiration on Sep 13, 2004.  I do not have  the pathology report from that, but according to the patient, everything was benign. Other medical problems include status post appendectomy, status post right salpingo-oophorectomy in the patient's mid-twenties for an ovarian cyst, status post left herniorrhaphy, history of irritable bowel syndrome and a history of hypertension.    PAST SURGICAL HISTORY: Past Surgical History  Procedure Laterality Date  . Appendectomy    . Hernia repair    . Abdominal hysterectomy    . Thyroid surgery    . Breast lumpectomy       Left breast X2  . Eye surgery Bilateral     /w iol  . Total knee arthroplasty Right 08/04/2014    Procedure: RIGHT TOTAL KNEE ARTHROPLASTY;  Surgeon: Kathryne Hitch, MD;  Location: [facility];  Service: Orthopedics;  Laterality: Right;    FAMILY HISTORY Family History  Problem Relation Age of Onset  . Coronary artery disease Father   . Liver cancer Paternal Uncle   . Alcohol abuse Paternal Uncle   . Colon cancer Neg Hx   . Other Mother     deceased from brain tumor  The patient's father died from a myocardial infarction at the age of 54.  The patient's mother died from "brain cancer" when the patient was 74 years old. She is an only child.  GYNECOLOGIC HISTORY: She is GX, P2.  First pregnancy to term at age 76.  Last menstrual period more than 15 years ago. She took hormones all of those 15 years until her recent diagnosis of breast cancer.    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  She has been "happily divorced" x 2.  Her son, [name], lives in [place].  He buys and sells cars.  The patient moved to this area to live with him.  Her daughter, [name], lives with her husband, Tanda Rockers, in [place], where the patient's 4 granddaughters reside.  [name] has no children.  She and [name] do share two dogs (Mini-Australian Shepherds which they used to raise) and two cats.  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     ADVANCED DIRECTIVES: Not in place  HEALTH MAINTENANCE: Social History  Substance Use Topics  . Smoking status: Never Smoker   . Smokeless tobacco: Never Used  . Alcohol Use: No     Colonoscopy:  PAP:  Bone density:  Lipid panel:  Allergies  Allergen Reactions  . Clarithromycin     REACTION: NIGHTMARES  . Influenza Virus Vaccine Split Nausea And Vomiting  . Penicillins     REACTION: ITCHING  . Soma [Carisoprodol] Other (See Comments)    nightmares    Current  Outpatient Prescriptions  Medication Sig Dispense Refill  . atenolol (TENORMIN) 50 MG tablet Take 1 tablet (50 mg total) by mouth 2 (two) times daily. 180 tablet 3  . Azelastine-Fluticasone 137-50 MCG/ACT SUSP Place 1 spray into the nose 2 (two) times daily as needed.    Marland Kitchen b complex vitamins capsule Take 1 capsule by mouth daily.    . Biotin 5000 MCG CAPS Take 5,000 mcg by mouth daily.    . Cholecalciferol (VITAMIN D3) 2000 UNITS capsule Take 4,000 Units by mouth daily.    Marland Kitchen desvenlafaxine (PRISTIQ) 100 MG 24 hr tablet Take 100 mg by mouth daily.    Marland Kitchen diltiazem (CARDIZEM CD) 240 MG 24 hr capsule Take 1 capsule (240 mg total) by mouth daily. (Patient taking differently: Take 240 mg  by mouth every morning. ) 90 capsule 3  . enoxaparin (LOVENOX) 30 MG/0.3ML injection Inject 0.3 mLs (30 mg total) into the skin every 12 (twelve) hours. 20 Syringe 0  . ferrous sulfate 325 (65 FE) MG tablet Take 325 mg by mouth daily with breakfast.    . ipratropium (ATROVENT) 0.06 % nasal spray Place 2 sprays into the nose 3 (three) times daily as needed. 15 mL 5  . methocarbamol (ROBAXIN) 500 MG tablet Take 1 tablet (500 mg total) by mouth 4 (four) times daily. 90 tablet 0  . ondansetron (ZOFRAN) 4 MG tablet Take 1 tablet (4 mg total) by mouth every 8 (eight) hours as needed for nausea or vomiting. 40 tablet 0  . oxyCODONE-acetaminophen (ROXICET) 5-325 MG per tablet Take 1-2 tablets by mouth every 4 (four) hours as needed. 90 tablet 0  . pravastatin (PRAVACHOL) 20 MG tablet Take 1 tablet (20 mg total) by mouth daily. (Patient taking differently: Take 20 mg by mouth at bedtime. ) 90 tablet 3  . quinapril (ACCUPRIL) 40 MG tablet Take 1 tablet (40 mg total) by mouth daily. 90 tablet 3  . triamcinolone cream (KENALOG) 0.1 % Apply 1 application topically 2 (two) times daily. 30 g 5  . zolpidem (AMBIEN) 5 MG tablet Take 1 tablet (5 mg total) by mouth at bedtime as needed for sleep. 15 tablet 1   No current  facility-administered medications for this visit.    OBJECTIVE: Middle-aged white woman who appears stated age 65 vitals are separately recorded by the escort's      ECOG FS: 1  Sclerae unicteric, pupils round and equal Oropharynx clear and moist-- no thrush or other lesions No cervical or supraclavicular adenopathy Lungs no rales or rhonchi Heart regular rate and rhythm Abd soft, nontender, positive bowel sounds MSK no focal spinal tenderness, no upper extremity lymphedema; status post right total knee replacement, with the incision having healed nicely, minimal swelling, no erythema. There is limited range of motion as pointed out by the patient (not below 90) Neuro: nonfocal, well oriented, appropriate affect Breasts: The right breast is unremarkable. The left breast is status post lumpectomy and radiation. There is no evidence of local recurrence. The left axilla is benign.    LAB RESULTS: Lab Results  Component Value Date   WBC 8.1 01/18/2015   NEUTROABS 4.8 01/18/2015   HGB 14.5 01/18/2015   HCT 44.1 01/18/2015   MCV 88.8 01/18/2015   PLT 343 01/18/2015      Chemistry      Component Value Date/Time   NA 142 01/18/2015 1546   NA 140 11/17/2014 0851   K 4.2 01/18/2015 1546   K 3.6 11/17/2014 0851   CL 102 11/17/2014 0851   CO2 30* 01/18/2015 1546   CO2 28 11/17/2014 0851   BUN 17.4 01/18/2015 1546   BUN 16 11/17/2014 0851   CREATININE 0.9 01/18/2015 1546   CREATININE 0.85 11/17/2014 0851   CREATININE 0.59 01/09/2013 1625      Component Value Date/Time   CALCIUM 10.5* 01/18/2015 1546   CALCIUM 10.3 11/17/2014 0851   ALKPHOS 65 01/18/2015 1546   ALKPHOS 56 11/17/2014 0851   AST 16 01/18/2015 1546   AST 15 11/17/2014 0851   ALT 11 01/18/2015 1546   ALT 12 11/17/2014 0851   BILITOT 0.62 01/18/2015 1546   BILITOT 0.7 11/17/2014 0851       Lab Results  Component Value Date   LABCA2 24 12/06/2010    No components  found for: CRFVO360  No results  for input(s): INR in the last 168 hours.  Urinalysis    Component Value Date/Time   COLORURINE YELLOW 07/23/2014 1236   APPEARANCEUR CLOUDY* 07/23/2014 1236   LABSPEC 1.021 07/23/2014 1236   LABSPEC 1.020 12/24/2005 1645   PHURINE 5.5 07/23/2014 1236   PHURINE 6.0 12/24/2005 1645   GLUCOSEU NEGATIVE 07/23/2014 1236   HGBUR SMALL* 07/23/2014 1236   HGBUR negative 12/20/2009 1506   HGBUR Moderate 12/24/2005 1645   BILIRUBINUR neg 11/16/2014 1655   BILIRUBINUR NEGATIVE 07/23/2014 1236   BILIRUBINUR Negative 12/24/2005 1645   KETONESUR NEGATIVE 07/23/2014 1236   KETONESUR Negative 12/24/2005 1645   PROTEINUR 1+ 11/16/2014 1655   PROTEINUR NEGATIVE 07/23/2014 1236   PROTEINUR 100 12/24/2005 1645   UROBILINOGEN negative 11/16/2014 1655   UROBILINOGEN 0.2 07/23/2014 1236   NITRITE pos 11/16/2014 1655   NITRITE POSITIVE* 07/23/2014 1236   NITRITE Positive 12/24/2005 1645   LEUKOCYTESUR moderate (2+)* 11/16/2014 1655   LEUKOCYTESUR Large 12/24/2005 1645    STUDIES: Screening mammography was performed earlier today, results pending.  ASSESSMENT: 74 y.o. [place] woman status post left lumpectomy and axillary lymph node dissection February 2006 for a T2 N1 invasive ductal carcinoma strongly estrogen and progesterone receptor positive, HER-2/neu equivocal,  (1) treated adjuvantly with dose-dense doxorubicin and cyclophosphamide x4 followed by dose dense paclitaxel x4 completed July 2006  (2) followed by radiation completed in October 2006.  (3)  She was on Arimidex between September 2006 and June 2009 at which time she was switched to tamoxifen, completed June 2012.    PLAN:  Tomoko is now a little over 10 years out from her definitive surgery with no evidence of disease recurrence. This is very favorable.  She is an excellent candidate for our survivorship program and I gave her information on that today. She is very interested in participating.  At this point she is not sure  whether she would want to continue to see the survivorship nurse practitioner on a yearly basis or alternate seeing me every other year. I'm going to leave that decision to her and the survivorship nurse practitioner when they meet next October (we are moving the visits to October so she can have the results of her mammogram done in late September and not have to wait for results as she did today). If Emilyanne wishes to continue to see me on alternate years I would see her in October 2018.  She has a good understanding of this plan and is in agreement with that. She knows I will be glad to see her at any point in the future if and when the need arises.   MAGRINAT,GUSTAV C    01/25/2015

## 2015-02-08 ENCOUNTER — Encounter: Payer: Self-pay | Admitting: Physical Therapy

## 2015-02-08 ENCOUNTER — Ambulatory Visit: Payer: Medicare Other | Attending: Orthopedic Surgery | Admitting: Physical Therapy

## 2015-02-08 DIAGNOSIS — M25661 Stiffness of right knee, not elsewhere classified: Secondary | ICD-10-CM | POA: Insufficient documentation

## 2015-02-08 DIAGNOSIS — R262 Difficulty in walking, not elsewhere classified: Secondary | ICD-10-CM | POA: Insufficient documentation

## 2015-02-08 DIAGNOSIS — M25561 Pain in right knee: Secondary | ICD-10-CM | POA: Insufficient documentation

## 2015-02-08 NOTE — Therapy (Signed)
[address] [place], [place], [address] Phone: [phone]   Fax:  [phone]  Physical Therapy Evaluation  Patient Details  Name: Amanda Joyce MRN: [id number] Date of Birth: 1940/05/14 Referring Provider:  Ninetta Lights, MD  Encounter Date: 02/08/2015      PT End of Session - 02/08/15 1628    Visit Number 1   Date for PT Re-Evaluation 04/10/15   PT Start Time 2992   PT Stop Time 1630   PT Time Calculation (min) 59 min   Activity Tolerance Patient tolerated treatment well;Patient limited by pain   Behavior During Therapy [facility] for tasks assessed/performed      Past Medical History  Diagnosis Date  . Hypertension   . Depression   . Gout   . Temporomandibular joint disorders, unspecified   . Personal history of malignant neoplasm of breast   . Breast cancer ([place])     Left   . Irritable bowel syndrome   . Nontoxic uninodular goiter   . Hyperlipemia   . Hiatal hernia   . GERD (gastroesophageal reflux disease)   . History of stress test >10 yrs. ago    in [place], [place] - no need for follow up  . Anxiety   . Arthritis     neck, shoulders, back, knees   . Osteoarthrosis, unspecified whether generalized or localized, unspecified site     Past Surgical History  Procedure Laterality Date  . Appendectomy    . Hernia repair    . Abdominal hysterectomy    . Thyroid surgery    . Breast lumpectomy      Left breast X2  . Eye surgery Bilateral     /w iol  . Total knee arthroplasty Right 08/04/2014    Procedure: RIGHT TOTAL KNEE ARTHROPLASTY;  Surgeon: Kathryne Hitch, MD;  Location: [facility];  Service: Orthopedics;  Laterality: Right;    There were no vitals filed for this visit.  Visit Diagnosis:  Right knee pain - Plan: PT plan of care cert/re-cert  Knee stiffness, right - Plan: PT plan of care cert/re-cert  Difficulty walking - Plan: PT plan of care cert/re-cert      Subjective Assessment -  02/08/15 1533    Subjective Patient underwent a Right TKR in March 2016.  She had PT but continued to have stiffness.  She underwent a right knee manipulation under anesthesia yesterday 02/07/15.     Patient Stated Goals walk without pain, have normal movement   Currently in Pain? Yes   Pain Score 7    Pain Location Knee   Pain Orientation Right   Pain Descriptors / Indicators Aching;Tightness   Pain Type Acute pain   Pain Onset Yesterday   Aggravating Factors  any motions   Pain Relieving Factors rest and pain meds            [facility] PT Assessment - 02/08/15 0001    Assessment   Medical Diagnosis S/P manipulation of the right knee   Onset Date/Surgical Date 02/07/15   Prior Therapy had some PT but stopped due high co-pay   Precautions   Precautions None   Restrictions   Weight Bearing Restrictions No   Balance Screen   Has the patient fallen in the past 6 months No   Has the patient had a decrease in activity level because of a fear of falling?  No   Is the patient reluctant to leave their home because  of a fear of falling?  No   Home Environment   Additional Comments has stairs and lives with her son   Prior Function   Level of Independence Independent with basic ADLs;Independent with homemaking with ambulation   Vocation Retired   Leisure some walking dog   AROM   Overall AROM Comments AROM at edge of bed 15-90 degrees flexion   PROM   Overall PROM Comments PROM 10-100 degrees flexion with significant c/o pain and gaurding   Strength   Overall Strength Comments 4/5 with mild pain   Palpation   Palpation comment mild warmth, tender around the right knee   Ambulation/Gait   Gait Comments gait is very stiff legged on the right                   [facility] Adult PT Treatment/Exercise - February 26, 2015 0001    Manual Therapy   Manual Therapy Joint mobilization   Joint Mobilization scar and patella, joint distraction   Passive ROM contract relax, flexion and extension                 PT Education - 02-26-15 1628    Education provided Yes   Education Details low load long duration stretches for flexion   Person(s) Educated Patient   Methods Explanation;Demonstration;Handout   Comprehension Verbalized understanding          PT Short Term Goals - 02-26-2015 1632    PT SHORT TERM GOAL #1   Title independent with initial HEP   Time 2   Period Weeks   Status New           PT Long Term Goals - Feb 26, 2015 1632    PT LONG TERM GOAL #1   Title decrease pain 50%   Time 8   Period Weeks   Status New   PT LONG TERM GOAL #2   Title increase AROM of the right knee to5-115 degrees flexion   Time 8   Period Weeks   Status New   PT LONG TERM GOAL #3   Title walk without assistive device 500 feet   Time 8   Period Weeks   Status New   PT LONG TERM GOAL #4   Title go up and down stairs step over step   Time 8   Period Weeks   Status New               Plan - 02/26/2015 1630    Clinical Impression Statement Patient with right TKR in March, ROM was very stiff and she had a high co-pay and did not come much.  She underwent a manipulation yesterday.  She is swollen and sore today.  Still very stiff, some stiff legged gait.   Pt will benefit from skilled therapeutic intervention in order to improve on the following deficits Abnormal gait;Decreased mobility;Decreased range of motion;Decreased strength;Difficulty walking;Impaired flexibility;Pain   Rehab Potential Good   PT Frequency 2x / week  We will start out at 4-5x/week to get on top of ROM after manipulation   PT Duration 8 weeks   PT Treatment/Interventions Cryotherapy;Electrical Stimulation;Functional mobility training;Stair training;Gait training;Therapeutic activities;Therapeutic exercise;Balance training;Manual techniques;Scar mobilization   PT Next Visit Plan Restart PT focus on ROM 5x/week this week   Consulted and Agree with Plan of Care Patient          G-Codes - February 26, 2015  1633    Functional Assessment Tool Used FOTO   Functional Limitation Mobility: Walking and moving around  Mobility: Walking and Moving Around Current Status [phone]) At least 60 percent but less than 80 percent impaired, limited or restricted   Mobility: Walking and Moving Around Goal Status [phone]) At least 40 percent but less than 60 percent impaired, limited or restricted       Problem List Patient Active Problem List   Diagnosis Date Noted  . DJD (degenerative joint disease) of knee 08/04/2014  . Viral bronchitis 03/30/2014  . Breast cancer, left breast ([place]) 01/19/2014  . Insomnia 04/18/2013  . Pulmonary nodules 01/22/2013  . Cervical strain 09/14/2012  . Seborrheic dermatitis of scalp 08/18/2012  . History of breast cancer 10/30/2011  . Hip pain, right 07/02/2011  . Internal hemorrhoids without mention of complication 88/41/6606  . Internal hemorrhoids with other complication 30/16/0109  . ONYCHOMYCOSIS, BILATERAL 07/18/2010  . HYPERLIPIDEMIA 06/08/2010  . ACTINIC KERATOSIS 09/13/2009  . BAKER'S CYST 09/08/2009  . Osteoarthritis 09/06/2009  . CALF PAIN, LEFT 09/06/2009  . OCULAR MIGRAINE 08/11/2009  . KNEE PAIN, BILATERAL 03/09/2009  . DEPRESSIVE DISORDER 09/29/2008  . NECK PAIN 09/29/2008  . BACK PAIN, THORACIC REGION 06/28/2008  . UNSPECIFIED VITAMIN D DEFICIENCY 12/30/2007  . DYSPEPSIA&OTHER [place] DISORDERS FUNCTION STOMACH 12/25/2007  . DYSPHAGIA UNSPECIFIED 12/25/2007  . ANXIETY STATE, UNSPECIFIED 12/22/2007  . HIATAL HERNIA WITH REFLUX 12/22/2007  . Inflamed seborrheic keratosis 06/26/2007  . GOUT 06/10/2007  . Allergic rhinitis, cause unspecified 02/20/2007  . 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 OSTEOARTHRITIS 09/19/2006  . HX, PERSONAL, MALIGNANCY, BREAST 09/19/2006  . THYROID NODULE 09/18/2006  . HYPERTENSION 09/18/2006  . IBS 09/18/2006  . ARTHRITIS 09/18/2006    Sumner Boast., PT 02/08/2015, 4:40 PM  [address] [place], [place], [address] Phone: [phone]   Fax:  [phone]

## 2015-02-09 ENCOUNTER — Ambulatory Visit: Payer: Medicare Other | Admitting: Physical Therapy

## 2015-02-09 ENCOUNTER — Encounter: Payer: Self-pay | Admitting: Physical Therapy

## 2015-02-09 DIAGNOSIS — M25561 Pain in right knee: Secondary | ICD-10-CM | POA: Diagnosis not present

## 2015-02-09 DIAGNOSIS — M25661 Stiffness of right knee, not elsewhere classified: Secondary | ICD-10-CM

## 2015-02-09 DIAGNOSIS — R262 Difficulty in walking, not elsewhere classified: Secondary | ICD-10-CM

## 2015-02-09 NOTE — Therapy (Signed)
[address] [place], [place], [address] Phone: [phone]   Fax:  [phone]  Physical Therapy Treatment  Patient Details  Name: Amanda Joyce MRN: [id number] Date of Birth: Aug 01, 1940 Referring Provider:  Ninetta Lights, MD  Encounter Date: 02/09/2015      PT End of Session - 02/09/15 1524    Visit Number 2   Date for PT Re-Evaluation 04/10/15   PT Start Time 5885   PT Stop Time 1540   PT Time Calculation (min) 63 min   Behavior During Therapy [facility] for tasks assessed/performed      Past Medical History  Diagnosis Date  . Hypertension   . Depression   . Gout   . Temporomandibular joint disorders, unspecified   . Personal history of malignant neoplasm of breast   . Breast cancer ([place])     Left   . Irritable bowel syndrome   . Nontoxic uninodular goiter   . Hyperlipemia   . Hiatal hernia   . GERD (gastroesophageal reflux disease)   . History of stress test >10 yrs. ago    in [place], [place] - no need for follow up  . Anxiety   . Arthritis     neck, shoulders, back, knees   . Osteoarthrosis, unspecified whether generalized or localized, unspecified site     Past Surgical History  Procedure Laterality Date  . Appendectomy    . Hernia repair    . Abdominal hysterectomy    . Thyroid surgery    . Breast lumpectomy      Left breast X2  . Eye surgery Bilateral     /w iol  . Total knee arthroplasty Right 08/04/2014    Procedure: RIGHT TOTAL KNEE ARTHROPLASTY;  Surgeon: Kathryne Hitch, MD;  Location: [facility];  Service: Orthopedics;  Laterality: Right;    There were no vitals filed for this visit.  Visit Diagnosis:  Knee stiffness, right  Right knee pain  Difficulty walking      Subjective Assessment - 02/09/15 1442    Subjective Suprisingly not bad.   Currently in Pain? Yes   Pain Score 5    Pain Location Knee   Pain Orientation Right   Pain Descriptors / Indicators Aching;Tightness    Pain Type Acute pain   Pain Onset In the past 7 days   Pain Frequency Constant                         [facility] Adult PT Treatment/Exercise - 02/09/15 0001    Knee/Hip Exercises: Aerobic   Stationary Bike 5 minutes seat position 6   Tread Mill NuStep L5X6   Knee/Hip Exercises: Machines for Strengthening   Cybex Knee Extension 5# 2x15   Cybex Knee Flexion 25# 2x15   Cybex Leg Press 20# and no weight, working on deeper flexion   Modalities   Modalities Vasopneumatic   Vasopneumatic   Number Minutes Vasopneumatic  15 minutes   Vasopnuematic Location  Knee   Vasopneumatic Pressure High   Vasopneumatic Temperature  35   Manual Therapy   Manual Therapy Joint mobilization   Joint Mobilization scar and patella, joint distraction   Passive ROM contract relax, flexion and extension                PT Education - 02/08/15 1628    Education provided Yes   Education Details low load long duration stretches for flexion  Person(s) Educated Patient   Methods Explanation;Demonstration;Handout   Comprehension Verbalized understanding          PT Short Term Goals - 02-22-2015 1632    PT SHORT TERM GOAL #1   Title independent with initial HEP   Time 2   Period Weeks   Status New           PT Long Term Goals - Feb 22, 2015 1632    PT LONG TERM GOAL #1   Title decrease pain 50%   Time 8   Period Weeks   Status New   PT LONG TERM GOAL #2   Title increase AROM of the right knee to5-115 degrees flexion   Time 8   Period Weeks   Status New   PT LONG TERM GOAL #3   Title walk without assistive device 500 feet   Time 8   Period Weeks   Status New   PT LONG TERM GOAL #4   Title go up and down stairs step over step   Time 8   Period Weeks   Status New               Plan - 02/09/15 1524    Clinical Impression Statement Very stiff legged gait.  Has a reaction to any pushing, but if we go slow she really will allow PROM, just fearful   PT Next  Visit Plan continue as today to gain ROM   Consulted and Agree with Plan of Care Patient          G-Codes - 02/22/2015 1633    Functional Assessment Tool Used FOTO   Functional Limitation Mobility: Walking and moving around   Mobility: Walking and Moving Around Current Status [phone]) At least 60 percent but less than 80 percent impaired, limited or restricted   Mobility: Walking and Moving Around Goal Status [phone]) At least 40 percent but less than 60 percent impaired, limited or restricted      Problem List Patient Active Problem List   Diagnosis Date Noted  . DJD (degenerative joint disease) of knee 08/04/2014  . Viral bronchitis 03/30/2014  . Breast cancer, left breast ([place]) 01/19/2014  . Insomnia 04/18/2013  . Pulmonary nodules 01/22/2013  . Cervical strain 09/14/2012  . Seborrheic dermatitis of scalp 08/18/2012  . History of breast cancer 10/30/2011  . Hip pain, right 07/02/2011  . Internal hemorrhoids without mention of complication 31/51/7616  . Internal hemorrhoids with other complication 07/37/1062  . ONYCHOMYCOSIS, BILATERAL 07/18/2010  . HYPERLIPIDEMIA 06/08/2010  . ACTINIC KERATOSIS 09/13/2009  . BAKER'S CYST 09/08/2009  . Osteoarthritis 09/06/2009  . CALF PAIN, LEFT 09/06/2009  . OCULAR MIGRAINE 08/11/2009  . KNEE PAIN, BILATERAL 03/09/2009  . DEPRESSIVE DISORDER 09/29/2008  . NECK PAIN 09/29/2008  . BACK PAIN, THORACIC REGION 06/28/2008  . UNSPECIFIED VITAMIN D DEFICIENCY 12/30/2007  . DYSPEPSIA&OTHER [place] DISORDERS FUNCTION STOMACH 12/25/2007  . DYSPHAGIA UNSPECIFIED 12/25/2007  . ANXIETY STATE, UNSPECIFIED 12/22/2007  . HIATAL HERNIA WITH REFLUX 12/22/2007  . Inflamed seborrheic keratosis 06/26/2007  . GOUT 06/10/2007  . Allergic rhinitis, cause unspecified 02/20/2007  . 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 OSTEOARTHRITIS 09/19/2006  . HX, PERSONAL, MALIGNANCY, BREAST 09/19/2006  . THYROID NODULE 09/18/2006  . HYPERTENSION 09/18/2006  . IBS 09/18/2006  .  ARTHRITIS 09/18/2006    Sumner Boast., PT 02/09/2015, 3:27 PM  [address] [place], [place], [address] Phone: [phone]   Fax:  [phone]

## 2015-02-10 ENCOUNTER — Ambulatory Visit: Payer: Medicare Other | Admitting: Physical Therapy

## 2015-02-10 ENCOUNTER — Encounter: Payer: Self-pay | Admitting: Physical Therapy

## 2015-02-10 DIAGNOSIS — R262 Difficulty in walking, not elsewhere classified: Secondary | ICD-10-CM

## 2015-02-10 DIAGNOSIS — M25561 Pain in right knee: Secondary | ICD-10-CM | POA: Diagnosis not present

## 2015-02-10 DIAGNOSIS — M25661 Stiffness of right knee, not elsewhere classified: Secondary | ICD-10-CM

## 2015-02-10 NOTE — Therapy (Signed)
[address] [place], [place], [address] Phone: [phone]   Fax:  [phone]  Physical Therapy Treatment  Patient Details  Name: Amanda Joyce MRN: [id number] Date of Birth: [birth year] Referring Provider:  Ninetta Lights, MD  Encounter Date: 02/10/2015      PT End of Session - 02/10/15 1613    Visit Number 3   Date for PT Re-Evaluation 04/10/15   PT Start Time 1522   PT Stop Time 1630   PT Time Calculation (min) 68 min   Activity Tolerance Patient tolerated treatment well;Patient limited by pain   Behavior During Therapy [facility] for tasks assessed/performed      Past Medical History  Diagnosis Date  . Hypertension   . Depression   . Gout   . Temporomandibular joint disorders, unspecified   . Personal history of malignant neoplasm of breast   . Breast cancer ([place])     Left   . Irritable bowel syndrome   . Nontoxic uninodular goiter   . Hyperlipemia   . Hiatal hernia   . GERD (gastroesophageal reflux disease)   . History of stress test >10 yrs. ago    in [place], [place] - no need for follow up  . Anxiety   . Arthritis     neck, shoulders, back, knees   . Osteoarthrosis, unspecified whether generalized or localized, unspecified site     Past Surgical History  Procedure Laterality Date  . Appendectomy    . Hernia repair    . Abdominal hysterectomy    . Thyroid surgery    . Breast lumpectomy      Left breast X2  . Eye surgery Bilateral     /w iol  . Total knee arthroplasty Right 08/04/2014    Procedure: RIGHT TOTAL KNEE ARTHROPLASTY;  Surgeon: Kathryne Hitch, MD;  Location: [facility];  Service: Orthopedics;  Laterality: Right;    There were no vitals filed for this visit.  Visit Diagnosis:  Knee stiffness, right  Right knee pain  Difficulty walking      Subjective Assessment - 02/10/15 1536    Subjective I am really sore today.   Currently in Pain? Yes   Pain Score 7    Pain  Location Knee   Pain Orientation Right   Pain Descriptors / Indicators Aching;Sore;Tightness   Pain Type Acute pain   Pain Onset In the past 7 days   Pain Frequency Constant            [facility] PT Assessment - 02/10/15 0001    AROM   Overall AROM Comments AROM to 107 degrees flexion   PROM   Overall PROM Comments PROM to 113 degrees flexion                     [facility] Adult PT Treatment/Exercise - 02/10/15 0001    Knee/Hip Exercises: Aerobic   Stationary Bike 5 minutes seat position 6   Tread Mill NuStep L5X6   Knee/Hip Exercises: Machines for Strengthening   Cybex Knee Extension 5# 2x15   Cybex Knee Flexion 25# 2x15   Knee/Hip Exercises: Standing   Terminal Knee Extension Right;5 sets   Vasopneumatic   Number Minutes Vasopneumatic  15 minutes   Vasopnuematic Location  Knee   Vasopneumatic Pressure High   Vasopneumatic Temperature  40   Manual Therapy   Manual Therapy Joint mobilization   Joint Mobilization scar and patella, joint distraction   Passive  ROM contract relax, flexion and extension                  PT Short Term Goals - 02/08/15 1632    PT SHORT TERM GOAL #1   Title independent with initial HEP   Time 2   Period Weeks   Status New           PT Long Term Goals - 02/08/15 1632    PT LONG TERM GOAL #1   Title decrease pain 50%   Time 8   Period Weeks   Status New   PT LONG TERM GOAL #2   Title increase AROM of the right knee to5-115 degrees flexion   Time 8   Period Weeks   Status New   PT LONG TERM GOAL #3   Title walk without assistive device 500 feet   Time 8   Period Weeks   Status New   PT LONG TERM GOAL #4   Title go up and down stairs step over step   Time 8   Period Weeks   Status New               Plan - 02/10/15 1613    Clinical Impression Statement Increased swelling and pain today.  She is more gaurded but can relax with cues.  Her ROM has improved over the past few days.   PT Next Visit Plan  continue to work on ROM, due to the copay may decrease her frequency and have her come in and do the bike on her own   Consulted and Agree with Plan of Care Patient        Problem List Patient Active Problem List   Diagnosis Date Noted  . DJD (degenerative joint disease) of knee 08/04/2014  . Viral bronchitis 03/30/2014  . Breast cancer, left breast ([place]) 01/19/2014  . Insomnia 04/18/2013  . Pulmonary nodules 01/22/2013  . Cervical strain 09/14/2012  . Seborrheic dermatitis of scalp 08/18/2012  . History of breast cancer 10/30/2011  . Hip pain, right 07/02/2011  . Internal hemorrhoids without mention of complication 95/63/8756  . Internal hemorrhoids with other complication 43/32/9518  . ONYCHOMYCOSIS, BILATERAL 07/18/2010  . HYPERLIPIDEMIA 06/08/2010  . ACTINIC KERATOSIS 09/13/2009  . BAKER'S CYST 09/08/2009  . Osteoarthritis 09/06/2009  . CALF PAIN, LEFT 09/06/2009  . OCULAR MIGRAINE 08/11/2009  . KNEE PAIN, BILATERAL 03/09/2009  . DEPRESSIVE DISORDER 09/29/2008  . NECK PAIN 09/29/2008  . BACK PAIN, THORACIC REGION 06/28/2008  . UNSPECIFIED VITAMIN D DEFICIENCY 12/30/2007  . DYSPEPSIA&OTHER [place] DISORDERS FUNCTION STOMACH 12/25/2007  . DYSPHAGIA UNSPECIFIED 12/25/2007  . ANXIETY STATE, UNSPECIFIED 12/22/2007  . HIATAL HERNIA WITH REFLUX 12/22/2007  . Inflamed seborrheic keratosis 06/26/2007  . GOUT 06/10/2007  . Allergic rhinitis, cause unspecified 02/20/2007  . 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 OSTEOARTHRITIS 09/19/2006  . HX, PERSONAL, MALIGNANCY, BREAST 09/19/2006  . THYROID NODULE 09/18/2006  . HYPERTENSION 09/18/2006  . IBS 09/18/2006  . ARTHRITIS 09/18/2006    Sumner Boast., PT 02/10/2015, 4:17 PM  [address] [place], [place], [address] Phone: [phone]   Fax:  [phone]

## 2015-02-11 ENCOUNTER — Encounter: Payer: Self-pay | Admitting: Physical Therapy

## 2015-02-11 ENCOUNTER — Ambulatory Visit: Payer: Medicare Other | Admitting: Physical Therapy

## 2015-02-11 DIAGNOSIS — M25561 Pain in right knee: Secondary | ICD-10-CM | POA: Diagnosis not present

## 2015-02-11 DIAGNOSIS — M25661 Stiffness of right knee, not elsewhere classified: Secondary | ICD-10-CM

## 2015-02-11 DIAGNOSIS — R262 Difficulty in walking, not elsewhere classified: Secondary | ICD-10-CM

## 2015-02-11 NOTE — Therapy (Signed)
[address] [place], [place], [address] Phone: [phone]   Fax:  [phone]  Physical Therapy Treatment  Patient Details  Name: Amanda Joyce MRN: [id number] Date of Birth: [birth year] Referring Provider:  Ninetta Lights, MD  Encounter Date: 02/11/2015      PT End of Session - 02/11/15 1126    Visit Number 4   Date for PT Re-Evaluation 04/10/15   PT Start Time 1010   PT Stop Time 1100   PT Time Calculation (min) 50 min   Activity Tolerance Patient tolerated treatment well;Patient limited by pain   Behavior During Therapy [facility] for tasks assessed/performed      Past Medical History  Diagnosis Date  . Hypertension   . Depression   . Gout   . Temporomandibular joint disorders, unspecified   . Personal history of malignant neoplasm of breast   . Breast cancer ([place])     Left   . Irritable bowel syndrome   . Nontoxic uninodular goiter   . Hyperlipemia   . Hiatal hernia   . GERD (gastroesophageal reflux disease)   . History of stress test >10 yrs. ago    in [place], [place] - no need for follow up  . Anxiety   . Arthritis     neck, shoulders, back, knees   . Osteoarthrosis, unspecified whether generalized or localized, unspecified site     Past Surgical History  Procedure Laterality Date  . Appendectomy    . Hernia repair    . Abdominal hysterectomy    . Thyroid surgery    . Breast lumpectomy      Left breast X2  . Eye surgery Bilateral     /w iol  . Total knee arthroplasty Right 08/04/2014    Procedure: RIGHT TOTAL KNEE ARTHROPLASTY;  Surgeon: Kathryne Hitch, MD;  Location: [facility];  Service: Orthopedics;  Laterality: Right;    There were no vitals filed for this visit.  Visit Diagnosis:  Knee stiffness, right  Right knee pain  Difficulty walking      Subjective Assessment - 02/11/15 1015    Subjective Still sore, feeling stiffer.   Currently in Pain? Yes   Pain Score 5    Pain  Location Knee   Pain Orientation Right   Pain Descriptors / Indicators Aching;Sore;Tightness   Pain Type Acute pain            [facility] PT Assessment - 02/11/15 0001    AROM   Overall AROM Comments AROM to 111 degrees flexion   PROM   Overall PROM Comments PROM to 118 degrees flexion                     [facility] Adult PT Treatment/Exercise - 02/11/15 0001    Ambulation/Gait   Gait Comments Stairs step over step   Knee/Hip Exercises: Aerobic   Stationary Bike 5 minutes seat position 6   Tread Mill NuStep L5X6   Knee/Hip Exercises: Machines for Strengthening   Cybex Knee Extension 5# 2x15   Cybex Knee Flexion 25# 2x15   Cybex Leg Press 20# and then no weight working to get down to position #2   Knee/Hip Exercises: Standing   Forward Lunges Limitations lunge stretch on step 10 seconds 10 reps   Terminal Knee Extension Right;5 sets   Knee/Hip Exercises: Supine   Short Arc Quad Sets 3 sets;10 reps   Short Arc Quad Sets Limitations 3#  Manual Therapy   Manual Therapy Joint mobilization   Joint Mobilization scar and patella, joint distraction   Passive ROM contract relax, flexion and extension                  PT Short Term Goals - 02/08/15 1632    PT SHORT TERM GOAL #1   Title independent with initial HEP   Time 2   Period Weeks   Status New           PT Long Term Goals - 02/08/15 1632    PT LONG TERM GOAL #1   Title decrease pain 50%   Time 8   Period Weeks   Status New   PT LONG TERM GOAL #2   Title increase AROM of the right knee to5-115 degrees flexion   Time 8   Period Weeks   Status New   PT LONG TERM GOAL #3   Title walk without assistive device 500 feet   Time 8   Period Weeks   Status New   PT LONG TERM GOAL #4   Title go up and down stairs step over step   Time 8   Period Weeks   Status New               Plan - 02/11/15 1127    Clinical Impression Statement Very good increase in ROM over the past few days.  She  is still tight and painful but has been able to allow PROM   PT Next Visit Plan She will try to do the bike and nustep on her own next week and see if she can get ROM on own due to her co-pay   Consulted and Agree with Plan of Care Patient        Problem List Patient Active Problem List   Diagnosis Date Noted  . DJD (degenerative joint disease) of knee 08/04/2014  . Viral bronchitis 03/30/2014  . Breast cancer, left breast ([place]) 01/19/2014  . Insomnia 04/18/2013  . Pulmonary nodules 01/22/2013  . Cervical strain 09/14/2012  . Seborrheic dermatitis of scalp 08/18/2012  . History of breast cancer 10/30/2011  . Hip pain, right 07/02/2011  . Internal hemorrhoids without mention of complication 76/16/0737  . Internal hemorrhoids with other complication 10/62/6948  . ONYCHOMYCOSIS, BILATERAL 07/18/2010  . HYPERLIPIDEMIA 06/08/2010  . ACTINIC KERATOSIS 09/13/2009  . BAKER'S CYST 09/08/2009  . Osteoarthritis 09/06/2009  . CALF PAIN, LEFT 09/06/2009  . OCULAR MIGRAINE 08/11/2009  . KNEE PAIN, BILATERAL 03/09/2009  . DEPRESSIVE DISORDER 09/29/2008  . NECK PAIN 09/29/2008  . BACK PAIN, THORACIC REGION 06/28/2008  . UNSPECIFIED VITAMIN D DEFICIENCY 12/30/2007  . DYSPEPSIA&OTHER [place] DISORDERS FUNCTION STOMACH 12/25/2007  . DYSPHAGIA UNSPECIFIED 12/25/2007  . ANXIETY STATE, UNSPECIFIED 12/22/2007  . HIATAL HERNIA WITH REFLUX 12/22/2007  . Inflamed seborrheic keratosis 06/26/2007  . GOUT 06/10/2007  . Allergic rhinitis, cause unspecified 02/20/2007  . 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 OSTEOARTHRITIS 09/19/2006  . HX, PERSONAL, MALIGNANCY, BREAST 09/19/2006  . THYROID NODULE 09/18/2006  . HYPERTENSION 09/18/2006  . IBS 09/18/2006  . ARTHRITIS 09/18/2006    Sumner Boast., PT 02/11/2015, 11:29 AM  [place] [address] [facility] [place], [place], [address] Phone: [phone]   Fax:  [phone]

## 2015-02-15 ENCOUNTER — Ambulatory Visit: Payer: Medicare Other | Admitting: Physical Therapy

## 2015-02-18 ENCOUNTER — Telehealth: Payer: Self-pay | Admitting: Family Medicine

## 2015-02-18 DIAGNOSIS — G894 Chronic pain syndrome: Secondary | ICD-10-CM

## 2015-02-18 MED ORDER — OXYCODONE-ACETAMINOPHEN 5-325 MG PO TABS: 1.0000 | ORAL_TABLET | ORAL | Status: DC | PRN

## 2015-02-18 NOTE — Telephone Encounter (Signed)
Relation to PP:IRJJ Call back number: [phone]   Reason for call:  Patient requesting a refill oxyCODONE-acetaminophen (ROXICET) 5-325 MG per tablet

## 2015-02-18 NOTE — Telephone Encounter (Signed)
Patient aware Rx ready for pick up.      KP 

## 2015-02-18 NOTE — Telephone Encounter (Signed)
Last seen 12/13/14 and filled 01/14/15 #90 UDS 02/10/13 Low risk   Please advise     KP

## 2015-02-18 NOTE — Telephone Encounter (Signed)
Refill x1 

## 2015-02-21 ENCOUNTER — Ambulatory Visit: Payer: Medicare Other | Admitting: Physical Therapy

## 2015-02-21 ENCOUNTER — Encounter: Payer: Self-pay | Admitting: Physical Therapy

## 2015-02-21 DIAGNOSIS — M25661 Stiffness of right knee, not elsewhere classified: Secondary | ICD-10-CM

## 2015-02-21 DIAGNOSIS — M25561 Pain in right knee: Secondary | ICD-10-CM

## 2015-02-21 DIAGNOSIS — R262 Difficulty in walking, not elsewhere classified: Secondary | ICD-10-CM

## 2015-02-21 NOTE — Therapy (Signed)
[place] [address] [place], [place], [address] Phone: [phone]   Fax:  [phone]  Physical Therapy Treatment  Patient Details  Name: Amanda Joyce MRN: [id number] Date of Birth: 1941/02/10 No Data Recorded  Encounter Date: 02/21/2015      PT End of Session - 02/21/15 1603    Visit Number 5   Date for PT Re-Evaluation 04/10/15   PT Start Time 1452   PT Stop Time 1545   PT Time Calculation (min) 53 min   Activity Tolerance Patient tolerated treatment well;Patient limited by pain   Behavior During Therapy [facility] for tasks assessed/performed      Past Medical History  Diagnosis Date  . Hypertension   . Depression   . Gout   . Temporomandibular joint disorders, unspecified   . Personal history of malignant neoplasm of breast   . Breast cancer ([place])     Left   . Irritable bowel syndrome   . Nontoxic uninodular goiter   . Hyperlipemia   . Hiatal hernia   . GERD (gastroesophageal reflux disease)   . History of stress test >10 yrs. ago    in [place], [place] - no need for follow up  . Anxiety   . Arthritis     neck, shoulders, back, knees   . Osteoarthrosis, unspecified whether generalized or localized, unspecified site     Past Surgical History  Procedure Laterality Date  . Appendectomy    . Hernia repair    . Abdominal hysterectomy    . Thyroid surgery    . Breast lumpectomy      Left breast X2  . Eye surgery Bilateral     /w iol  . Total knee arthroplasty Right 08/04/2014    Procedure: RIGHT TOTAL KNEE ARTHROPLASTY;  Surgeon: Kathryne Hitch, MD;  Location: [facility];  Service: Orthopedics;  Laterality: Right;    There were no vitals filed for this visit.  Visit Diagnosis:  Knee stiffness, right  Right knee pain  Difficulty walking      Subjective Assessment - 02/21/15 1456    Subjective I am walking better, I am taking ibuprofren and feel that it is better with pain   Currently in Pain? Yes    Pain Score 4    Pain Location Knee   Pain Orientation Right   Aggravating Factors  bending   Pain Relieving Factors rest and ibuprofren                         [facility] Adult PT Treatment/Exercise - 02/21/15 0001    Ambulation/Gait   Gait Comments gait around the building, working on big steps   Knee/Hip Exercises: Aerobic   Stationary Bike 5 minutes seat position 6   Tread Mill NuStep L5X6   Knee/Hip Exercises: Machines for Strengthening   Cybex Knee Extension 10# 2x10   Cybex Knee Flexion 25# 2x15   Cybex Leg Press 20# and then no weight working to get down to position #2   Manual Therapy   Manual Therapy Joint mobilization   Joint Mobilization scar and patella, joint distraction   Passive ROM contract relax, flexion and extension                  PT Short Term Goals - 02/08/15 1632    PT SHORT TERM GOAL #1   Title independent with initial HEP   Time 2   Period Weeks  Status New           PT Long Term Goals - 02/21/15 1604    PT LONG TERM GOAL #1   Title decrease pain 50%   Status Partially Met   PT LONG TERM GOAL #2   Title increase AROM of the right knee to5-115 degrees flexion   Status On-going   PT LONG TERM GOAL #3   Title walk without assistive device 500 feet   Status Achieved   PT LONG TERM GOAL #4   Title go up and down stairs step over step   Status On-going               Plan - 02/21/15 1604    Clinical Impression Statement ROM and gait continue to improve.  She comes in stiff and walks stiff at first but this gets better after stretching, I have encouraged her to really continue to stretch at home   PT Next Visit Plan She will try to do the bike and nustep on her own next week and see if she can get ROM on own due to her co-pay   Consulted and Agree with Plan of Care Patient        Problem List Patient Active Problem List   Diagnosis Date Noted  . DJD (degenerative joint disease) of knee 08/04/2014  .  Viral bronchitis 03/30/2014  . Breast cancer, left breast ([place]) 01/19/2014  . Insomnia 04/18/2013  . Pulmonary nodules 01/22/2013  . Cervical strain 09/14/2012  . Seborrheic dermatitis of scalp 08/18/2012  . History of breast cancer 10/30/2011  . Hip pain, right 07/02/2011  . Internal hemorrhoids without mention of complication 36/14/4315  . Internal hemorrhoids with other complication 40/12/6759  . ONYCHOMYCOSIS, BILATERAL 07/18/2010  . HYPERLIPIDEMIA 06/08/2010  . ACTINIC KERATOSIS 09/13/2009  . BAKER'S CYST 09/08/2009  . Osteoarthritis 09/06/2009  . CALF PAIN, LEFT 09/06/2009  . OCULAR MIGRAINE 08/11/2009  . KNEE PAIN, BILATERAL 03/09/2009  . DEPRESSIVE DISORDER 09/29/2008  . NECK PAIN 09/29/2008  . BACK PAIN, THORACIC REGION 06/28/2008  . UNSPECIFIED VITAMIN D DEFICIENCY 12/30/2007  . DYSPEPSIA&OTHER [place] DISORDERS FUNCTION STOMACH 12/25/2007  . DYSPHAGIA UNSPECIFIED 12/25/2007  . ANXIETY STATE, UNSPECIFIED 12/22/2007  . HIATAL HERNIA WITH REFLUX 12/22/2007  . Inflamed seborrheic keratosis 06/26/2007  . GOUT 06/10/2007  . Allergic rhinitis, cause unspecified 02/20/2007  . 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 OSTEOARTHRITIS 09/19/2006  . HX, PERSONAL, MALIGNANCY, BREAST 09/19/2006  . THYROID NODULE 09/18/2006  . HYPERTENSION 09/18/2006  . IBS 09/18/2006  . ARTHRITIS 09/18/2006    Sumner Boast., PT 02/21/2015, 4:06 PM  [address] [address] [place], [place], [address] Phone: [phone]   Fax:  [phone]  Name: OMOLOLA MITTMAN MRN: [id number] Date of Birth: 10/10/1940

## 2015-03-04 ENCOUNTER — Telehealth: Payer: Self-pay | Admitting: Family Medicine

## 2015-03-04 NOTE — Telephone Encounter (Signed)
Relation to pt: self  Call back number: [phone]   Reason for call:  Patient requesting a refill ibuprofen (ADVIL,MOTRIN) 800 MG tablet

## 2015-03-04 NOTE — Telephone Encounter (Signed)
Where had she been getting it--- it is a high dose that is not recommended long term with all her other meds

## 2015-03-04 NOTE — Telephone Encounter (Signed)
Last seen 12/13/14 and the RX is not on her medication list. Please advise    KP

## 2015-03-04 NOTE — Telephone Encounter (Signed)
She said the only thing she is taking is the Oxycodone. You prescribed the Ibuprofen but it has been at least 4 months since she has filled it. It helps with the inflammation and she only takes it when she needs it.      KP

## 2015-03-05 ENCOUNTER — Other Ambulatory Visit: Payer: Self-pay | Admitting: Family Medicine

## 2015-03-05 DIAGNOSIS — M158 Other polyosteoarthritis: Secondary | ICD-10-CM

## 2015-03-05 MED ORDER — IBUPROFEN 800 MG PO TABS: 800.0000 mg | ORAL_TABLET | Freq: Three times a day (TID) | ORAL | Status: DC | PRN

## 2015-03-09 NOTE — Telephone Encounter (Signed)
Pristiq has been ordered through [employer]. Confirmation number [id number].       KP

## 2015-03-15 NOTE — Telephone Encounter (Signed)
Patient has been made aware that the Pristiq is here to pick up.       KP

## 2015-03-17 ENCOUNTER — Ambulatory Visit (INDEPENDENT_AMBULATORY_CARE_PROVIDER_SITE_OTHER): Payer: Medicare Other | Admitting: Family Medicine

## 2015-03-17 ENCOUNTER — Encounter: Payer: Self-pay | Admitting: Family Medicine

## 2015-03-17 VITALS — BP 126/82 | HR 48 | Temp 98.0°F | Wt 166.6 lb

## 2015-03-17 DIAGNOSIS — R300 Dysuria: Secondary | ICD-10-CM

## 2015-03-17 DIAGNOSIS — N390 Urinary tract infection, site not specified: Secondary | ICD-10-CM | POA: Diagnosis not present

## 2015-03-17 DIAGNOSIS — M158 Other polyosteoarthritis: Secondary | ICD-10-CM

## 2015-03-17 LAB — POCT URINALYSIS DIPSTICK
Glucose, UA: NEGATIVE
KETONES UA: NEGATIVE
Nitrite, UA: POSITIVE
PH UA: 6
SPEC GRAV UA: 1.02
Urobilinogen, UA: 2

## 2015-03-17 MED ORDER — CIPROFLOXACIN HCL 250 MG PO TABS: 250.0000 mg | ORAL_TABLET | Freq: Two times a day (BID) | ORAL | Status: DC

## 2015-03-17 MED ORDER — IBUPROFEN 800 MG PO TABS: 800.0000 mg | ORAL_TABLET | Freq: Three times a day (TID) | ORAL | Status: DC | PRN

## 2015-03-17 NOTE — Patient Instructions (Signed)

## 2015-03-17 NOTE — Progress Notes (Signed)
Pre visit review using our clinic review tool, if applicable. No additional management support is needed unless otherwise documented below in the visit note. 

## 2015-03-17 NOTE — Progress Notes (Signed)
Patient ID: Amanda Joyce, female    DOB: [birth year]  Age: 74 y.o. MRN: [id number]    Subjective:  Subjective HPI Amanda Joyce presents for uti symptoms, x 4 days--  + dysuria, frequency and urgency.    Review of Systems  Constitutional: Negative for fever, chills, activity change and appetite change.  Gastrointestinal: Negative for abdominal pain and abdominal distention.  Genitourinary: Positive for dysuria, urgency and frequency. Negative for hematuria, flank pain, vaginal discharge, difficulty urinating, genital sores, vaginal pain, menstrual problem, pelvic pain and dyspareunia.  Musculoskeletal: Negative for back pain.  All other systems reviewed and are negative.   History Past Medical History  Diagnosis Date  . Hypertension   . Depression   . Gout   . Temporomandibular joint disorders, unspecified   . Personal history of malignant neoplasm of breast   . Breast cancer ([facility])     Left   . Irritable bowel syndrome   . Nontoxic uninodular goiter   . Hyperlipemia   . Hiatal hernia   . GERD (gastroesophageal reflux disease)   . History of stress test >10 yrs. ago    in [place], [place] - no need for follow up  . Anxiety   . Arthritis     neck, shoulders, back, knees   . Osteoarthrosis, unspecified whether generalized or localized, unspecified site     She has past surgical history that includes Appendectomy; Hernia repair; Abdominal hysterectomy; Thyroid surgery; Breast lumpectomy; Eye surgery (Bilateral); and Total knee arthroplasty (Right, 08/04/2014).   Her family history includes Alcohol abuse in her paternal uncle; Coronary artery disease in her father; Liver cancer in her paternal uncle; Other in her mother. There is no history of Colon cancer.She reports that she has never smoked. She has never used smokeless tobacco. She reports that she does not drink alcohol or use illicit drugs.  Current Outpatient Prescriptions on File Prior to Visit  Medication Sig Dispense  Refill  . atenolol (TENORMIN) 50 MG tablet Take 1 tablet (50 mg total) by mouth 2 (two) times daily. 180 tablet 3  . Azelastine-Fluticasone 137-50 MCG/ACT SUSP Place 1 spray into the nose 2 (two) times daily as needed.    Marland Kitchen b complex vitamins capsule Take 1 capsule by mouth daily.    . Biotin 5000 MCG CAPS Take 5,000 mcg by mouth daily.    . Cholecalciferol (VITAMIN D3) 2000 UNITS capsule Take 4,000 Units by mouth daily.    Marland Kitchen desvenlafaxine (PRISTIQ) 100 MG 24 hr tablet Take 100 mg by mouth daily.    Marland Kitchen diltiazem (CARDIZEM CD) 240 MG 24 hr capsule Take 1 capsule (240 mg total) by mouth daily. (Patient taking differently: Take 240 mg by mouth every morning. ) 90 capsule 3  . enoxaparin (LOVENOX) 30 MG/0.3ML injection Inject 0.3 mLs (30 mg total) into the skin every 12 (twelve) hours. 20 Syringe 0  . ferrous sulfate 325 (65 FE) MG tablet Take 325 mg by mouth daily with breakfast.    . ipratropium (ATROVENT) 0.06 % nasal spray Place 2 sprays into the nose 3 (three) times daily as needed. 15 mL 5  . methocarbamol (ROBAXIN) 500 MG tablet Take 1 tablet (500 mg total) by mouth 4 (four) times daily. 90 tablet 0  . ondansetron (ZOFRAN) 4 MG tablet Take 1 tablet (4 mg total) by mouth every 8 (eight) hours as needed for nausea or vomiting. 40 tablet 0  . oxyCODONE-acetaminophen (ROXICET) 5-325 MG tablet Take 1-2 tablets by mouth every 4 (four)  hours as needed. 90 tablet 0  . pravastatin (PRAVACHOL) 20 MG tablet Take 1 tablet (20 mg total) by mouth daily. (Patient taking differently: Take 20 mg by mouth at bedtime. ) 90 tablet 3  . quinapril (ACCUPRIL) 40 MG tablet Take 1 tablet (40 mg total) by mouth daily. 90 tablet 3  . triamcinolone cream (KENALOG) 0.1 % Apply 1 application topically 2 (two) times daily. 30 g 5  . zolpidem (AMBIEN) 5 MG tablet Take 1 tablet (5 mg total) by mouth at bedtime as needed for sleep. 15 tablet 1   No current facility-administered medications on file prior to visit.       Objective:  Objective Physical Exam  Constitutional: She is oriented to person, place, and time. She appears well-developed and well-nourished. No distress.  Abdominal: Soft. She exhibits no distension. There is no tenderness (no suprapubic or CVA tenderness).  Neurological: She is alert and oriented to person, place, and time.  Psychiatric: She has a normal mood and affect. Her behavior is normal. Judgment and thought content normal.  Nursing note and vitals reviewed.  BP 126/82 mmHg  Pulse 48  Temp(Src) 98 F (36.7 C) (Oral)  Wt 166 lb 9.6 oz (75.569 kg)  SpO2 98% Wt Readings from Last 3 Encounters:  03/17/15 166 lb 9.6 oz (75.569 kg)  12/13/14 174 lb (78.926 kg)  11/16/14 169 lb (76.658 kg)     Lab Results  Component Value Date   WBC 8.1 01/18/2015   HGB 14.5 01/18/2015   HCT 44.1 01/18/2015   PLT 343 01/18/2015   GLUCOSE 83 01/18/2015   CHOL 167 11/17/2014   TRIG 154.0* 11/17/2014   HDL 42.50 11/17/2014   LDLDIRECT 194.5 10/04/2010   LDLCALC 94 11/17/2014   ALT 11 01/18/2015   AST 16 01/18/2015   NA 142 01/18/2015   K 4.2 01/18/2015   CL 102 11/17/2014   CREATININE 0.9 01/18/2015   BUN 17.4 01/18/2015   CO2 30* 01/18/2015   TSH 2.51 11/17/2014   INR 1.04 07/23/2014   HGBA1C 6.1 07/03/2011    Mm Screening Breast Tomo Bilateral  01/27/2015  CLINICAL DATA:  Screening. EXAM: DIGITAL SCREENING BILATERAL MAMMOGRAM WITH 3D TOMO WITH CAD COMPARISON:  Previous exam(s). ACR Breast Density Category b: There are scattered areas of fibroglandular density. FINDINGS: There are no findings suspicious for malignancy. Images were processed with CAD. IMPRESSION: No mammographic evidence of malignancy. A result letter of this screening mammogram will be mailed directly to the patient. RECOMMENDATION: Screening mammogram in one year. (Code:SM-B-01Y) BI-RADS CATEGORY  1: Negative. Electronically Signed   By: Ammie Ferrier M.D.   On: 01/27/2015 08:14     Assessment & Plan:   Plan I am having Amanda Joyce start on ciprofloxacin. I am also having her maintain her Azelastine-Fluticasone, Vitamin D3, ipratropium, atenolol, diltiazem, pravastatin, quinapril, b complex vitamins, desvenlafaxine, Biotin, ondansetron, methocarbamol, enoxaparin, ferrous sulfate, zolpidem, triamcinolone cream, oxyCODONE-acetaminophen, and ibuprofen.  Meds ordered this encounter  Medications  . ciprofloxacin (CIPRO) 250 MG tablet    Sig: Take 1 tablet (250 mg total) by mouth 2 (two) times daily.    Dispense:  6 tablet    Refill:  0  . ibuprofen (ADVIL,MOTRIN) 800 MG tablet    Sig: Take 1 tablet (800 mg total) by mouth every 8 (eight) hours as needed.    Dispense:  90 tablet    Refill:  0    Problem List Items Addressed This Visit    Osteoarthritis   Relevant  Medications   ibuprofen (ADVIL,MOTRIN) 800 MG tablet    Other Visit Diagnoses    Dysuria    -  Primary    Relevant Orders    POCT Urinalysis Dipstick (Completed)    Urine Culture    Urinary tract infection without hematuria, site unspecified        Relevant Medications    ciprofloxacin (CIPRO) 250 MG tablet       Follow-up: Return if symptoms worsen or fail to improve.  Garnet Koyanagi, DO

## 2015-03-19 LAB — URINE CULTURE: Colony Count: >=100000

## 2015-04-12 ENCOUNTER — Telehealth: Payer: Self-pay | Admitting: Family Medicine

## 2015-04-12 DIAGNOSIS — G894 Chronic pain syndrome: Secondary | ICD-10-CM

## 2015-04-12 NOTE — Telephone Encounter (Signed)
Caller name: E[place]lee  Relationship to patient: Self  Can be reached: [phone]  Reason for call: Pt is requesting a refill on her oxyCODONE Rx.

## 2015-04-13 MED ORDER — OXYCODONE-ACETAMINOPHEN 5-325 MG PO TABS: 1.0000 | ORAL_TABLET | ORAL | Status: DC | PRN

## 2015-04-13 NOTE — Telephone Encounter (Signed)
Called pt, informed of the below.    Pt states she will come in tomorrow for pick up

## 2015-04-13 NOTE — Telephone Encounter (Signed)
Rx printed, awaiting MD signature.  

## 2015-04-13 NOTE — Telephone Encounter (Signed)
Please inform Pt that Rx has been placed at front desk for pick up at her convenience. Thank you.  

## 2015-04-13 NOTE — Telephone Encounter (Signed)
Okay #90, no RF

## 2015-04-13 NOTE — Telephone Encounter (Signed)
Pt is requesting refill on Oxycodone. [facility] Pt.  Last OV: 03/17/2015 Last Fill: 02/18/2015 #90 and 0RF Pt sig: 1-2 tablets q4h PRN UDS: None  Please advise.

## 2015-05-13 ENCOUNTER — Other Ambulatory Visit: Payer: Self-pay | Admitting: Family Medicine

## 2015-05-16 ENCOUNTER — Telehealth: Payer: Self-pay | Admitting: Family Medicine

## 2015-05-16 DIAGNOSIS — G894 Chronic pain syndrome: Secondary | ICD-10-CM

## 2015-05-16 MED ORDER — OXYCODONE-ACETAMINOPHEN 5-325 MG PO TABS: 1.0000 | ORAL_TABLET | ORAL | Status: DC | PRN

## 2015-05-16 NOTE — Telephone Encounter (Signed)
Last seen 03/17/15 and filled 04/13/15 #90 UDS 04/15/15 low risk   Please advise    KP

## 2015-05-16 NOTE — Telephone Encounter (Signed)
Patient aware Rx ready for pick up.      KP 

## 2015-05-16 NOTE — Telephone Encounter (Signed)
Pt called for refill on oxycodone. She has a few days. Please call when RX is ready at [phone].

## 2015-05-16 NOTE — Telephone Encounter (Signed)
refll x1

## 2015-06-20 ENCOUNTER — Telehealth: Payer: Self-pay | Admitting: Family Medicine

## 2015-06-20 DIAGNOSIS — G894 Chronic pain syndrome: Secondary | ICD-10-CM

## 2015-06-20 MED ORDER — OXYCODONE-ACETAMINOPHEN 5-325 MG PO TABS: 1.0000 | ORAL_TABLET | ORAL | Status: DC | PRN

## 2015-06-20 NOTE — Telephone Encounter (Signed)
VM left advising Rx will be ready for pick up tomorrow.     KP

## 2015-06-20 NOTE — Telephone Encounter (Signed)
°

## 2015-06-20 NOTE — Telephone Encounter (Signed)
Last seen 03/17/15 and filled 05/16/15 #90 UDS 04/15/15 Low risk   Please advise     KP

## 2015-06-20 NOTE — Telephone Encounter (Signed)
Refill x1 

## 2015-06-23 ENCOUNTER — Ambulatory Visit (INDEPENDENT_AMBULATORY_CARE_PROVIDER_SITE_OTHER): Payer: Medicare Other | Admitting: Family Medicine

## 2015-06-23 ENCOUNTER — Encounter: Payer: Self-pay | Admitting: Family Medicine

## 2015-06-23 VITALS — BP 122/79 | HR 75 | Temp 98.3°F | Ht 69.0 in | Wt 168.8 lb

## 2015-06-23 DIAGNOSIS — D229 Melanocytic nevi, unspecified: Secondary | ICD-10-CM

## 2015-06-23 DIAGNOSIS — J4531 Mild persistent asthma with (acute) exacerbation: Secondary | ICD-10-CM | POA: Diagnosis not present

## 2015-06-23 DIAGNOSIS — Z91048 Other nonmedicinal substance allergy status: Secondary | ICD-10-CM | POA: Diagnosis not present

## 2015-06-23 DIAGNOSIS — G4700 Insomnia, unspecified: Secondary | ICD-10-CM

## 2015-06-23 DIAGNOSIS — Z9109 Other allergy status, other than to drugs and biological substances: Secondary | ICD-10-CM

## 2015-06-23 MED ORDER — LEVOCETIRIZINE DIHYDROCHLORIDE 5 MG PO TABS: 5.0000 mg | ORAL_TABLET | Freq: Every evening | ORAL | Status: DC

## 2015-06-23 MED ORDER — ZOLPIDEM TARTRATE 5 MG PO TABS: 5.0000 mg | ORAL_TABLET | Freq: Every evening | ORAL | Status: DC | PRN

## 2015-06-23 MED ORDER — IPRATROPIUM BROMIDE 0.06 % NA SOLN: 2.0000 | Freq: Three times a day (TID) | NASAL | Status: DC | PRN

## 2015-06-23 NOTE — Progress Notes (Signed)
Patient ID: Amanda Joyce, female    DOB: 1940/11/14  Age: 75 y.o. MRN: [id number]    Subjective:  Subjective HPI Amanda Joyce presents for R side scalp--- rough flaky.     Review of Systems  Constitutional: Negative for diaphoresis, appetite change, fatigue and unexpected weight change.  Eyes: Negative for pain, redness and visual disturbance.  Respiratory: Negative for cough, chest tightness, shortness of breath and wheezing.   Cardiovascular: Negative for chest pain, palpitations and leg swelling.  Endocrine: Negative for cold intolerance, heat intolerance, polydipsia, polyphagia and polyuria.  Genitourinary: Negative for dysuria, frequency and difficulty urinating.  Skin: Positive for color change.       + rough round spot on scalp  Neurological: Negative for dizziness, light-headedness, numbness and headaches.    History Past Medical History  Diagnosis Date  . Hypertension   . Depression   . Gout   . Temporomandibular joint disorders, unspecified   . Personal history of malignant neoplasm of breast   . 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 Irritable bowel syndrome   . Nontoxic uninodular goiter   . Hyperlipemia   . Hiatal hernia   . GERD (gastroesophageal reflux disease)   . History of stress test >10 yrs. ago    in [place], [place] - no need for follow up  . Anxiety   . Arthritis     neck, shoulders, back, knees   . Osteoarthrosis, unspecified whether generalized or localized, unspecified site     She has past surgical history that includes Appendectomy; Hernia repair; Abdominal hysterectomy; Thyroid surgery; Breast lumpectomy; Eye surgery (Bilateral); and Total knee arthroplasty (Right, 08/04/2014).   Her family history includes Alcohol abuse in her paternal uncle; Coronary artery disease in her father; Liver cancer in her paternal uncle; Other in her mother. There is no history of Colon cancer.She reports that she has never smoked. She has never used smokeless tobacco. She  reports that she does not drink alcohol or use illicit drugs.  Current Outpatient Prescriptions on File Prior to Visit  Medication Sig Dispense Refill  . atenolol (TENORMIN) 50 MG tablet TAKE 1 BY MOUTH TWICE DAILY 180 tablet 1  . Azelastine-Fluticasone 137-50 MCG/ACT SUSP Place 1 spray into the nose 2 (two) times daily as needed.    Marland Kitchen b complex vitamins capsule Take 1 capsule by mouth daily.    . Biotin 5000 MCG CAPS Take 5,000 mcg by mouth daily.    Marland Kitchen CARTIA XT 240 MG 24 hr capsule TAKE 1 BY MOUTH DAILY 90 capsule 1  . Cholecalciferol (VITAMIN D3) 2000 UNITS capsule Take 4,000 Units by mouth daily.    Marland Kitchen desvenlafaxine (PRISTIQ) 100 MG 24 hr tablet Take 100 mg by mouth daily.    Marland Kitchen ibuprofen (ADVIL,MOTRIN) 800 MG tablet Take 1 tablet (800 mg total) by mouth every 8 (eight) hours as needed. 90 tablet 0  . oxyCODONE-acetaminophen (ROXICET) 5-325 MG tablet Take 1-2 tablets by mouth every 4 (four) hours as needed. 90 tablet 0  . pravastatin (PRAVACHOL) 20 MG tablet TAKE 1 BY MOUTH DAILY 90 tablet 1  . quinapril (ACCUPRIL) 40 MG tablet TAKE 1 BY MOUTH DAILY 90 tablet 1  . triamcinolone cream (KENALOG) 0.1 % Apply 1 application topically 2 (two) times daily. 30 g 5   No current facility-administered medications on file prior to visit.     Objective:  Objective Physical Exam  Constitutional: She is oriented to person, place, and time. She appears  well-developed and well-nourished.  HENT:  Head: Normocephalic and atraumatic.  Rough round scaly spot on R side scalp  Eyes: Conjunctivae and EOM are normal.  Neck: Normal range of motion. Neck supple. No JVD present. Carotid bruit is not present. No thyromegaly present.  Cardiovascular: Normal rate, regular rhythm and normal heart sounds.   No murmur heard. Pulmonary/Chest: Effort normal and breath sounds normal. No respiratory distress. She has no wheezes. She has no rales. She exhibits no tenderness.  Musculoskeletal: She exhibits no edema.    Neurological: She is alert and oriented to person, place, and time.  Psychiatric: She has a normal mood and affect.  Nursing note and vitals reviewed.  BP 122/79 mmHg  Pulse 75  Temp(Src) 98.3 F (36.8 C) (Oral)  Ht 5\' 9"  (1.753 m)  Wt 168 lb 12.8 oz (76.567 kg)  BMI 24.92 kg/m2  SpO2 97% Wt Readings from Last 3 Encounters:  06/23/15 168 lb 12.8 oz (76.567 kg)  03/17/15 166 lb 9.6 oz (75.569 kg)  12/13/14 174 lb (78.926 kg)     Lab Results  Component Value Date   WBC 8.1 01/18/2015   HGB 14.5 01/18/2015   HCT 44.1 01/18/2015   PLT 343 01/18/2015   GLUCOSE 83 01/18/2015   CHOL 167 11/17/2014   TRIG 154.0* 11/17/2014   HDL 42.50 11/17/2014   LDLDIRECT 194.5 10/04/2010   LDLCALC 94 11/17/2014   ALT 11 01/18/2015   AST 16 01/18/2015   NA 142 01/18/2015   K 4.2 01/18/2015   CL 102 11/17/2014   CREATININE 0.9 01/18/2015   BUN 17.4 01/18/2015   CO2 30* 01/18/2015   TSH 2.51 11/17/2014   INR 1.04 07/23/2014   HGBA1C 6.1 07/03/2011    Mm Screening Breast Tomo Bilateral  01/27/2015  CLINICAL DATA:  Screening. EXAM: DIGITAL SCREENING BILATERAL MAMMOGRAM WITH 3D TOMO WITH CAD COMPARISON:  Previous exam(s). ACR Breast Density Category b: There are scattered areas of fibroglandular density. FINDINGS: There are no findings suspicious for malignancy. Images were processed with CAD. IMPRESSION: No mammographic evidence of malignancy. A result letter of this screening mammogram will be mailed directly to the patient. RECOMMENDATION: Screening mammogram in one year. (Code:SM-B-01Y) BI-RADS CATEGORY  1: Negative. Electronically Signed   By: Ammie Ferrier M.D.   On: 01/27/2015 08:14     Assessment & Plan:  Plan I have discontinued Ms. Spriggs ondansetron, methocarbamol, enoxaparin, ferrous sulfate, and ciprofloxacin. I am also having her start on levocetirizine. Additionally, I am having her maintain her Azelastine-Fluticasone, Vitamin D3, b complex vitamins, desvenlafaxine, Biotin,  triamcinolone cream, ibuprofen, quinapril, atenolol, CARTIA XT, pravastatin, oxyCODONE-acetaminophen, zolpidem, and ipratropium.  Meds ordered this encounter  Medications  . zolpidem (AMBIEN) 5 MG tablet    Sig: Take 1 tablet (5 mg total) by mouth at bedtime as needed for sleep.    Dispense:  15 tablet    Refill:  1  . ipratropium (ATROVENT) 0.06 % nasal spray    Sig: Place 2 sprays into the nose 3 (three) times daily as needed.    Dispense:  15 mL    Refill:  5  . levocetirizine (XYZAL) 5 MG tablet    Sig: Take 1 tablet (5 mg total) by mouth every evening.    Dispense:  30 tablet    Refill:  5    Problem List Items Addressed This Visit      Unprioritized   Insomnia - Primary   Relevant Medications   zolpidem (AMBIEN) 5 MG tablet  Other Visit Diagnoses    Suspicious nevus        Relevant Orders    Ambulatory referral to Dermatology    Asthma with acute exacerbation, mild persistent        Relevant Medications    ipratropium (ATROVENT) 0.06 % nasal spray    Environmental allergies        Relevant Medications    levocetirizine (XYZAL) 5 MG tablet       Follow-up: Return if symptoms worsen or fail to improve.  Garnet Koyanagi, DO

## 2015-06-23 NOTE — Patient Instructions (Signed)
Moles  Moles are usually harmless growths on the skin. They are accumulations of color (pigment) cells in the skin that:   · Can be various colors, from light brown to black.  · Can appear anywhere on the body.  · May remain flat or become raised.  · May contain hairs.  · May remain smooth or develop wrinkling.  Most moles are not cancerous (benign). However, some moles may develop changes and become cancerous. It is important to check your moles every month. If you check your moles regularly, you will be able to notice any changes that may occur.   CAUSES   Moles occur when skin cells grow together in clusters instead of spreading out in the skin as they normally do. The reason for this clustering is unknown.  DIAGNOSIS   Your caregiver will perform a skin examination to diagnose your mole.   TREATMENT   Moles usually do not require treatment. If a mole becomes worrisome, your caregiver may choose to take a sample of the mole or remove it entirely, and then send it to a lab for examination.   HOME CARE INSTRUCTIONS  · Check your mole(s) monthly for changes that may indicate skin cancer. These changes can include:    A change in size.    A change in color. Note that moles tend to darken during pregnancy or when taking birth control pills (oral contraception).    A change in shape.    A change in the border of the mole.  · Wear sunscreen (with an SPF of at least 30) when you spend long periods of time outside. Reapply the sunscreen every 2-3 hours.  · Schedule annual appointments with your skin doctor (dermatologist) if you have a large number of moles.  SEEK MEDICAL CARE IF:  · Your mole changes size, especially if it becomes larger than a pencil eraser.  · Your mole changes in color or develops more than one color.   · Your mole becomes itchy or bleeds.  · Your mole, or the skin near the mole, becomes painful, sore, red, or swollen.  · Your mole becomes scaly, sheds skin, or oozes fluid.   · Your mole develops  irregular borders.  · Your mole becomes flat or develops raised areas.  · Your mole becomes hard or soft.      This information is not intended to replace advice given to you by your health care provider. Make sure you discuss any questions you have with your health care provider.     Document Released: 01/16/2001 Document Revised: 01/16/2012 Document Reviewed: 11/05/2011  Elsevier Interactive Patient Education ©2016 [employer] Inc[employer]

## 2015-06-23 NOTE — Progress Notes (Signed)
Pre visit review using our clinic review tool, if applicable. No additional management support is needed unless otherwise documented below in the visit note. 

## 2015-06-28 ENCOUNTER — Encounter: Payer: Self-pay | Admitting: Family Medicine

## 2015-06-28 ENCOUNTER — Telehealth: Payer: Self-pay | Admitting: *Deleted

## 2015-06-28 NOTE — Telephone Encounter (Signed)
Received patient assistance application for Pristiq via [employer]; spoke with patient and informed her she needs to bring in financial documentation in and we can fax paperwork for her & she will retain the original; copy sent to scan/SLS 02/21

## 2015-06-29 NOTE — Telephone Encounter (Signed)
Patient brought in financial papers to go along with her patient assistance application; faxed all paperwork to [phone], made copies for scan, original placed back at front desk for patient records/pick-up; patient informed/SLS 02/22

## 2015-06-29 NOTE — Telephone Encounter (Signed)
Pt dropped off copy of document that was asked for her to bring.

## 2015-07-27 NOTE — Telephone Encounter (Signed)
Received Approval for [employer] patient assistance program for Pristiq ER Tablets 100 mg; patient ID# [id number]. Call [phone] to place orders [information placed in demographics], letter to scan/SLS 03/22

## 2015-08-01 DIAGNOSIS — L820 Inflamed seborrheic keratosis: Secondary | ICD-10-CM | POA: Diagnosis not present

## 2015-08-01 DIAGNOSIS — L821 Other seborrheic keratosis: Secondary | ICD-10-CM | POA: Diagnosis not present

## 2015-08-08 ENCOUNTER — Encounter: Payer: Self-pay | Admitting: Family Medicine

## 2015-08-08 ENCOUNTER — Ambulatory Visit (INDEPENDENT_AMBULATORY_CARE_PROVIDER_SITE_OTHER): Payer: Medicare Other | Admitting: Family Medicine

## 2015-08-08 VITALS — BP 118/82 | HR 80 | Temp 98.3°F | Wt 168.4 lb

## 2015-08-08 DIAGNOSIS — J069 Acute upper respiratory infection, unspecified: Secondary | ICD-10-CM

## 2015-08-08 MED ORDER — AZELASTINE-FLUTICASONE 137-50 MCG/ACT NA SUSP: 1.0000 | Freq: Two times a day (BID) | NASAL | Status: DC | PRN

## 2015-08-08 NOTE — Patient Instructions (Signed)
Upper Respiratory Infection, Adult Most upper respiratory infections (URIs) are a viral infection of the air passages leading to the lungs. A URI affects the nose, throat, and upper air passages. The most common type of URI is nasopharyngitis and is typically referred to as "the common cold." URIs run their course and usually go away on their own. Most of the time, a URI does not require medical attention, but sometimes a bacterial infection in the upper airways can follow a viral infection. This is called a secondary infection. Sinus and middle ear infections are common types of secondary upper respiratory infections. Bacterial pneumonia can also complicate a URI. A URI can worsen asthma and chronic obstructive pulmonary disease (COPD). Sometimes, these complications can require emergency medical care and may be life threatening.  CAUSES Almost all URIs are caused by viruses. A virus is a type of germ and can spread from one person to another.  RISKS FACTORS You may be at risk for a URI if:   You smoke.   You have chronic heart or lung disease.  You have a weakened defense (immune) system.   You are very young or very old.   You have nasal allergies or asthma.  You work in crowded or poorly ventilated areas.  You work in health care facilities or schools. SIGNS AND SYMPTOMS  Symptoms typically develop 2-3 days after you come in contact with a cold virus. Most viral URIs last 7-10 days. However, viral URIs from the influenza virus (flu virus) can last 14-18 days and are typically more severe. Symptoms may include:   Runny or stuffy (congested) nose.   Sneezing.   Cough.   Sore throat.   Headache.   Fatigue.   Fever.   Loss of appetite.   Pain in your forehead, behind your eyes, and over your cheekbones (sinus pain).  Muscle aches.  DIAGNOSIS  Your health care provider may diagnose a URI by:  Physical exam.  Tests to check that your symptoms are not due to  another condition such as:  Strep throat.  Sinusitis.  Pneumonia.  Asthma. TREATMENT  A URI goes away on its own with time. It cannot be cured with medicines, but medicines may be prescribed or recommended to relieve symptoms. Medicines may help:  Reduce your fever.  Reduce your cough.  Relieve nasal congestion. HOME CARE INSTRUCTIONS   Take medicines only as directed by your health care provider.   Gargle warm saltwater or take cough drops to comfort your throat as directed by your health care provider.  Use a warm mist humidifier or inhale steam from a shower to increase air moisture. This may make it easier to breathe.  Drink enough fluid to keep your urine clear or pale yellow.   Eat soups and other clear broths and maintain good nutrition.   Rest as needed.   Return to work when your temperature has returned to normal or as your health care provider advises. You may need to stay home longer to avoid infecting others. You can also use a face mask and careful hand washing to prevent spread of the virus.  Increase the usage of your inhaler if you have asthma.   Do not use any tobacco products, including cigarettes, chewing tobacco, or electronic cigarettes. If you need help quitting, ask your health care provider. PREVENTION  The best way to protect yourself from getting a cold is to practice good hygiene.   Avoid oral or hand contact with people with cold   symptoms.   Wash your hands often if contact occurs.  There is no clear evidence that vitamin C, vitamin E, echinacea, or exercise reduces the chance of developing a cold. However, it is always recommended to get plenty of rest, exercise, and practice good nutrition.  SEEK MEDICAL CARE IF:   You are getting worse rather than better.   Your symptoms are not controlled by medicine.   You have chills.  You have worsening shortness of breath.  You have brown or red mucus.  You have yellow or brown nasal  discharge.  You have pain in your face, especially when you bend forward.  You have a fever.  You have swollen neck glands.  You have pain while swallowing.  You have white areas in the back of your throat. SEEK IMMEDIATE MEDICAL CARE IF:   You have severe or persistent:  Headache.  Ear pain.  Sinus pain.  Chest pain.  You have chronic lung disease and any of the following:  Wheezing.  Prolonged cough.  Coughing up blood.  A change in your usual mucus.  You have a stiff neck.  You have changes in your:  Vision.  Hearing.  Thinking.  Mood. MAKE SURE YOU:   Understand these instructions.  Will watch your condition.  Will get help right away if you are not doing well or get worse.   This information is not intended to replace advice given to you by your health care provider. Make sure you discuss any questions you have with your health care provider.   Document Released: 10/17/2000 Document Revised: 09/07/2014 Document Reviewed: 07/29/2013 Elsevier Interactive Patient Education 2016 Elsevier Inc.  

## 2015-08-08 NOTE — Progress Notes (Signed)
Pre visit review using our clinic review tool, if applicable. No additional management support is needed unless otherwise documented below in the visit note. 

## 2015-08-08 NOTE — Progress Notes (Signed)
Patient ID: Amanda Joyce, female    DOB: 1941/04/18  Age: 75 y.o. MRN: [id number]    Subjective:  Subjective HPI Amanda Joyce presents for c/o upper resp infection.  It started 3 days ago.  She is taking mucinex which is helping.    Review of Systems  Constitutional: Negative for diaphoresis, appetite change, fatigue and unexpected weight change.  Eyes: Negative for pain, redness and visual disturbance.  Respiratory: Negative for cough, chest tightness, shortness of breath and wheezing.   Cardiovascular: Negative for chest pain, palpitations and leg swelling.  Endocrine: Negative for cold intolerance, heat intolerance, polydipsia, polyphagia and polyuria.  Genitourinary: Negative for dysuria, frequency and difficulty urinating.  Neurological: Negative for dizziness, light-headedness, numbness and headaches.    History Past Medical History  Diagnosis Date  . Hypertension   . Depression   . Gout   . Temporomandibular joint disorders, unspecified   . Personal history of malignant neoplasm of breast   . Breast cancer ([place])     Left   . Irritable bowel syndrome   . Nontoxic uninodular goiter   . Hyperlipemia   . Hiatal hernia   . GERD (gastroesophageal reflux disease)   . History of stress test >10 yrs. ago    in [place], [place] - no need for follow up  . Anxiety   . Arthritis     neck, shoulders, back, knees   . Osteoarthrosis, unspecified whether generalized or localized, unspecified site     She has past surgical history that includes Appendectomy; Hernia repair; Abdominal hysterectomy; Thyroid surgery; Breast lumpectomy; Eye surgery (Bilateral); and Total knee arthroplasty (Right, 08/04/2014).   Her family history includes Alcohol abuse in her paternal uncle; Coronary artery disease in her father; Liver cancer in her paternal uncle; Other in her mother. There is no history of Colon cancer.She reports that she has never smoked. She has never used smokeless tobacco. She reports  that she does not drink alcohol or use illicit drugs.  Current Outpatient Prescriptions on File Prior to Visit  Medication Sig Dispense Refill  . atenolol (TENORMIN) 50 MG tablet TAKE 1 BY MOUTH TWICE DAILY 180 tablet 1  . b complex vitamins capsule Take 1 capsule by mouth daily.    . Biotin 5000 MCG CAPS Take 5,000 mcg by mouth daily.    Marland Kitchen CARTIA XT 240 MG 24 hr capsule TAKE 1 BY MOUTH DAILY 90 capsule 1  . Cholecalciferol (VITAMIN D3) 2000 UNITS capsule Take 4,000 Units by mouth daily.    Marland Kitchen desvenlafaxine (PRISTIQ) 100 MG 24 hr tablet Take 100 mg by mouth daily.    Marland Kitchen ibuprofen (ADVIL,MOTRIN) 800 MG tablet Take 1 tablet (800 mg total) by mouth every 8 (eight) hours as needed. 90 tablet 0  . ipratropium (ATROVENT) 0.06 % nasal spray Place 2 sprays into the nose 3 (three) times daily as needed. 15 mL 5  . levocetirizine (XYZAL) 5 MG tablet Take 1 tablet (5 mg total) by mouth every evening. 30 tablet 5  . oxyCODONE-acetaminophen (ROXICET) 5-325 MG tablet Take 1-2 tablets by mouth every 4 (four) hours as needed. 90 tablet 0  . pravastatin (PRAVACHOL) 20 MG tablet TAKE 1 BY MOUTH DAILY 90 tablet 1  . quinapril (ACCUPRIL) 40 MG tablet TAKE 1 BY MOUTH DAILY 90 tablet 1  . triamcinolone cream (KENALOG) 0.1 % Apply 1 application topically 2 (two) times daily. 30 g 5  . zolpidem (AMBIEN) 5 MG tablet Take 1 tablet (5 mg total) by mouth  at bedtime as needed for sleep. 15 tablet 1   No current facility-administered medications on file prior to visit.     Objective:  Objective Physical Exam  Constitutional: She is oriented to person, place, and time. She appears well-developed and well-nourished.  HENT:  Head: Normocephalic and atraumatic.  Right Ear: External ear normal.  Left Ear: External ear normal.  Mouth/Throat: Posterior oropharyngeal erythema present. No oropharyngeal exudate, posterior oropharyngeal edema or tonsillar abscesses.  + PND + errythema  Eyes: Conjunctivae and EOM are normal.  Right eye exhibits no discharge. Left eye exhibits no discharge.  Neck: Normal range of motion. Neck supple. No JVD present. Carotid bruit is not present. No thyromegaly present.  Cardiovascular: Normal rate, regular rhythm and normal heart sounds.   No murmur heard. Pulmonary/Chest: Effort normal and breath sounds normal. No respiratory distress. She has no wheezes. She has no rales. She exhibits no tenderness.  Musculoskeletal: She exhibits no edema.  Lymphadenopathy:    She has cervical adenopathy.  Neurological: She is alert and oriented to person, place, and time.  Psychiatric: She has a normal mood and affect. Her behavior is normal. Thought content normal.  Nursing note and vitals reviewed.  BP 118/82 mmHg  Pulse 80  Temp(Src) 98.3 F (36.8 C) (Oral)  Wt 168 lb 6.4 oz (76.386 kg)  SpO2 97% Wt Readings from Last 3 Encounters:  08/08/15 168 lb 6.4 oz (76.386 kg)  06/23/15 168 lb 12.8 oz (76.567 kg)  03/17/15 166 lb 9.6 oz (75.569 kg)     Lab Results  Component Value Date   WBC 8.1 01/18/2015   HGB 14.5 01/18/2015   HCT 44.1 01/18/2015   PLT 343 01/18/2015   GLUCOSE 83 01/18/2015   CHOL 167 11/17/2014   TRIG 154.0* 11/17/2014   HDL 42.50 11/17/2014   LDLDIRECT 194.5 10/04/2010   LDLCALC 94 11/17/2014   ALT 11 01/18/2015   AST 16 01/18/2015   NA 142 01/18/2015   K 4.2 01/18/2015   CL 102 11/17/2014   CREATININE 0.9 01/18/2015   BUN 17.4 01/18/2015   CO2 30* 01/18/2015   TSH 2.51 11/17/2014   INR 1.04 07/23/2014   HGBA1C 6.1 07/03/2011    Mm Screening Breast Tomo Bilateral  01/27/2015  CLINICAL DATA:  Screening. EXAM: DIGITAL SCREENING BILATERAL MAMMOGRAM WITH 3D TOMO WITH CAD COMPARISON:  Previous exam(s). ACR Breast Density Category b: There are scattered areas of fibroglandular density. FINDINGS: There are no findings suspicious for malignancy. Images were processed with CAD. IMPRESSION: No mammographic evidence of malignancy. A result letter of this screening  mammogram will be mailed directly to the patient. RECOMMENDATION: Screening mammogram in one year. (Code:SM-B-01Y) BI-RADS CATEGORY  1: Negative. Electronically Signed   By: Ammie Ferrier M.D.   On: 01/27/2015 08:14     Assessment & Plan:  Plan I am having Ms. Attanasio maintain her Vitamin D3, b complex vitamins, desvenlafaxine, Biotin, triamcinolone cream, ibuprofen, quinapril, atenolol, CARTIA XT, pravastatin, oxyCODONE-acetaminophen, zolpidem, ipratropium, levocetirizine, and Azelastine-Fluticasone.  Meds ordered this encounter  Medications  . Azelastine-Fluticasone 137-50 MCG/ACT SUSP    Sig: Place 1 spray into the nose 2 (two) times daily as needed.    Dispense:  1 Bottle    Refill:  5    Problem List Items Addressed This Visit    None    Visit Diagnoses    Acute upper respiratory infection    -  Primary    Relevant Medications    Azelastine-Fluticasone 137-50 MCG/ACT SUSP  take the xyzal and con't mucinex Call if symptoms worsen or to not improve  Follow-up: Return if symptoms worsen or fail to improve.  Ann Held, DO

## 2015-08-09 ENCOUNTER — Other Ambulatory Visit: Payer: Self-pay | Admitting: Family Medicine

## 2015-08-09 ENCOUNTER — Telehealth: Payer: Self-pay

## 2015-08-09 DIAGNOSIS — J302 Other seasonal allergic rhinitis: Secondary | ICD-10-CM

## 2015-08-09 MED ORDER — AZELASTINE HCL 0.1 % NA SOLN: 1.0000 | Freq: Two times a day (BID) | NASAL | Status: DC

## 2015-08-09 NOTE — Telephone Encounter (Signed)
Pt notified and made aware.  She was appreciative.  No additional questions or concerns voiced at this time.

## 2015-08-09 NOTE — Telephone Encounter (Signed)
-----   Message from Ann Held, DO sent at 08/09/2015  8:38 AM EDT ----- Her insurance will not cover the dymista------it is a combo spray--- I can send them separately----- I sent the astelin in ---- I believe she has flonase at home--- she can use the two together.

## 2015-08-23 ENCOUNTER — Telehealth: Payer: Self-pay | Admitting: Family Medicine

## 2015-08-23 DIAGNOSIS — G894 Chronic pain syndrome: Secondary | ICD-10-CM

## 2015-08-23 MED ORDER — OXYCODONE-ACETAMINOPHEN 5-325 MG PO TABS: 1.0000 | ORAL_TABLET | ORAL | Status: DC | PRN

## 2015-08-23 NOTE — Telephone Encounter (Signed)
Refill x1 

## 2015-08-23 NOTE — Telephone Encounter (Signed)
Patient aware Rx ready for pick up.      KP 

## 2015-08-23 NOTE — Telephone Encounter (Signed)
°

## 2015-08-23 NOTE — Telephone Encounter (Signed)
Last seen 08/08/15 and filled 06/20/15 #90 UDS 04/19/15 low risk   Please advise     KP

## 2015-09-26 ENCOUNTER — Telehealth: Payer: Self-pay | Admitting: Family Medicine

## 2015-09-26 DIAGNOSIS — G894 Chronic pain syndrome: Secondary | ICD-10-CM

## 2015-09-26 NOTE — Telephone Encounter (Signed)
Need contract on file

## 2015-09-26 NOTE — Telephone Encounter (Signed)
Refill request for a 90 day supply oxyCODONE    CB: [phone]

## 2015-09-26 NOTE — Telephone Encounter (Signed)
Last filled: 08/23/15 Amt: 90, 0 Last OV:  12/13/14-Chronic Pain 04/15/15 no control substance contract sign, uds sample given, Low risk, next screen 10/14/15.  Please advise.

## 2015-09-27 MED ORDER — OXYCODONE-ACETAMINOPHEN 5-325 MG PO TABS: 1.0000 | ORAL_TABLET | ORAL | Status: DC | PRN

## 2015-09-27 NOTE — Telephone Encounter (Signed)
Rx printed and forwarded to PCP for review and signature.  Contract printed and attached.

## 2015-09-27 NOTE — Telephone Encounter (Signed)
Ok to refill x1,  Need contract and ? UDS if not done

## 2015-09-27 NOTE — Telephone Encounter (Signed)
Ok to refill 

## 2015-10-17 ENCOUNTER — Telehealth: Payer: Self-pay | Admitting: Family Medicine

## 2015-10-17 ENCOUNTER — Other Ambulatory Visit: Payer: Medicare Other

## 2015-10-17 DIAGNOSIS — R300 Dysuria: Secondary | ICD-10-CM | POA: Diagnosis not present

## 2015-10-17 DIAGNOSIS — R350 Frequency of micturition: Secondary | ICD-10-CM

## 2015-10-17 NOTE — Telephone Encounter (Signed)
Can drop off med-- but will need to wait for results to see if med needed

## 2015-10-17 NOTE — Telephone Encounter (Signed)
Notified pt. She will come in around 3:30pm. Lab orders entered.

## 2015-10-17 NOTE — Telephone Encounter (Signed)
Please advise      KP 

## 2015-10-17 NOTE — Telephone Encounter (Signed)
Pt called in because she says that she has a UTI (burning and frequent urination)  Pt would like to drop off a urine sample. Pt was last seen on 4/3 for a sore throat. Pt would like to know if she could just drop off sample and then receive a medication ?    Please advise .   [id number]

## 2015-10-18 ENCOUNTER — Other Ambulatory Visit: Payer: Medicare Other

## 2015-10-19 ENCOUNTER — Other Ambulatory Visit: Payer: Self-pay | Admitting: Family Medicine

## 2015-10-19 DIAGNOSIS — N390 Urinary tract infection, site not specified: Secondary | ICD-10-CM

## 2015-10-19 LAB — URINE CULTURE: Colony Count: >=100000

## 2015-10-19 MED ORDER — CIPROFLOXACIN HCL 500 MG PO TABS: 500.0000 mg | ORAL_TABLET | Freq: Two times a day (BID) | ORAL | Status: DC

## 2015-10-21 ENCOUNTER — Other Ambulatory Visit: Payer: Self-pay | Admitting: Behavioral Health

## 2015-10-21 NOTE — Telephone Encounter (Signed)
Opened in error

## 2015-10-25 ENCOUNTER — Encounter: Payer: Self-pay | Admitting: Gastroenterology

## 2015-10-26 ENCOUNTER — Other Ambulatory Visit: Payer: Self-pay | Admitting: Family Medicine

## 2015-10-26 NOTE — Telephone Encounter (Signed)
°  Relationship to patient: Self Can be reached: [phone]    Reason for call: Refill on desvenlafaxine (PRISTIQ) 100 MG 24 hr tablet [id number]

## 2015-10-26 NOTE — Telephone Encounter (Signed)
We have it as a historical medication-we have never filled this prescription. Last OV:08/08/15-Acute Please advise.//AB/CMA

## 2015-10-27 ENCOUNTER — Telehealth: Payer: Self-pay | Admitting: Family Medicine

## 2015-10-27 ENCOUNTER — Other Ambulatory Visit: Payer: Self-pay | Admitting: Family Medicine

## 2015-10-27 MED ORDER — DESVENLAFAXINE SUCCINATE ER 100 MG PO TB24: 100.0000 mg | ORAL_TABLET | Freq: Every day | ORAL | Status: DC

## 2015-10-27 NOTE — Telephone Encounter (Signed)
Refill x1---  5 refills 

## 2015-10-27 NOTE — Telephone Encounter (Signed)
°  Relationship to patient: Self Can be reached: [phone]     Reason for call: Patient states she is ready for some more desvenlafaxine (PRISTIQ) 100 MG 24 hr tablet [id number]

## 2015-10-27 NOTE — Telephone Encounter (Signed)
Pristiq ordered, order [id number], order will come in 5-7 days.   KP

## 2015-10-27 NOTE — Telephone Encounter (Signed)
Pristiq ok'd by Dr. Etter Sjogren,  ordered per patient request.

## 2015-10-28 ENCOUNTER — Telehealth: Payer: Self-pay | Admitting: Family Medicine

## 2015-10-28 DIAGNOSIS — G894 Chronic pain syndrome: Secondary | ICD-10-CM

## 2015-10-28 NOTE — Telephone Encounter (Signed)
Relation to [id number] Call back [phone]   Reason for call:  Patient requesting oxyCODONE-acetaminophen (ROXICET) 5-325 MG tablet

## 2015-10-31 MED ORDER — OXYCODONE-ACETAMINOPHEN 5-325 MG PO TABS: 1.0000 | ORAL_TABLET | ORAL | Status: DC | PRN

## 2015-10-31 NOTE — Telephone Encounter (Signed)
Script printed and placed in red folder for signature. JG//CMA

## 2015-10-31 NOTE — Telephone Encounter (Signed)
Last seen 08/08/15 and filled 09/27/15 #90 UDS 04/25/15 low risk    Please advise    KP

## 2015-10-31 NOTE — Telephone Encounter (Signed)
Called and informed pt that script is ready for pick up at our front desk. She verbalized understanding, no further questions/concerns. JG//CMA

## 2015-10-31 NOTE — Telephone Encounter (Signed)
Refill x1 

## 2015-11-01 NOTE — Telephone Encounter (Signed)
Patient is aware Pristiq is ready for pick up.     KP

## 2015-12-01 ENCOUNTER — Telehealth: Payer: Self-pay | Admitting: Family Medicine

## 2015-12-01 DIAGNOSIS — G894 Chronic pain syndrome: Secondary | ICD-10-CM

## 2015-12-01 DIAGNOSIS — G4700 Insomnia, unspecified: Secondary | ICD-10-CM

## 2015-12-01 MED ORDER — OXYCODONE-ACETAMINOPHEN 5-325 MG PO TABS: 1.0000 | ORAL_TABLET | ORAL | 0 refills | Status: DC | PRN

## 2015-12-01 MED ORDER — ZOLPIDEM TARTRATE 5 MG PO TABS: 5.0000 mg | ORAL_TABLET | Freq: Every evening | ORAL | 1 refills | Status: DC | PRN

## 2015-12-01 NOTE — Telephone Encounter (Signed)
°  Relation to [id number] Call back number:[phone] Pharmacy:  Reason for call:  Patient requesting a refill oxyCODONE-acetaminophen (ROXICET) 5-325 MG tablet and zolpidem (AMBIEN) 5 MG tablet

## 2015-12-01 NOTE — Telephone Encounter (Signed)
Last seen  08/08/15 and filled  Oxy 10/31/15 #90 Ambien 06/23/15 #15 with 1  UDS 04/15/15 low risk   Please advise    KP

## 2015-12-01 NOTE — Telephone Encounter (Signed)
Patient is aware the Rx is ready for pick up.    KP

## 2015-12-01 NOTE — Telephone Encounter (Signed)
Oxy x1 ambien x1  wth 1 refill

## 2015-12-08 ENCOUNTER — Telehealth: Payer: Self-pay

## 2015-12-08 MED ORDER — QUINAPRIL HCL 40 MG PO TABS: ORAL_TABLET | ORAL | 1 refills | Status: DC

## 2015-12-08 MED ORDER — DILTIAZEM HCL ER COATED BEADS 240 MG PO CP24: ORAL_CAPSULE | ORAL | 1 refills | Status: DC

## 2015-12-08 MED ORDER — ATENOLOL 50 MG PO TABS: ORAL_TABLET | ORAL | 1 refills | Status: DC

## 2015-12-08 MED ORDER — PRAVASTATIN SODIUM 20 MG PO TABS: ORAL_TABLET | ORAL | 1 refills | Status: DC

## 2015-12-28 ENCOUNTER — Other Ambulatory Visit: Payer: Self-pay | Admitting: Family Medicine

## 2015-12-28 DIAGNOSIS — Z1231 Encounter for screening mammogram for malignant neoplasm of breast: Secondary | ICD-10-CM

## 2016-01-02 ENCOUNTER — Encounter: Payer: Self-pay | Admitting: Family Medicine

## 2016-01-02 ENCOUNTER — Ambulatory Visit (INDEPENDENT_AMBULATORY_CARE_PROVIDER_SITE_OTHER): Payer: Medicare Other | Admitting: Family Medicine

## 2016-01-02 ENCOUNTER — Ambulatory Visit (HOSPITAL_BASED_OUTPATIENT_CLINIC_OR_DEPARTMENT_OTHER)
Admission: RE | Admit: 2016-01-02 | Discharge: 2016-01-02 | Disposition: A | Discharge status: Home / Self Care | Payer: Medicare Other | Source: Ambulatory Visit | Attending: Family Medicine | Admitting: Family Medicine

## 2016-01-02 VITALS — BP 124/82 | HR 52 | Temp 98.2°F | Ht 69.0 in | Wt 158.8 lb

## 2016-01-02 DIAGNOSIS — M47892 Other spondylosis, cervical region: Secondary | ICD-10-CM | POA: Insufficient documentation

## 2016-01-02 DIAGNOSIS — R0781 Pleurodynia: Secondary | ICD-10-CM | POA: Diagnosis not present

## 2016-01-02 DIAGNOSIS — R0789 Other chest pain: Secondary | ICD-10-CM | POA: Diagnosis not present

## 2016-01-02 DIAGNOSIS — M542 Cervicalgia: Secondary | ICD-10-CM | POA: Diagnosis not present

## 2016-01-02 DIAGNOSIS — I10 Essential (primary) hypertension: Secondary | ICD-10-CM | POA: Diagnosis not present

## 2016-01-02 DIAGNOSIS — E785 Hyperlipidemia, unspecified: Secondary | ICD-10-CM

## 2016-01-02 DIAGNOSIS — G894 Chronic pain syndrome: Secondary | ICD-10-CM

## 2016-01-02 DIAGNOSIS — M47812 Spondylosis without myelopathy or radiculopathy, cervical region: Secondary | ICD-10-CM | POA: Diagnosis not present

## 2016-01-02 DIAGNOSIS — F411 Generalized anxiety disorder: Secondary | ICD-10-CM

## 2016-01-02 LAB — CBC WITH DIFFERENTIAL/PLATELET
BASOS ABS: 0 10*3/uL (ref 0.0–0.1)
Basophils Relative: 0.7 % (ref 0.0–3.0)
Eosinophils Absolute: 0.2 10*3/uL (ref 0.0–0.7)
Eosinophils Relative: 2.8 % (ref 0.0–5.0)
HCT: 41.7 % (ref 36.0–46.0)
Hemoglobin: 14.2 g/dL (ref 12.0–15.0)
LYMPHS ABS: 2 10*3/uL (ref 0.7–4.0)
Lymphocytes Relative: 29.6 % (ref 12.0–46.0)
MCHC: 34.1 g/dL (ref 30.0–36.0)
MCV: 88.4 fl (ref 78.0–100.0)
MONOS PCT: 8.3 % (ref 3.0–12.0)
Monocytes Absolute: 0.6 10*3/uL (ref 0.1–1.0)
NEUTROS ABS: 3.9 10*3/uL (ref 1.4–7.7)
NEUTROS PCT: 58.6 % (ref 43.0–77.0)
PLATELETS: 298 10*3/uL (ref 150.0–400.0)
RBC: 4.71 Mil/uL (ref 3.87–5.11)
RDW: 12.8 % (ref 11.5–15.5)
WBC: 6.7 10*3/uL (ref 4.0–10.5)

## 2016-01-02 LAB — LIPID PANEL
CHOL/HDL RATIO: 4
Cholesterol: 177 mg/dL (ref 0–200)
HDL: 41.3 mg/dL (ref >39.00)
NONHDL: 135.37
Triglycerides: 228 mg/dL — ABNORMAL HIGH (ref 0.0–149.0)
VLDL: 45.6 mg/dL — ABNORMAL HIGH (ref 0.0–40.0)

## 2016-01-02 LAB — COMPREHENSIVE METABOLIC PANEL
ALT: 12 U/L (ref 0–35)
AST: 17 U/L (ref 0–37)
Albumin: 4.2 g/dL (ref 3.5–5.2)
Alkaline Phosphatase: 50 U/L (ref 39–117)
BILIRUBIN TOTAL: 0.7 mg/dL (ref 0.2–1.2)
BUN: 15 mg/dL (ref 6–23)
CO2: 33 meq/L — AB (ref 19–32)
CREATININE: 0.76 mg/dL (ref 0.40–1.20)
Calcium: 9.9 mg/dL (ref 8.4–10.5)
Chloride: 102 mEq/L (ref 96–112)
GFR: 78.76 mL/min (ref >60.00)
GLUCOSE: 113 mg/dL — AB (ref 70–99)
Potassium: 3.9 mEq/L (ref 3.5–5.1)
Sodium: 140 mEq/L (ref 135–145)
Total Protein: 6.9 g/dL (ref 6.0–8.3)

## 2016-01-02 LAB — LDL CHOLESTEROL, DIRECT: LDL DIRECT: 106 mg/dL

## 2016-01-02 MED ORDER — DILTIAZEM HCL ER COATED BEADS 240 MG PO CP24: ORAL_CAPSULE | ORAL | 1 refills | Status: DC

## 2016-01-02 MED ORDER — QUINAPRIL HCL 40 MG PO TABS: ORAL_TABLET | ORAL | 1 refills | Status: DC

## 2016-01-02 MED ORDER — PRAVASTATIN SODIUM 20 MG PO TABS: ORAL_TABLET | ORAL | 1 refills | Status: DC

## 2016-01-02 MED ORDER — OXYCODONE-ACETAMINOPHEN 5-325 MG PO TABS: 1.0000 | ORAL_TABLET | ORAL | 0 refills | Status: DC | PRN

## 2016-01-02 MED ORDER — DESVENLAFAXINE SUCCINATE ER 100 MG PO TB24: 100.0000 mg | ORAL_TABLET | Freq: Every day | ORAL | Status: DC

## 2016-01-02 NOTE — Progress Notes (Signed)
Patient ID: Amanda Joyce, female    DOB: [birth year]  Age: 75 y.o. MRN: [id number]    Subjective:  Subjective  HPI Amanda Joyce presents for R flank pain over low ribs.  No known injury.  No abc pain,  No NVD.    Review of Systems  Constitutional: Negative for activity change, appetite change, fatigue and unexpected weight change.  Respiratory: Negative for cough and shortness of breath.   Cardiovascular: Negative for chest pain and palpitations.  Psychiatric/Behavioral: Negative for behavioral problems and dysphoric mood. The patient is not nervous/anxious.     History Past Medical History:  Diagnosis Date  . Anxiety   . Arthritis    neck, shoulders, back, knees   . Breast cancer ([place])    Left   . Depression   . GERD (gastroesophageal reflux disease)   . Gout   . Hiatal hernia   . History of stress test >10 yrs. ago   in [place], [place] - no need for follow up  . Hyperlipemia   . Hypertension   . Irritable bowel syndrome   . Nontoxic uninodular goiter   . Osteoarthrosis, unspecified whether generalized or localized, unspecified site   . Personal history of malignant neoplasm of breast   . Temporomandibular joint disorders, unspecified     She has a past surgical history that includes Appendectomy; Hernia repair; Abdominal hysterectomy; Thyroid surgery; Breast lumpectomy; Eye surgery (Bilateral); and Total knee arthroplasty (Right, 08/04/2014).   Her family history includes Alcohol abuse in her paternal uncle; Coronary artery disease in her father; Liver cancer in her paternal uncle; Other in her mother.She reports that she has never smoked. She has never used smokeless tobacco. She reports that she does not drink alcohol or use drugs.  Current Outpatient Prescriptions on File Prior to Visit  Medication Sig Dispense Refill  . atenolol (TENORMIN) 50 MG tablet TAKE 1 BY MOUTH TWICE DAILY 180 tablet 1  . azelastine (ASTELIN) 0.1 % nasal spray Place 1 spray into both nostrils 2  (two) times daily. Use in each nostril as directed 30 mL 12  . Azelastine-Fluticasone 137-50 MCG/ACT SUSP Place 1 spray into the nose 2 (two) times daily as needed. 1 Bottle 5  . b complex vitamins capsule Take 1 capsule by mouth daily.    . Biotin 5000 MCG CAPS Take 5,000 mcg by mouth daily.    . Cholecalciferol (VITAMIN D3) 2000 UNITS capsule Take 4,000 Units by mouth daily.    Marland Kitchen desvenlafaxine (PRISTIQ) 100 MG 24 hr tablet Take 1 tablet (100 mg total) by mouth daily. 30 tablet 5  . ibuprofen (ADVIL,MOTRIN) 800 MG tablet Take 1 tablet (800 mg total) by mouth every 8 (eight) hours as needed. 90 tablet 0  . ipratropium (ATROVENT) 0.06 % nasal spray Place 2 sprays into the nose 3 (three) times daily as needed. 15 mL 5  . levocetirizine (XYZAL) 5 MG tablet Take 1 tablet (5 mg total) by mouth every evening. 30 tablet 5  . triamcinolone cream (KENALOG) 0.1 % Apply 1 application topically 2 (two) times daily. 30 g 5  . zolpidem (AMBIEN) 5 MG tablet Take 1 tablet (5 mg total) by mouth at bedtime as needed for sleep. 15 tablet 1   No current facility-administered medications on file prior to visit.      Objective:  Objective  Physical Exam  Constitutional: She is oriented to person, place, and time. She appears well-developed and well-nourished.  HENT:  Head: Normocephalic and atraumatic.  Eyes: Conjunctivae and EOM are normal.  Neck: Normal range of motion. Neck supple. No JVD present. Carotid bruit is not present. No thyromegaly present.  Cardiovascular: Normal rate, regular rhythm and normal heart sounds.   No murmur heard. Pulmonary/Chest: Effort normal and breath sounds normal. No respiratory distress. She has no wheezes. She has no rales. She exhibits no tenderness.  Musculoskeletal: She exhibits no edema.  Neurological: She is alert and oriented to person, place, and time.  Psychiatric: She has a normal mood and affect. Her behavior is normal. Judgment and thought content normal.    Nursing note and vitals reviewed.  BP 124/82 (BP Location: Right Arm, Patient Position: Sitting, Cuff Size: Normal)   Pulse (!) 52   Temp 98.2 F (36.8 C) (Oral)   Ht 5\' 9"  (1.753 m)   Wt 158 lb 12.8 oz (72 kg)   SpO2 96%   BMI 23.45 kg/m  Wt Readings from Last 3 Encounters:  01/02/16 158 lb 12.8 oz (72 kg)  08/08/15 168 lb 6.4 oz (76.4 kg)  06/23/15 168 lb 12.8 oz (76.6 kg)     Lab Results  Component Value Date   WBC 6.7 01/02/2016   HGB 14.2 01/02/2016   HCT 41.7 01/02/2016   PLT 298.0 01/02/2016   GLUCOSE 113 (H) 01/02/2016   CHOL 177 01/02/2016   TRIG 228.0 (H) 01/02/2016   HDL 41.30 01/02/2016   LDLDIRECT 106.0 01/02/2016   LDLCALC 94 11/17/2014   ALT 12 01/02/2016   AST 17 01/02/2016   NA 140 01/02/2016   K 3.9 01/02/2016   CL 102 01/02/2016   CREATININE 0.76 01/02/2016   BUN 15 01/02/2016   CO2 33 (H) 01/02/2016   TSH 2.51 11/17/2014   INR 1.04 07/23/2014   HGBA1C 6.1 07/03/2011    Mm Screening Breast Tomo Bilateral  Result Date: 01/27/2015 CLINICAL DATA:  Screening. EXAM: DIGITAL SCREENING BILATERAL MAMMOGRAM WITH 3D TOMO WITH CAD COMPARISON:  Previous exam(s). ACR Breast Density Category b: There are scattered areas of fibroglandular density. FINDINGS: There are no findings suspicious for malignancy. Images were processed with CAD. IMPRESSION: No mammographic evidence of malignancy. A result letter of this screening mammogram will be mailed directly to the patient. RECOMMENDATION: Screening mammogram in one year. (Code:SM-B-01Y) BI-RADS CATEGORY  1: Negative. Electronically Signed   By: Ammie Ferrier M.D.   On: 01/27/2015 08:14     Assessment & Plan:  Plan  I have discontinued Ms. Dusseault ciprofloxacin. I have also changed her desvenlafaxine. Additionally, I am having her maintain her Vitamin D3, b complex vitamins, Biotin, triamcinolone cream, ibuprofen, ipratropium, levocetirizine, Azelastine-Fluticasone, azelastine, desvenlafaxine, zolpidem,  atenolol, oxyCODONE-acetaminophen, quinapril, pravastatin, and diltiazem.  Meds ordered this encounter  Medications  . oxyCODONE-acetaminophen (ROXICET) 5-325 MG tablet    Sig: Take 1-2 tablets by mouth every 4 (four) hours as needed.    Dispense:  90 tablet    Refill:  0  . quinapril (ACCUPRIL) 40 MG tablet    Sig: TAKE 1 BY MOUTH DAILY    Dispense:  90 tablet    Refill:  1  . pravastatin (PRAVACHOL) 20 MG tablet    Sig: TAKE 1 BY MOUTH DAILY    Dispense:  90 tablet    Refill:  1  . diltiazem (CARTIA XT) 240 MG 24 hr capsule    Sig: TAKE 1 BY MOUTH DAILY    Dispense:  90 capsule    Refill:  1  . desvenlafaxine (PRISTIQ) 100 MG 24 hr tablet  Sig: Take 1 tablet (100 mg total) by mouth daily.    Problem List Items Addressed This Visit    None    Visit Diagnoses    Neck pain    -  Primary   Relevant Orders   DG Cervical Spine Complete (Completed)   Chronic pain syndrome       Relevant Medications   oxyCODONE-acetaminophen (ROXICET) 5-325 MG tablet   Rib pain on right side       Relevant Orders   DG Ribs Unilateral W/Chest Right (Completed)   Hyperlipidemia       Relevant Medications   quinapril (ACCUPRIL) 40 MG tablet   pravastatin (PRAVACHOL) 20 MG tablet   diltiazem (CARTIA XT) 240 MG 24 hr capsule   Other Relevant Orders   Lipid panel (Completed)   Comprehensive metabolic panel (Completed)   LDL cholesterol, direct (Completed)   Essential hypertension       Relevant Medications   quinapril (ACCUPRIL) 40 MG tablet   pravastatin (PRAVACHOL) 20 MG tablet   diltiazem (CARTIA XT) 240 MG 24 hr capsule   Other Relevant Orders   CBC with Differential/Platelet (Completed)   Generalized anxiety disorder       Relevant Medications   desvenlafaxine (PRISTIQ) 100 MG 24 hr tablet      Follow-up: Return in about 6 months (around 07/04/2016) for annual exam, fasting.  Ann Held, DO

## 2016-01-02 NOTE — Patient Instructions (Signed)

## 2016-01-02 NOTE — Progress Notes (Signed)
Pre visit review using our clinic review tool, if applicable. No additional management support is needed unless otherwise documented below in the visit note. 

## 2016-01-06 MED ORDER — METOPROLOL TARTRATE 25 MG PO TABS: 25.0000 mg | ORAL_TABLET | Freq: Two times a day (BID) | ORAL | 0 refills | Status: DC

## 2016-01-06 NOTE — Telephone Encounter (Signed)
Atenolol is no longer being manufactured, please advise on a new medication for her BP.     KP

## 2016-01-06 NOTE — Telephone Encounter (Signed)
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 Pt says that she has a week left. Pt would like to have a supply sent to her local [facility]) to be sure that she doesn't run out until back in stock.    Please advise pt further.    Thanks.

## 2016-01-06 NOTE — Addendum Note (Signed)
Addended by: Ewing Schlein on: 01/06/2016 03:31 PM   Modules accepted: Orders

## 2016-01-06 NOTE — Telephone Encounter (Signed)
Patient has been made aware and verbalized understanding, she had agreed to  Watch her BP and pulse and will follow up prn.   KP

## 2016-01-06 NOTE — Telephone Encounter (Signed)
Recommend to switch to metoprolol 25 mg one tablet twice a day. Watch for low blood pressures or weakness dizziness as it may decrease his pulse and BPs.

## 2016-01-10 ENCOUNTER — Ambulatory Visit (INDEPENDENT_AMBULATORY_CARE_PROVIDER_SITE_OTHER): Payer: Medicare Other | Admitting: Family

## 2016-01-10 ENCOUNTER — Encounter: Payer: Self-pay | Admitting: Family

## 2016-01-10 VITALS — BP 130/84 | HR 60 | Temp 97.7°F | Resp 16 | Ht 69.0 in | Wt 159.8 lb

## 2016-01-10 DIAGNOSIS — R300 Dysuria: Secondary | ICD-10-CM

## 2016-01-10 DIAGNOSIS — R319 Hematuria, unspecified: Secondary | ICD-10-CM | POA: Diagnosis not present

## 2016-01-10 DIAGNOSIS — N390 Urinary tract infection, site not specified: Secondary | ICD-10-CM

## 2016-01-10 LAB — POCT URINALYSIS DIPSTICK
BILIRUBIN UA: NEGATIVE
Glucose, UA: NEGATIVE
Ketones, UA: NEGATIVE
NITRITE UA: POSITIVE
PH UA: 6
Spec Grav, UA: >=1.03
UROBILINOGEN UA: NEGATIVE

## 2016-01-10 MED ORDER — CIPROFLOXACIN HCL 250 MG PO TABS: 250.0000 mg | ORAL_TABLET | Freq: Two times a day (BID) | ORAL | 0 refills | Status: DC

## 2016-01-10 NOTE — Progress Notes (Signed)
Pre visit review using our clinic review tool, if applicable. No additional management support is needed unless otherwise documented below in the visit note. 

## 2016-01-10 NOTE — Patient Instructions (Signed)
Please begin cipro (antibiotic for urinary tract infection).  Call if new/worsening symptoms or if symptoms are not improved in 3 days.

## 2016-01-10 NOTE — Progress Notes (Signed)
Subjective:    Patient ID: Amanda Joyce, female    DOB: 12/18/1940, 75 y.o.   MRN: [id number]  HPI  Amanda Joyce is a 75 yr old female who presents today with chief complaint of dysuria.  She reports that dysuria/cloudy  Urine and frequency x 1 week. Denies fever.  She reports some low back pain yesterday. Reports back pain today is minor.  Energy level is low.    Review of Systems    see HPI  Past Medical History:  Diagnosis Date  . Anxiety   . Arthritis    neck, shoulders, back, knees   . Breast cancer ([place])    Left   . Depression   . GERD (gastroesophageal reflux disease)   . Gout   . Hiatal hernia   . History of stress test >10 yrs. ago   in [place], [place] - no need for follow up  . Hyperlipemia   . Hypertension   . Irritable bowel syndrome   . Nontoxic uninodular goiter   . Osteoarthrosis, unspecified whether generalized or localized, unspecified site   . Personal history of malignant neoplasm of breast   . Temporomandibular joint disorders, unspecified      Social History   Social History  . Marital status: Single    Spouse name: N/A  . Number of children: 2  . Years of education: N/A   Occupational History  . 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 Smoking status: Never Smoker  . Smokeless tobacco: Never Used  . Alcohol use No  . Drug use: No  . Sexual activity: Not on file   Other Topics Concern  . Not on file   Social History Narrative   2 caffeine drinks daily     Past Surgical History:  Procedure Laterality Date  . ABDOMINAL HYSTERECTOMY    . APPENDECTOMY    . BREAST LUMPECTOMY     Left breast X2  . EYE SURGERY Bilateral    /w iol  . HERNIA REPAIR    . THYROID SURGERY    . TOTAL KNEE ARTHROPLASTY Right 08/04/2014   Procedure: RIGHT TOTAL KNEE ARTHROPLASTY;  Surgeon: Kathryne Hitch, MD;  Location: [facility];  Service: Orthopedics;  Laterality: Right;    Family History  Problem Relation Age of Onset  .  Coronary artery disease Father   . Liver cancer Paternal Uncle   . Alcohol abuse Paternal Uncle   . Colon cancer Neg Hx   . Other Mother     deceased from brain tumor    Allergies  Allergen Reactions  . Clarithromycin     REACTION: NIGHTMARES  . Influenza Virus Vaccine Split Nausea And Vomiting  . Penicillins     REACTION: ITCHING  . Soma [Carisoprodol] Other (See Comments)    nightmares    Current Outpatient Prescriptions on File Prior to Visit  Medication Sig Dispense Refill  . azelastine (ASTELIN) 0.1 % nasal spray Place 1 spray into both nostrils 2 (two) times daily. Use in each nostril as directed 30 mL 12  . Azelastine-Fluticasone 137-50 MCG/ACT SUSP Place 1 spray into the nose 2 (two) times daily as needed. 1 Bottle 5  . b complex vitamins capsule Take 1 capsule by mouth daily.    . Biotin 5000 MCG CAPS Take 5,000 mcg by mouth daily.    . Cholecalciferol (VITAMIN D3) 2000 UNITS capsule Take 4,000 Units by mouth daily.    Marland Kitchen  desvenlafaxine (PRISTIQ) 100 MG 24 hr tablet Take 1 tablet (100 mg total) by mouth daily.    Marland Kitchen diltiazem (CARTIA XT) 240 MG 24 hr capsule TAKE 1 BY MOUTH DAILY 90 capsule 1  . ibuprofen (ADVIL,MOTRIN) 800 MG tablet Take 1 tablet (800 mg total) by mouth every 8 (eight) hours as needed. 90 tablet 0  . ipratropium (ATROVENT) 0.06 % nasal spray Place 2 sprays into the nose 3 (three) times daily as needed. 15 mL 5  . levocetirizine (XYZAL) 5 MG tablet Take 1 tablet (5 mg total) by mouth every evening. 30 tablet 5  . metoprolol tartrate (LOPRESSOR) 25 MG tablet Take 1 tablet (25 mg total) by mouth 2 (two) times daily. 180 tablet 0  . oxyCODONE-acetaminophen (ROXICET) 5-325 MG tablet Take 1-2 tablets by mouth every 4 (four) hours as needed. 90 tablet 0  . pravastatin (PRAVACHOL) 20 MG tablet TAKE 1 BY MOUTH DAILY 90 tablet 1  . quinapril (ACCUPRIL) 40 MG tablet TAKE 1 BY MOUTH DAILY 90 tablet 1  . triamcinolone cream (KENALOG) 0.1 % Apply 1 application topically  2 (two) times daily. 30 g 5  . zolpidem (AMBIEN) 5 MG tablet Take 1 tablet (5 mg total) by mouth at bedtime as needed for sleep. 15 tablet 1   No current facility-administered medications on file prior to visit.     BP 130/84   Pulse 60   Temp 97.7 F (36.5 C) (Oral)   Resp 16   Ht 5\' 9"  (1.753 m)   Wt 159 lb 12.8 oz (72.5 kg)   BMI 23.60 kg/m    Objective:   Physical Exam  Constitutional: She is oriented to person, place, and time. She appears well-developed and well-nourished.  Cardiovascular: Normal rate, regular rhythm and normal heart sounds.   No murmur heard. Pulmonary/Chest: Effort normal and breath sounds normal. No respiratory distress. She has no wheezes.  Abdominal: Soft. Bowel sounds are normal. She exhibits no distension. There is no tenderness. There is no guarding and no CVA tenderness.  Neurological: She is alert and oriented to person, place, and time.  Psychiatric: She has a normal mood and affect. Her behavior is normal. Judgment and thought content normal.          Assessment & Plan:  UTI- UA + leuks, will send urine for culture.  Neg CVAT today, however due to c/o back pain, will plan a 7 day course of cipro to cover pyelo.

## 2016-01-12 LAB — URINE CULTURE

## 2016-01-26 ENCOUNTER — Ambulatory Visit
Admission: RE | Admit: 2016-01-26 | Discharge: 2016-01-26 | Disposition: A | Discharge status: Home / Self Care | Payer: Medicare Other | Source: Ambulatory Visit | Attending: Family Medicine | Admitting: Family Medicine

## 2016-01-26 DIAGNOSIS — Z1231 Encounter for screening mammogram for malignant neoplasm of breast: Secondary | ICD-10-CM

## 2016-02-02 ENCOUNTER — Telehealth: Payer: Self-pay | Admitting: Family Medicine

## 2016-02-02 DIAGNOSIS — G894 Chronic pain syndrome: Secondary | ICD-10-CM

## 2016-02-02 MED ORDER — OXYCODONE-ACETAMINOPHEN 5-325 MG PO TABS: 1.0000 | ORAL_TABLET | ORAL | 0 refills | Status: DC | PRN

## 2016-02-02 NOTE — Telephone Encounter (Signed)
Last seen and filled 01/02/16 #90 UDS 04/15/15 low risk   Please advise   KP

## 2016-02-02 NOTE — Telephone Encounter (Signed)
Patient is requesting a refill of  oxyCODONE-acetaminophen (ROXICET) 5-325 MG tablet  Patient Relation: Self Patient phone:[phone]

## 2016-02-02 NOTE — Telephone Encounter (Signed)
Refill z1

## 2016-02-02 NOTE — Telephone Encounter (Signed)
Patient is aware the medication will be ready for pick up in the morning.    KP

## 2016-02-15 ENCOUNTER — Telehealth: Payer: Self-pay | Admitting: Family Medicine

## 2016-02-15 NOTE — Telephone Encounter (Signed)
Relationship to patient: Self Can be reached: [phone]   Reason for call: Patient states she is ready for some more desvenlafaxine (PRISTIQ) 100 MG 24 hr tablet [id number]. Pt has 1-2  Weeks on hand.

## 2016-02-16 ENCOUNTER — Other Ambulatory Visit: Payer: Self-pay

## 2016-02-16 DIAGNOSIS — F411 Generalized anxiety disorder: Secondary | ICD-10-CM

## 2016-02-16 MED ORDER — DESVENLAFAXINE SUCCINATE ER 100 MG PO TB24: 100.0000 mg | ORAL_TABLET | Freq: Every day | ORAL | Status: DC

## 2016-02-16 NOTE — Telephone Encounter (Signed)
Spoke with husband to let patient know that her request for medication refill was sent in to her local pharmacy. Patient's husband had no further questions or concerns at this time.

## 2016-02-23 IMAGING — CR DG KNEE 1-2V PORT*R*
2 series · 2 of 2 positions shown · non-contrast
Comparison: 05/19/2009 radiographs.

CLINICAL DATA: Postop right total knee arthroplasty. Initial
encounter.

EXAM:
PORTABLE RIGHT KNEE - 1-2 VIEW

[AP]
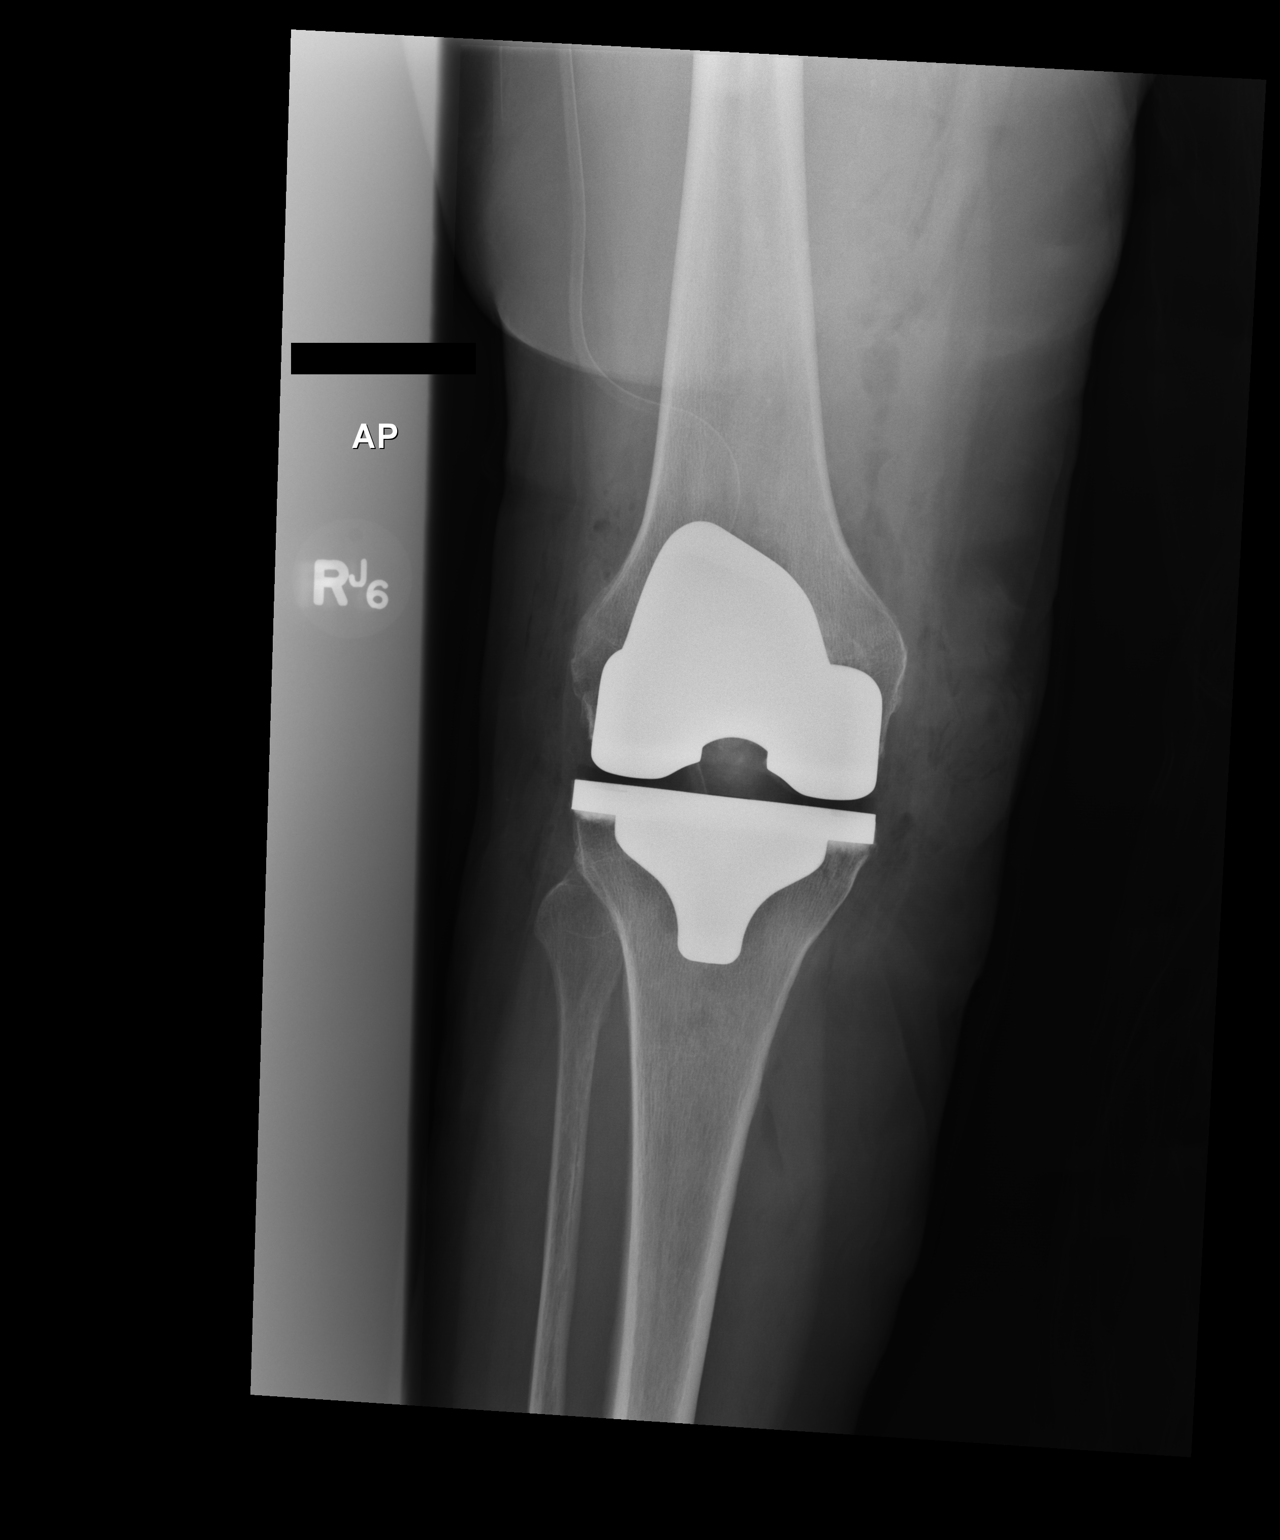

[xtable lateral]
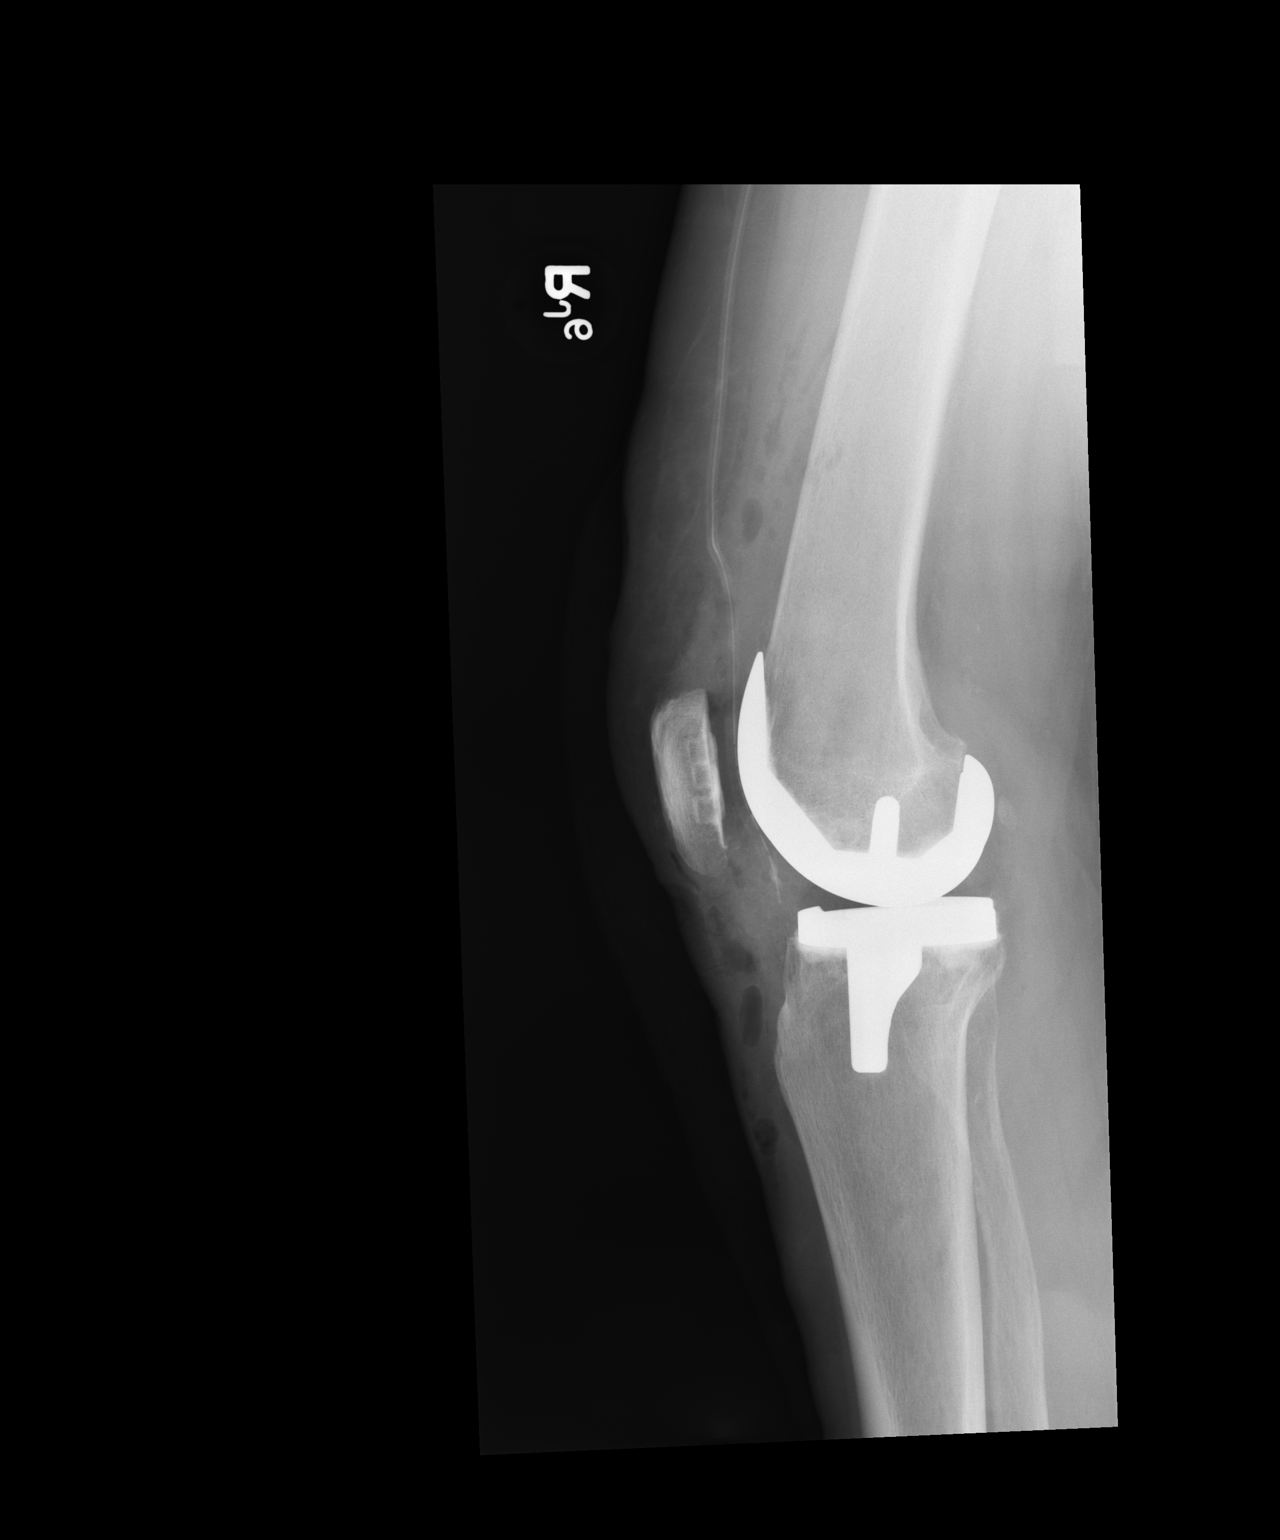

[2 of 2 positions shown; findings below may reference images not displayed]

FINDINGS: The patient has undergone interval right total knee arthroplasty.
The hardware appears well positioned. A surgical drain is in place.
A small amount of air is present within the joint and soft tissues
surrounding the knee. There is no evidence of acute fracture or
subluxation.
IMPRESSION: No demonstrated complication related to right total knee
arthroplasty.

## 2016-03-01 ENCOUNTER — Telehealth: Payer: Self-pay | Admitting: Family Medicine

## 2016-03-01 DIAGNOSIS — G894 Chronic pain syndrome: Secondary | ICD-10-CM

## 2016-03-01 NOTE — Telephone Encounter (Signed)
Refill x1 

## 2016-03-01 NOTE — Telephone Encounter (Signed)
Last fill 02/02/16 #90 0, Last ov 01/02/16. Last UDS 04/14/16. Please advise.

## 2016-03-01 NOTE — Telephone Encounter (Signed)
Patient is requesting a refill of oxyCODONE-acetaminophen (ROXICET) 5-325 MG tablet   Phone: [phone]

## 2016-03-02 MED ORDER — OXYCODONE-ACETAMINOPHEN 5-325 MG PO TABS: 1.0000 | ORAL_TABLET | ORAL | 0 refills | Status: DC | PRN

## 2016-03-02 NOTE — Telephone Encounter (Signed)
Patient aware Rx is ready for pick up.      KP 

## 2016-03-15 ENCOUNTER — Other Ambulatory Visit: Payer: Self-pay

## 2016-03-15 ENCOUNTER — Other Ambulatory Visit: Payer: Self-pay | Admitting: Family Medicine

## 2016-03-15 DIAGNOSIS — F411 Generalized anxiety disorder: Secondary | ICD-10-CM

## 2016-03-15 MED ORDER — DESVENLAFAXINE SUCCINATE ER 100 MG PO TB24: 100.0000 mg | ORAL_TABLET | Freq: Every day | ORAL | Status: DC

## 2016-03-15 NOTE — Telephone Encounter (Signed)
Patient is requesting a refill of desvenlafaxine (PRISTIQ) 100 MG 24 hr tablet. She states the medication comes to our office and she picks it up here. Please advise.   Patient phone: [phone]

## 2016-03-15 NOTE — Telephone Encounter (Signed)
Medication refilled

## 2016-03-16 ENCOUNTER — Telehealth: Payer: Self-pay

## 2016-03-16 ENCOUNTER — Other Ambulatory Visit: Payer: Self-pay | Admitting: Emergency Medicine

## 2016-03-16 MED ORDER — METOPROLOL TARTRATE 25 MG PO TABS: 25.0000 mg | ORAL_TABLET | Freq: Two times a day (BID) | ORAL | 0 refills | Status: DC

## 2016-03-16 NOTE — Telephone Encounter (Signed)
Called patient to notiiy that her Pristiq has been ordered from patient assistance.

## 2016-04-05 ENCOUNTER — Telehealth: Payer: Self-pay | Admitting: Family Medicine

## 2016-04-05 DIAGNOSIS — G894 Chronic pain syndrome: Secondary | ICD-10-CM

## 2016-04-05 MED ORDER — OXYCODONE-ACETAMINOPHEN 5-325 MG PO TABS: 1.0000 | ORAL_TABLET | ORAL | 0 refills | Status: DC | PRN

## 2016-04-05 NOTE — Telephone Encounter (Signed)
Patient is requesting a refill of oxyCODONE-acetaminophen (ROXICET) 5-325 MG tablet Please advise   Patient phone: [phone]

## 2016-04-05 NOTE — Telephone Encounter (Signed)
Last ov 01/10/16. Last fill 03/02/16 #90 0. Please advise.

## 2016-04-05 NOTE — Telephone Encounter (Signed)
I reviewed pt med list and looks like she is due for refill. I printed and signed. I will put it under you keyboard. She can pick up rx tomorrow.

## 2016-04-06 NOTE — Telephone Encounter (Signed)
Patient has been made aware that her Rx is ready for pickup at the front desk.

## 2016-04-20 ENCOUNTER — Other Ambulatory Visit: Payer: Self-pay | Admitting: Family Medicine

## 2016-04-20 DIAGNOSIS — M158 Other polyosteoarthritis: Secondary | ICD-10-CM

## 2016-05-04 ENCOUNTER — Telehealth: Payer: Self-pay | Admitting: Family Medicine

## 2016-05-04 NOTE — Telephone Encounter (Signed)
Patient is calling to request a refill of oxyCODONE-acetaminophen (ROXICET) 5-325 MG tablet Please advise  Phone: [phone]

## 2016-05-08 ENCOUNTER — Other Ambulatory Visit: Payer: Self-pay | Admitting: Family Medicine

## 2016-05-08 DIAGNOSIS — G894 Chronic pain syndrome: Secondary | ICD-10-CM

## 2016-05-08 MED ORDER — OXYCODONE-ACETAMINOPHEN 5-325 MG PO TABS: 1.0000 | ORAL_TABLET | ORAL | 0 refills | Status: DC | PRN

## 2016-05-08 NOTE — Telephone Encounter (Signed)
Refill x1 

## 2016-05-08 NOTE — Telephone Encounter (Signed)
Last ov 01/10/16. Last fill 04/05/16 #90 0. Please advise. LB

## 2016-05-08 NOTE — Telephone Encounter (Signed)
Patient calling back to follow up on request for refill. Plse adv

## 2016-05-09 NOTE — Telephone Encounter (Signed)
Called pt to inform her Rx is in front ready for pick up. UDS is needed at time of pick up. LB

## 2016-05-10 NOTE — Telephone Encounter (Signed)
Patient left a message she was upset that her son was not able to pick up her rx because she was due for a UDS. She says will come in tomorrow to do it but she hopes we find another way to do this because she sends her son out here to pick it up and she does not know when we will screen her.

## 2016-05-14 ENCOUNTER — Encounter: Payer: Self-pay | Admitting: Family Medicine

## 2016-05-14 DIAGNOSIS — Z79899 Other long term (current) drug therapy: Secondary | ICD-10-CM | POA: Diagnosis not present

## 2016-05-14 DIAGNOSIS — Z79891 Long term (current) use of opiate analgesic: Secondary | ICD-10-CM | POA: Diagnosis not present

## 2016-05-18 ENCOUNTER — Ambulatory Visit: Payer: Medicare Other | Admitting: Gastroenterology

## 2016-05-24 ENCOUNTER — Ambulatory Visit: Payer: Medicare Other | Admitting: Family Medicine

## 2016-06-07 ENCOUNTER — Telehealth: Payer: Self-pay | Admitting: Family Medicine

## 2016-06-07 DIAGNOSIS — G894 Chronic pain syndrome: Secondary | ICD-10-CM

## 2016-06-07 NOTE — Telephone Encounter (Signed)
Patient is requesting a refill of oxyCODONE-acetaminophen (ROXICET) 5-325 MG tablet Please advise  Phone: [phone]

## 2016-06-07 NOTE — Telephone Encounter (Signed)
Requesting:  Oxycodone Contract   09/28/2015 UDS   Done on 05/10/2016, but not enough--previous low risk Last OV   01/02/2016 Last Refill    #90 with 0 refills on 05/08/2016  Please Advise

## 2016-06-08 MED ORDER — OXYCODONE-ACETAMINOPHEN 5-325 MG PO TABS: 1.0000 | ORAL_TABLET | ORAL | 0 refills | Status: DC | PRN

## 2016-06-08 NOTE — Telephone Encounter (Signed)
Ok to refill x1 Indication for chronic opioid: osteoarthritis Medication and dose: oxycodone 5/325 #90 # pills per month: 90 Last UDS date: 05/10/16 but not enough urine Pain contract signed (Y/N): 09/28/2015 Date narcotic database last reviewed (include red flags): 06/08/2016

## 2016-06-08 NOTE — Telephone Encounter (Signed)
Printed/on counter for signature. Called the patient informed her son prescription is ready for pickup at the front desk.

## 2016-06-11 ENCOUNTER — Other Ambulatory Visit: Payer: Self-pay | Admitting: Family Medicine

## 2016-06-11 DIAGNOSIS — I10 Essential (primary) hypertension: Secondary | ICD-10-CM

## 2016-06-11 DIAGNOSIS — E785 Hyperlipidemia, unspecified: Secondary | ICD-10-CM

## 2016-06-11 MED ORDER — METOPROLOL TARTRATE 25 MG PO TABS: 25.0000 mg | ORAL_TABLET | Freq: Two times a day (BID) | ORAL | 0 refills | Status: DC

## 2016-06-11 MED ORDER — DILTIAZEM HCL ER COATED BEADS 240 MG PO CP24
ORAL_CAPSULE | ORAL | 1 refills | Status: DC
Start: 2016-06-11 — End: 2016-12-20

## 2016-06-11 MED ORDER — PRAVASTATIN SODIUM 20 MG PO TABS: ORAL_TABLET | ORAL | 1 refills | Status: DC

## 2016-06-11 MED ORDER — QUINAPRIL HCL 40 MG PO TABS: ORAL_TABLET | ORAL | 1 refills | Status: DC

## 2016-06-28 ENCOUNTER — Encounter: Payer: Self-pay | Admitting: Family Medicine

## 2016-06-28 ENCOUNTER — Ambulatory Visit (INDEPENDENT_AMBULATORY_CARE_PROVIDER_SITE_OTHER): Payer: Medicare Other | Admitting: Family Medicine

## 2016-06-28 VITALS — BP 120/82 | HR 79 | Temp 98.0°F | Resp 16 | Ht 69.0 in | Wt 162.2 lb

## 2016-06-28 DIAGNOSIS — R1011 Right upper quadrant pain: Secondary | ICD-10-CM

## 2016-06-28 DIAGNOSIS — F411 Generalized anxiety disorder: Secondary | ICD-10-CM

## 2016-06-28 MED ORDER — ALPRAZOLAM 0.5 MG PO TABS: 0.5000 mg | ORAL_TABLET | Freq: Three times a day (TID) | ORAL | 0 refills | Status: DC | PRN

## 2016-06-28 MED ORDER — OMEPRAZOLE 40 MG PO CPDR: 40.0000 mg | DELAYED_RELEASE_CAPSULE | Freq: Every day | ORAL | 2 refills | Status: DC

## 2016-06-28 NOTE — Patient Instructions (Signed)

## 2016-06-28 NOTE — Progress Notes (Signed)
Pre visit review using our clinic review tool, if applicable. No additional management support is needed unless otherwise documented below in the visit note. 

## 2016-06-28 NOTE — Progress Notes (Signed)
Patient ID: Amanda Joyce, female   DOB: 1940/10/14, 76 y.o.   MRN: [id number]   I acted as a Education administrator for Dr. Carollee Herter.  [name], CMA    Subjective:    Patient ID: Amanda Joyce, female    DOB: 1940/09/15, 76 y.o.   MRN: [id number]  Chief Complaint  Patient presents with  . Abdominal Pain    RUQ pain since last year    Abdominal Pain  This is a chronic problem. The current episode started more than 1 year ago. The problem occurs intermittently. The pain is located in the RUQ. The pain is moderate. The abdominal pain radiates to the LUQ. Pertinent negatives include no constipation, diarrhea, dysuria, fever, frequency, headaches, hematuria, myalgias, nausea or vomiting. Treatments tried: pepto bismol. Prior workup: rib xray done 8/17.      Past Medical History:  Diagnosis Date  . Anxiety   . Arthritis    neck, shoulders, back, knees   . Breast cancer ([place])    Left   . Depression   . GERD (gastroesophageal reflux disease)   . Gout   . Hiatal hernia   . History of stress test >10 yrs. ago   in [place], [place] - no need for follow up  . Hyperlipemia   . Hypertension   . Irritable bowel syndrome   . Nontoxic uninodular goiter   . Osteoarthrosis, unspecified whether generalized or localized, unspecified site   . Personal history of malignant neoplasm of breast   . Temporomandibular joint disorders, unspecified     Past Surgical History:  Procedure Laterality Date  . ABDOMINAL HYSTERECTOMY    . APPENDECTOMY    . BREAST LUMPECTOMY     Left breast X2  . EYE SURGERY Bilateral    /w iol  . HERNIA REPAIR    . THYROID SURGERY    . TOTAL KNEE ARTHROPLASTY Right 08/04/2014   Procedure: RIGHT TOTAL KNEE ARTHROPLASTY;  Surgeon: Kathryne Hitch, MD;  Location: [facility];  Service: Orthopedics;  Laterality: Right;    Family History  Problem Relation Age of Onset  . Coronary artery disease Father   . Liver cancer Paternal Uncle   . Alcohol abuse Paternal Uncle   . Colon cancer Neg Hx     . Other Mother     deceased from brain tumor    Social History   Social History  . Marital status: Single    Spouse name: N/A  . Number of children: 2  . Years of education: N/A   Occupational History  . 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 Smoking status: Never Smoker  . Smokeless tobacco: Never Used  . Alcohol use No  . Drug use: No  . Sexual activity: Not on file   Other Topics Concern  . Not on file   Social History Narrative   2 caffeine drinks daily     Outpatient Medications Prior to Visit  Medication Sig Dispense Refill  . azelastine (ASTELIN) 0.1 % nasal spray Place 1 spray into both nostrils 2 (two) times daily. Use in each nostril as directed 30 mL 12  . Azelastine-Fluticasone 137-50 MCG/ACT SUSP Place 1 spray into the nose 2 (two) times daily as needed. 1 Bottle 5  . b complex vitamins capsule Take 1 capsule by mouth daily.    . Biotin 5000 MCG CAPS Take 5,000 mcg by mouth daily.    . Cholecalciferol (VITAMIN D3)  2000 UNITS capsule Take 4,000 Units by mouth daily.    . ciprofloxacin (CIPRO) 250 MG tablet Take 1 tablet (250 mg total) by mouth 2 (two) times daily. 14 tablet 0  . desvenlafaxine (PRISTIQ) 100 MG 24 hr tablet Take 1 tablet (100 mg total) by mouth daily.    Marland Kitchen diltiazem (CARTIA XT) 240 MG 24 hr capsule TAKE 1 BY MOUTH DAILY 90 capsule 1  . ibuprofen (ADVIL,MOTRIN) 800 MG tablet TAKE ONE TABLET BY MOUTH EVERY 8 HOURS AS NEEDED 90 tablet 0  . ipratropium (ATROVENT) 0.06 % nasal spray Place 2 sprays into the nose 3 (three) times daily as needed. 15 mL 5  . levocetirizine (XYZAL) 5 MG tablet Take 1 tablet (5 mg total) by mouth every evening. 30 tablet 5  . metoprolol tartrate (LOPRESSOR) 25 MG tablet Take 1 tablet (25 mg total) by mouth 2 (two) times daily. 180 tablet 0  . oxyCODONE-acetaminophen (ROXICET) 5-325 MG tablet Take 1-2 tablets by mouth every 4 (four) hours as needed. 90 tablet 0  . pravastatin  (PRAVACHOL) 20 MG tablet TAKE 1 BY MOUTH DAILY 90 tablet 1  . quinapril (ACCUPRIL) 40 MG tablet TAKE 1 BY MOUTH DAILY 90 tablet 1  . triamcinolone cream (KENALOG) 0.1 % Apply 1 application topically 2 (two) times daily. 30 g 5  . zolpidem (AMBIEN) 5 MG tablet Take 1 tablet (5 mg total) by mouth at bedtime as needed for sleep. 15 tablet 1   No facility-administered medications prior to visit.     Allergies  Allergen Reactions  . Clarithromycin     REACTION: NIGHTMARES  . Influenza Virus Vaccine Split Nausea And Vomiting  . Penicillins     REACTION: ITCHING  . Soma [Carisoprodol] Other (See Comments)    nightmares    Review of Systems  Constitutional: Negative for chills, fever and malaise/fatigue.  HENT: Negative for congestion and hearing loss.   Eyes: Negative for discharge.  Respiratory: Negative for cough, sputum production and shortness of breath.   Cardiovascular: Negative for chest pain, palpitations and leg swelling.  Gastrointestinal: Positive for abdominal pain. Negative for blood in stool, constipation, diarrhea, heartburn, nausea and vomiting.  Genitourinary: Negative for dysuria, frequency, hematuria and urgency.  Musculoskeletal: Negative for back pain, falls and myalgias.  Skin: Negative for rash.  Neurological: Negative for dizziness, sensory change, loss of consciousness, weakness and headaches.  Endo/Heme/Allergies: Negative for environmental allergies. Does not bruise/bleed easily.  Psychiatric/Behavioral: Negative for depression and suicidal ideas. The patient is not nervous/anxious and does not have insomnia.        Objective:    Physical Exam  Constitutional: She is oriented to person, place, and time. She appears well-developed and well-nourished.  HENT:  Head: Normocephalic and atraumatic.  Eyes: Conjunctivae and EOM are normal.  Neck: Normal range of motion. Neck supple. No JVD present. Carotid bruit is not present. No thyromegaly present.    Cardiovascular: Normal rate, regular rhythm and normal heart sounds.   No murmur heard. Pulmonary/Chest: Effort normal and breath sounds normal. No respiratory distress. She has no wheezes. She has no rales. She exhibits no tenderness.  Abdominal: Soft. Bowel sounds are normal. There is tenderness. There is guarding. There is no rebound.  Musculoskeletal: She exhibits no edema.  Neurological: She is alert and oriented to person, place, and time.  Psychiatric: She has a normal mood and affect.  Nursing note and vitals reviewed.   BP 120/82 (BP Location: Right Arm, Patient Position: Sitting, Cuff Size: Normal)  Pulse 79   Temp 98 F (36.7 C) (Oral)   Resp 16   Ht '5\' 9"'  (1.753 m)   Wt 162 lb 3.2 oz (73.6 kg)   SpO2 96%   BMI 23.95 kg/m  Wt Readings from Last 3 Encounters:  06/28/16 162 lb 3.2 oz (73.6 kg)  01/10/16 159 lb 12.8 oz (72.5 kg)  01/02/16 158 lb 12.8 oz (72 kg)     Lab Results  Component Value Date   WBC 7.6 06/28/2016   HGB 14.6 06/28/2016   HCT 42.9 06/28/2016   PLT 360.0 06/28/2016   GLUCOSE 92 06/28/2016   CHOL 177 01/02/2016   TRIG 228.0 (H) 01/02/2016   HDL 41.30 01/02/2016   LDLDIRECT 106.0 01/02/2016   LDLCALC 94 11/17/2014   ALT 11 06/28/2016   AST 18 06/28/2016   NA 140 06/28/2016   K 4.8 06/28/2016   CL 102 06/28/2016   CREATININE 0.75 06/28/2016   BUN 18 06/28/2016   CO2 33 (H) 06/28/2016   TSH 2.51 11/17/2014   INR 1.04 07/23/2014   HGBA1C 6.1 07/03/2011    Lab Results  Component Value Date   TSH 2.51 11/17/2014   Lab Results  Component Value Date   WBC 7.6 06/28/2016   HGB 14.6 06/28/2016   HCT 42.9 06/28/2016   MCV 89.0 06/28/2016   PLT 360.0 06/28/2016   Lab Results  Component Value Date   NA 140 06/28/2016   K 4.8 06/28/2016   CHLORIDE 105 01/18/2015   CO2 33 (H) 06/28/2016   GLUCOSE 92 06/28/2016   BUN 18 06/28/2016   CREATININE 0.75 06/28/2016   BILITOT 0.6 06/28/2016   ALKPHOS 59 06/28/2016   AST 18 06/28/2016    ALT 11 06/28/2016   PROT 7.4 06/28/2016   ALBUMIN 4.7 06/28/2016   CALCIUM 10.5 06/28/2016   ANIONGAP 7 01/18/2015   EGFR 63 (L) 01/18/2015   GFR 79.87 06/28/2016   Lab Results  Component Value Date   CHOL 177 01/02/2016   Lab Results  Component Value Date   HDL 41.30 01/02/2016   Lab Results  Component Value Date   LDLCALC 94 11/17/2014   Lab Results  Component Value Date   TRIG 228.0 (H) 01/02/2016   Lab Results  Component Value Date   CHOLHDL 4 01/02/2016   Lab Results  Component Value Date   HGBA1C 6.1 07/03/2011       Assessment & Plan:   Problem List Items Addressed This Visit      Unprioritized   Anxiety state   Relevant Medications   ALPRAZolam (XANAX) 0.5 MG tablet    Other Visit Diagnoses    RUQ abdominal pain    -  Primary   Relevant Medications   omeprazole (PRILOSEC) 40 MG capsule   Other Relevant Orders   US Abdomen Complete (Completed)   CBC with Differential/Platelet (Completed)   H. pylori antibody, IgG (Completed)   Comprehensive metabolic panel (Completed)   Amylase (Completed)   Lipase (Completed)      I have changed Ms. Spiewak ALPRAZolam. I am also having her start on omeprazole. Additionally, I am having her maintain her Vitamin D3, b complex vitamins, Biotin, triamcinolone cream, ipratropium, levocetirizine, Azelastine-Fluticasone, azelastine, zolpidem, ciprofloxacin, desvenlafaxine, ibuprofen, oxyCODONE-acetaminophen, diltiazem, metoprolol tartrate, pravastatin, quinapril, and Diclofenac Sodium.  Meds ordered this encounter  Medications  . Diclofenac Sodium (PENNSAID) 2 % SOLN    Sig: Place onto the skin. Apply to knee daily as needed  . DISCONTD: ALPRAZolam Duanne Moron) 0.5  MG tablet    Sig: Take 0.5 mg by mouth 3 (three) times daily as needed for anxiety or sleep.  Marland Kitchen omeprazole (PRILOSEC) 40 MG capsule    Sig: Take 1 capsule (40 mg total) by mouth daily.    Dispense:  30 capsule    Refill:  2  . ALPRAZolam (XANAX) 0.5 MG  tablet    Sig: Take 1 tablet (0.5 mg total) by mouth 3 (three) times daily as needed for anxiety or sleep.    Dispense:  30 tablet    Refill:  0    CMA served as scribe during this visit. History, Physical and Plan performed by medical provider. Documentation and orders reviewed and attested to.  Ann Held, DO

## 2016-06-29 ENCOUNTER — Ambulatory Visit (HOSPITAL_BASED_OUTPATIENT_CLINIC_OR_DEPARTMENT_OTHER)
Admission: RE | Admit: 2016-06-29 | Discharge: 2016-06-29 | Disposition: A | Discharge status: Home / Self Care | Payer: Medicare Other | Source: Ambulatory Visit | Attending: Family Medicine | Admitting: Family Medicine

## 2016-06-29 ENCOUNTER — Telehealth: Payer: Self-pay | Admitting: Family Medicine

## 2016-06-29 DIAGNOSIS — I77811 Abdominal aortic ectasia: Secondary | ICD-10-CM | POA: Insufficient documentation

## 2016-06-29 DIAGNOSIS — R1011 Right upper quadrant pain: Secondary | ICD-10-CM | POA: Diagnosis not present

## 2016-06-29 DIAGNOSIS — N200 Calculus of kidney: Secondary | ICD-10-CM | POA: Insufficient documentation

## 2016-06-29 LAB — CBC WITH DIFFERENTIAL/PLATELET
BASOS ABS: 0.1 10*3/uL (ref 0.0–0.1)
BASOS PCT: 1.4 % (ref 0.0–3.0)
EOS ABS: 0.2 10*3/uL (ref 0.0–0.7)
EOS PCT: 2.7 % (ref 0.0–5.0)
HEMATOCRIT: 42.9 % (ref 36.0–46.0)
HEMOGLOBIN: 14.6 g/dL (ref 12.0–15.0)
Lymphocytes Relative: 28.8 % (ref 12.0–46.0)
Lymphs Abs: 2.2 10*3/uL (ref 0.7–4.0)
MCHC: 33.9 g/dL (ref 30.0–36.0)
MCV: 89 fl (ref 78.0–100.0)
MONO ABS: 0.7 10*3/uL (ref 0.1–1.0)
Monocytes Relative: 9.1 % (ref 3.0–12.0)
Neutro Abs: 4.4 10*3/uL (ref 1.4–7.7)
Neutrophils Relative %: 58 % (ref 43.0–77.0)
Platelets: 360 10*3/uL (ref 150.0–400.0)
RBC: 4.82 Mil/uL (ref 3.87–5.11)
RDW: 12.8 % (ref 11.5–15.5)
WBC: 7.6 10*3/uL (ref 4.0–10.5)

## 2016-06-29 LAB — AMYLASE: AMYLASE: 56 U/L (ref 27–131)

## 2016-06-29 LAB — COMPREHENSIVE METABOLIC PANEL
ALBUMIN: 4.7 g/dL (ref 3.5–5.2)
ALK PHOS: 59 U/L (ref 39–117)
ALT: 11 U/L (ref 0–35)
AST: 18 U/L (ref 0–37)
BILIRUBIN TOTAL: 0.6 mg/dL (ref 0.2–1.2)
BUN: 18 mg/dL (ref 6–23)
CALCIUM: 10.5 mg/dL (ref 8.4–10.5)
CHLORIDE: 102 meq/L (ref 96–112)
CO2: 33 mEq/L — ABNORMAL HIGH (ref 19–32)
CREATININE: 0.75 mg/dL (ref 0.40–1.20)
GFR: 79.87 mL/min (ref >60.00)
Glucose, Bld: 92 mg/dL (ref 70–99)
Potassium: 4.8 mEq/L (ref 3.5–5.1)
SODIUM: 140 meq/L (ref 135–145)
TOTAL PROTEIN: 7.4 g/dL (ref 6.0–8.3)

## 2016-06-29 LAB — LIPASE: Lipase: 56 U/L (ref 11.0–59.0)

## 2016-06-29 LAB — H. PYLORI ANTIBODY, IGG: H Pylori IgG: NEGATIVE

## 2016-06-29 NOTE — Telephone Encounter (Signed)
Relation to [id number] Call back number:[phone]   Reason for call:  Patient inquiring about imagine results

## 2016-07-02 NOTE — Telephone Encounter (Signed)
Patient informed of results.  

## 2016-07-11 ENCOUNTER — Other Ambulatory Visit: Payer: Self-pay | Admitting: Family Medicine

## 2016-07-11 DIAGNOSIS — G894 Chronic pain syndrome: Secondary | ICD-10-CM

## 2016-07-11 NOTE — Telephone Encounter (Signed)
Oxycodone Last filled 06/08/16 #90 Last ov 06/28/16 Next ov due around 12/26/16 Contract signed 09/28/15 Last UDS given 05/10/16 not scanned in system

## 2016-07-11 NOTE — Telephone Encounter (Signed)
Caller name: Relationship to patient: Self Can be reached: [phone]  Pharmacy:  Reason for call: Refill oxyCODONE-acetaminophen (ROXICET) 5-325 MG tablet [[id number]

## 2016-07-12 NOTE — Telephone Encounter (Signed)
Refill x1 

## 2016-07-13 MED ORDER — OXYCODONE-ACETAMINOPHEN 5-325 MG PO TABS: 1.0000 | ORAL_TABLET | ORAL | 0 refills | Status: DC | PRN

## 2016-07-13 NOTE — Addendum Note (Signed)
Addended by: Sharon Seller B on: 07/13/2016 11:36 AM   Modules accepted: Orders

## 2016-07-13 NOTE — Telephone Encounter (Signed)
Patient called to check the status of refill request.

## 2016-07-13 NOTE — Telephone Encounter (Signed)
Printed hardcopy/PCP signed and patient informed hardcopy is ready for pickup at the front desk.

## 2016-07-26 ENCOUNTER — Encounter: Payer: Self-pay | Admitting: Family Medicine

## 2016-07-26 ENCOUNTER — Telehealth: Payer: Self-pay | Admitting: Family Medicine

## 2016-07-26 ENCOUNTER — Ambulatory Visit (INDEPENDENT_AMBULATORY_CARE_PROVIDER_SITE_OTHER): Payer: Medicare Other | Admitting: Family Medicine

## 2016-07-26 VITALS — BP 142/78 | HR 81 | Temp 98.3°F | Resp 16 | Ht 69.0 in | Wt 162.6 lb

## 2016-07-26 DIAGNOSIS — G894 Chronic pain syndrome: Secondary | ICD-10-CM

## 2016-07-26 DIAGNOSIS — J069 Acute upper respiratory infection, unspecified: Secondary | ICD-10-CM

## 2016-07-26 DIAGNOSIS — Z9109 Other allergy status, other than to drugs and biological substances: Secondary | ICD-10-CM | POA: Diagnosis not present

## 2016-07-26 DIAGNOSIS — Z23 Encounter for immunization: Secondary | ICD-10-CM

## 2016-07-26 DIAGNOSIS — J302 Other seasonal allergic rhinitis: Secondary | ICD-10-CM

## 2016-07-26 DIAGNOSIS — H9201 Otalgia, right ear: Secondary | ICD-10-CM | POA: Diagnosis not present

## 2016-07-26 MED ORDER — FLUTICASONE PROPIONATE 50 MCG/ACT NA SUSP: 1.0000 | Freq: Two times a day (BID) | NASAL | 11 refills | Status: DC

## 2016-07-26 MED ORDER — AZELASTINE-FLUTICASONE 137-50 MCG/ACT NA SUSP: 1.0000 | Freq: Two times a day (BID) | NASAL | 5 refills | Status: DC | PRN

## 2016-07-26 MED ORDER — LEVOCETIRIZINE DIHYDROCHLORIDE 5 MG PO TABS: 5.0000 mg | ORAL_TABLET | Freq: Every evening | ORAL | 5 refills | Status: AC

## 2016-07-26 MED ORDER — OXYCODONE-ACETAMINOPHEN 5-325 MG PO TABS: 1.0000 | ORAL_TABLET | ORAL | 0 refills | Status: DC | PRN

## 2016-07-26 MED ORDER — ZOSTER VAC RECOMB ADJUVANTED 50 MCG/0.5ML IM SUSR: 0.5000 mL | Freq: Once | INTRAMUSCULAR | 1 refills | Status: AC

## 2016-07-26 MED ORDER — AZELASTINE HCL 0.1 % NA SOLN: 1.0000 | Freq: Two times a day (BID) | NASAL | 12 refills | Status: AC

## 2016-07-26 NOTE — Telephone Encounter (Signed)
flonase 2 sprays each nostril qd  and astelin 1 spray each nostril bid

## 2016-07-26 NOTE — Progress Notes (Signed)
Pre visit review using our clinic review tool, if applicable. No additional management support is needed unless otherwise documented below in the visit note. 

## 2016-07-26 NOTE — Patient Instructions (Signed)
Earache, Adult An earache, or ear pain, can be caused by many things, including:  An infection.  Ear wax buildup.  Ear pressure.  Something in the ear that should not be there (foreign body).  A sore throat.  Tooth problems.  Jaw problems. Treatment of the earache will depend on the cause. If the cause is not clear or cannot be determined, you may need to watch your symptoms until your earache goes away or until a cause is found. Follow these instructions at home: Pay attention to any changes in your symptoms. Take these actions to help with your pain:  Take or apply over-the-counter and prescription medicines only as told by your health care provider.  If you were prescribed an antibiotic medicine, use it as told by your health care provider. Do not stop using the antibiotic even if you start to feel better.  Do not put anything in your ear other than medicine that is prescribed by your health care provider.  If directed, apply heat to the affected area as often as told by your health care provider. Use the heat source that your health care provider recommends, such as a moist heat pack or a heating pad.  Place a towel between your skin and the heat source.  Leave the heat on for 20-30 minutes.  Remove the heat if your skin turns bright red. This is especially important if you are unable to feel pain, heat, or cold. You may have a greater risk of getting burned.  If directed, put ice on the ear:  Put ice in a plastic bag.  Place a towel between your skin and the bag.  Leave the ice on for 20 minutes, 2-3 times a day.  Try resting in an upright position instead of lying down. This may help to reduce pressure in your ear and relieve pain.  Chew gum if it helps to relieve your ear pain.  Treat any allergies as told by your health care provider.  Keep all follow-up visits as told by your health care provider. This is important. Contact a health care provider if:  Your  pain does not improve within 2 days.  Your earache gets worse.  You have new symptoms.  You have a fever. Get help right away if:  You have a severe headache.  You have a stiff neck.  You have trouble swallowing.  You have redness or swelling behind your ear.  You have fluid or blood coming from your ear.  You have hearing loss.  You feel dizzy. This information is not intended to replace advice given to you by your health care provider. Make sure you discuss any questions you have with your health care provider. Document Released: 12/09/2003 Document Revised: 12/20/2015 Document Reviewed: 10/17/2015 Elsevier Interactive Patient Education  2017 [employer]  

## 2016-07-26 NOTE — Telephone Encounter (Signed)
Alternatives sent in.

## 2016-07-26 NOTE — Telephone Encounter (Signed)
Dymista not covered

## 2016-07-26 NOTE — Progress Notes (Signed)
Subjective:  I acted as a Education administrator for Dr. Royden Purl, LPN    Patient ID: Amanda Joyce, female    DOB: 08-15-1940, 76 y.o.   MRN: [id number]  Chief Complaint  Patient presents with  . Ear Pain    Otalgia   This is a recurrent problem. The current episode started more than 1 year ago. Pertinent negatives include no coughing, headaches, rash or vomiting.    Patient is in today for c/o right ear pain for about 20 years but just recently a couple weeks it begin to bother her again. Patient also report she sleeps on her right side and lately it seem when she sleep on the right she feels something is not right. She feels like it wants to pop.  No otc meds.    Patient Care Team: Ann Held, DO as PCP - [place], MD as Referring Physician (Infectious Diseases) Elsie Stain, MD as Consulting Physician (Pulmonary Disease)   Past Medical History:  Diagnosis Date  . Anxiety   . Arthritis    neck, shoulders, back, knees   . Breast cancer ([place])    Left   . Depression   . GERD (gastroesophageal reflux disease)   . Gout   . Hiatal hernia   . History of stress test >10 yrs. ago   in [place], [place] - no need for follow up  . Hyperlipemia   . Hypertension   . Irritable bowel syndrome   . Nontoxic uninodular goiter   . Osteoarthrosis, unspecified whether generalized or localized, unspecified site   . Personal history of malignant neoplasm of breast   . Temporomandibular joint disorders, unspecified     Past Surgical History:  Procedure Laterality Date  . ABDOMINAL HYSTERECTOMY    . APPENDECTOMY    . BREAST LUMPECTOMY     Left breast X2  . EYE SURGERY Bilateral    /w iol  . HERNIA REPAIR    . THYROID SURGERY    . TOTAL KNEE ARTHROPLASTY Right 08/04/2014   Procedure: RIGHT TOTAL KNEE ARTHROPLASTY;  Surgeon: Kathryne Hitch, MD;  Location: [facility];  Service: Orthopedics;  Laterality: Right;    Family History  Problem Relation Age of Onset  . Coronary  artery disease Father   . Liver cancer Paternal Uncle   . Alcohol abuse Paternal Uncle   . Colon cancer Neg Hx   . Other Mother     deceased from brain tumor    Social History   Social History  . Marital status: Single    Spouse name: N/A  . Number of children: 2  . Years of education: N/A   Occupational History  . 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 Smoking status: Never Smoker  . Smokeless tobacco: Never Used  . Alcohol use No  . Drug use: No  . Sexual activity: Not on file   Other Topics Concern  . Not on file   Social History Narrative   2 caffeine drinks daily     Outpatient Medications Prior to Visit  Medication Sig Dispense Refill  . ALPRAZolam (XANAX) 0.5 MG tablet Take 1 tablet (0.5 mg total) by mouth 3 (three) times daily as needed for anxiety or sleep. 30 tablet 0  . b complex vitamins capsule Take 1 capsule by mouth daily.    . Biotin 5000 MCG CAPS Take 5,000 mcg by mouth  daily.    . Cholecalciferol (VITAMIN D3) 2000 UNITS capsule Take 4,000 Units by mouth daily.    Marland Kitchen desvenlafaxine (PRISTIQ) 100 MG 24 hr tablet Take 1 tablet (100 mg total) by mouth daily.    . Diclofenac Sodium (PENNSAID) 2 % SOLN Place onto the skin. Apply to knee daily as needed    . diltiazem (CARTIA XT) 240 MG 24 hr capsule TAKE 1 BY MOUTH DAILY 90 capsule 1  . ibuprofen (ADVIL,MOTRIN) 800 MG tablet TAKE ONE TABLET BY MOUTH EVERY 8 HOURS AS NEEDED 90 tablet 0  . ipratropium (ATROVENT) 0.06 % nasal spray Place 2 sprays into the nose 3 (three) times daily as needed. 15 mL 5  . metoprolol tartrate (LOPRESSOR) 25 MG tablet Take 1 tablet (25 mg total) by mouth 2 (two) times daily. 180 tablet 0  . omeprazole (PRILOSEC) 40 MG capsule Take 1 capsule (40 mg total) by mouth daily. 30 capsule 2  . pravastatin (PRAVACHOL) 20 MG tablet TAKE 1 BY MOUTH DAILY 90 tablet 1  . quinapril (ACCUPRIL) 40 MG tablet TAKE 1 BY MOUTH DAILY 90 tablet 1  . triamcinolone  cream (KENALOG) 0.1 % Apply 1 application topically 2 (two) times daily. 30 g 5  . zolpidem (AMBIEN) 5 MG tablet Take 1 tablet (5 mg total) by mouth at bedtime as needed for sleep. 15 tablet 1  . azelastine (ASTELIN) 0.1 % nasal spray Place 1 spray into both nostrils 2 (two) times daily. Use in each nostril as directed 30 mL 12  . Azelastine-Fluticasone 137-50 MCG/ACT SUSP Place 1 spray into the nose 2 (two) times daily as needed. 1 Bottle 5  . ciprofloxacin (CIPRO) 250 MG tablet Take 1 tablet (250 mg total) by mouth 2 (two) times daily. 14 tablet 0  . levocetirizine (XYZAL) 5 MG tablet Take 1 tablet (5 mg total) by mouth every evening. 30 tablet 5  . oxyCODONE-acetaminophen (ROXICET) 5-325 MG tablet Take 1-2 tablets by mouth every 4 (four) hours as needed. 90 tablet 0   No facility-administered medications prior to visit.     Allergies  Allergen Reactions  . Clarithromycin     REACTION: NIGHTMARES  . Influenza Virus Vaccine Split Nausea And Vomiting  . Penicillins     REACTION: ITCHING  . Soma [Carisoprodol] Other (See Comments)    nightmares    Review of Systems  Constitutional: Negative for fever.  HENT: Positive for ear pain. Negative for congestion.   Eyes: Negative for blurred vision.  Respiratory: Negative for cough.   Cardiovascular: Negative for chest pain and palpitations.  Gastrointestinal: Negative for vomiting.  Musculoskeletal: Negative for back pain.  Skin: Negative for rash.  Neurological: Negative for loss of consciousness and headaches.       Objective:    Physical Exam  Constitutional: She is oriented to person, place, and time. She appears well-developed and well-nourished. No distress.  HENT:  Head: Normocephalic and atraumatic.  Eyes: Conjunctivae are normal. Pupils are equal, round, and reactive to light.  Neck: Normal range of motion. No thyromegaly present.  Cardiovascular: Normal rate and regular rhythm.   Pulmonary/Chest: Effort normal and breath  sounds normal. She has no wheezes.  Abdominal: Soft. Bowel sounds are normal. There is no tenderness.  Musculoskeletal: Normal range of motion. She exhibits no edema or deformity.  Neurological: She is alert and oriented to person, place, and time.  Skin: Skin is warm and dry. She is not diaphoretic.  Psychiatric: She has a normal mood and affect.  BP (!) 142/78 (BP Location: Right Arm, Patient Position: Sitting, Cuff Size: Normal)   Pulse 81   Temp 98.3 F (36.8 C) (Oral)   Resp 16   Ht 5' 9" (1.753 m)   Wt 162 lb 9.6 oz (73.8 kg)   SpO2 97%   BMI 24.01 kg/m  Wt Readings from Last 3 Encounters:  07/26/16 162 lb 9.6 oz (73.8 kg)  06/28/16 162 lb 3.2 oz (73.6 kg)  01/10/16 159 lb 12.8 oz (72.5 kg)   BP Readings from Last 3 Encounters:  07/26/16 (!) 142/78  06/28/16 120/82  01/10/16 130/84     Immunization History  Administered Date(s) Administered  . Pneumococcal Polysaccharide-23 06/07/2008  . Zoster 07/17/2013    Health Maintenance  Topic Date Due  . FOOT EXAM  07/25/1950  . OPHTHALMOLOGY EXAM  07/25/1950  . TETANUS/TDAP  07/25/1959  . PNA vac Low Risk Adult (2 of 2 - PCV13) 06/07/2009  . HEMOGLOBIN A1C  12/31/2011  . COLONOSCOPY  11/28/2015  . INFLUENZA VACCINE  08/05/2026 (Originally 12/06/2015)  . DEXA SCAN  Completed    Lab Results  Component Value Date   WBC 7.6 06/28/2016   HGB 14.6 06/28/2016   HCT 42.9 06/28/2016   PLT 360.0 06/28/2016   GLUCOSE 92 06/28/2016   CHOL 177 01/02/2016   TRIG 228.0 (H) 01/02/2016   HDL 41.30 01/02/2016   LDLDIRECT 106.0 01/02/2016   LDLCALC 94 11/17/2014   ALT 11 06/28/2016   AST 18 06/28/2016   NA 140 06/28/2016   K 4.8 06/28/2016   CL 102 06/28/2016   CREATININE 0.75 06/28/2016   BUN 18 06/28/2016   CO2 33 (H) 06/28/2016   TSH 2.51 11/17/2014   INR 1.04 07/23/2014   HGBA1C 6.1 07/03/2011    Lab Results  Component Value Date   TSH 2.51 11/17/2014   Lab Results  Component Value Date   WBC 7.6 06/28/2016    HGB 14.6 06/28/2016   HCT 42.9 06/28/2016   MCV 89.0 06/28/2016   PLT 360.0 06/28/2016   Lab Results  Component Value Date   NA 140 06/28/2016   K 4.8 06/28/2016   CHLORIDE 105 01/18/2015   CO2 33 (H) 06/28/2016   GLUCOSE 92 06/28/2016   BUN 18 06/28/2016   CREATININE 0.75 06/28/2016   BILITOT 0.6 06/28/2016   ALKPHOS 59 06/28/2016   AST 18 06/28/2016   ALT 11 06/28/2016   PROT 7.4 06/28/2016   ALBUMIN 4.7 06/28/2016   CALCIUM 10.5 06/28/2016   ANIONGAP 7 01/18/2015   EGFR 63 (L) 01/18/2015   GFR 79.87 06/28/2016   Lab Results  Component Value Date   CHOL 177 01/02/2016   Lab Results  Component Value Date   HDL 41.30 01/02/2016   Lab Results  Component Value Date   LDLCALC 94 11/17/2014   Lab Results  Component Value Date   TRIG 228.0 (H) 01/02/2016   Lab Results  Component Value Date   CHOLHDL 4 01/02/2016   Lab Results  Component Value Date   HGBA1C 6.1 07/03/2011         Assessment & Plan:   Problem List Items Addressed This Visit    None    Visit Diagnoses    Right ear pain    -  Primary   Relevant Orders   Ambulatory referral to ENT   Environmental allergies       Relevant Medications   levocetirizine (XYZAL) 5 MG tablet   Acute upper respiratory infection  Relevant Medications   Zoster Vac Recomb Adjuvanted (SHINGRIX) 50 MCG SUSR   Need for shingles vaccine       Relevant Medications   Zoster Vac Recomb Adjuvanted (SHINGRIX) 50 MCG SUSR   Chronic pain syndrome       Relevant Medications   oxyCODONE-acetaminophen (ROXICET) 5-325 MG tablet      I have discontinued Ms. Galyon Azelastine-Fluticasone and ciprofloxacin. I am also having her start on Zoster Vac Recomb Adjuvanted. Additionally, I am having her maintain her Vitamin D3, b complex vitamins, Biotin, triamcinolone cream, ipratropium, zolpidem, desvenlafaxine, ibuprofen, diltiazem, metoprolol tartrate, pravastatin, quinapril, Diclofenac Sodium, omeprazole, ALPRAZolam,  levocetirizine, and oxyCODONE-acetaminophen.  Meds ordered this encounter  Medications  . levocetirizine (XYZAL) 5 MG tablet    Sig: Take 1 tablet (5 mg total) by mouth every evening.    Dispense:  30 tablet    Refill:  5  . DISCONTD: Azelastine-Fluticasone 137-50 MCG/ACT SUSP    Sig: Place 1 spray into the nose 2 (two) times daily as needed.    Dispense:  1 Bottle    Refill:  5  . Zoster Vac Recomb Adjuvanted (SHINGRIX) 50 MCG SUSR    Sig: Inject 0.5 mLs into the muscle once. Repeat in 2-6 months    Dispense:  1 each    Refill:  1  . oxyCODONE-acetaminophen (ROXICET) 5-325 MG tablet    Sig: Take 1-2 tablets by mouth every 4 (four) hours as needed.    Dispense:  90 tablet    Refill:  0    CMA served as scribe during this visit. History, Physical and Plan performed by medical provider. Documentation and orders reviewed and attested to.  Ann Held, DO   Patient ID: ALYSIANA ETHRIDGE, female   DOB: [birth year], 76 y.o.   MRN: [id number]

## 2016-08-14 ENCOUNTER — Other Ambulatory Visit: Payer: Self-pay | Admitting: Family Medicine

## 2016-08-14 DIAGNOSIS — L309 Dermatitis, unspecified: Secondary | ICD-10-CM

## 2016-08-20 ENCOUNTER — Telehealth: Payer: Self-pay | Admitting: Family Medicine

## 2016-08-20 NOTE — Telephone Encounter (Signed)
Receive fax from [facility]. She has been approved through their program for PRISTIQ---WILL BE SHIPPED TO OUR OFFICE. She has been approved through 08/16/2017 Called to notify the patient of her acceptance.

## 2016-08-21 DIAGNOSIS — M2669 Other specified disorders of temporomandibular joint: Secondary | ICD-10-CM | POA: Diagnosis not present

## 2016-08-21 DIAGNOSIS — H9201 Otalgia, right ear: Secondary | ICD-10-CM | POA: Diagnosis not present

## 2016-08-27 ENCOUNTER — Telehealth: Payer: Self-pay | Admitting: Family Medicine

## 2016-08-27 ENCOUNTER — Telehealth: Payer: Self-pay | Admitting: *Deleted

## 2016-08-27 DIAGNOSIS — M158 Other polyosteoarthritis: Secondary | ICD-10-CM

## 2016-08-27 NOTE — Telephone Encounter (Signed)
Caller name: Relationship to patient: Self Can be reached: [phone]  Pharmacy:  [facility], [place]. [phone] (Phone) [phone] (Fax)     Reason for call: Refill ibuprofen (ADVIL,MOTRIN) 800 MG tablet

## 2016-08-27 NOTE — Telephone Encounter (Signed)
Lmom with gentleman that medication was ready for pickup.  Pristiq.  It is at [facility] when patient comes in.

## 2016-08-28 MED ORDER — IBUPROFEN 800 MG PO TABS: 800.0000 mg | ORAL_TABLET | Freq: Three times a day (TID) | ORAL | 0 refills | Status: DC | PRN

## 2016-08-28 NOTE — Telephone Encounter (Signed)
Sent rx to pharmacy. LB 

## 2016-08-30 NOTE — Telephone Encounter (Signed)
Pt picked up meds.

## 2016-09-10 ENCOUNTER — Other Ambulatory Visit: Payer: Self-pay | Admitting: Family Medicine

## 2016-09-10 DIAGNOSIS — G894 Chronic pain syndrome: Secondary | ICD-10-CM

## 2016-09-10 MED ORDER — OXYCODONE-ACETAMINOPHEN 5-325 MG PO TABS: 1.0000 | ORAL_TABLET | ORAL | 0 refills | Status: DC | PRN

## 2016-09-10 NOTE — Telephone Encounter (Signed)
Indication for chronic opioid: chronic pain Medication and dose: oxycodone 5/325 # pills per month: #90 Last UDS date: 11/07/2016 Pain contract signed (Y/N): 09/28/2015 Date narcotic database last reviewed (include red flags): 09/10/2016  Ok to refill x1

## 2016-09-10 NOTE — Telephone Encounter (Signed)
Printed/PCP signed. Called the patient informed to pickup hardcopy at the front desk. 

## 2016-09-10 NOTE — Telephone Encounter (Signed)
°  Relation to OB:SJGG Call back number:[phone]  Reason for call:  Patient requesting a refill oxyCODONE-acetaminophen (ROXICET) 5-325 MG tablet

## 2016-09-10 NOTE — Telephone Encounter (Signed)
Requesting:   oxycodone Contract     09/28/2015 UDS    Low risk on 11/07/2016 Last OV     07/26/16 Last Refil    #90 07/26/16  Please Advise

## 2016-09-11 ENCOUNTER — Other Ambulatory Visit: Payer: Self-pay | Admitting: Family Medicine

## 2016-09-11 MED ORDER — METOPROLOL TARTRATE 25 MG PO TABS: 25.0000 mg | ORAL_TABLET | Freq: Two times a day (BID) | ORAL | 1 refills | Status: DC

## 2016-10-05 ENCOUNTER — Ambulatory Visit (INDEPENDENT_AMBULATORY_CARE_PROVIDER_SITE_OTHER): Payer: Medicare Other | Admitting: Family Medicine

## 2016-10-05 ENCOUNTER — Encounter: Payer: Self-pay | Admitting: Family Medicine

## 2016-10-05 VITALS — BP 130/76 | HR 74 | Temp 98.3°F | Resp 16 | Ht 69.0 in | Wt 160.6 lb

## 2016-10-05 DIAGNOSIS — E785 Hyperlipidemia, unspecified: Secondary | ICD-10-CM

## 2016-10-05 DIAGNOSIS — G894 Chronic pain syndrome: Secondary | ICD-10-CM

## 2016-10-05 DIAGNOSIS — G4700 Insomnia, unspecified: Secondary | ICD-10-CM

## 2016-10-05 LAB — LIPID PANEL
CHOL/HDL RATIO: 3.9 ratio (ref <5.0)
CHOLESTEROL: 180 mg/dL (ref <200)
HDL: 46 mg/dL — ABNORMAL LOW (ref >50)
LDL CALC: 90 mg/dL (ref <100)
Triglycerides: 218 mg/dL — ABNORMAL HIGH (ref <150)
VLDL: 44 mg/dL — AB (ref <30)

## 2016-10-05 LAB — COMPREHENSIVE METABOLIC PANEL
ALT: 9 U/L (ref 6–29)
AST: 14 U/L (ref 10–35)
Albumin: 4.3 g/dL (ref 3.6–5.1)
Alkaline Phosphatase: 60 U/L (ref 33–130)
BUN: 15 mg/dL (ref 7–25)
CHLORIDE: 102 mmol/L (ref 98–110)
CO2: 28 mmol/L (ref 20–31)
Calcium: 10.2 mg/dL (ref 8.6–10.4)
Creat: 0.78 mg/dL (ref 0.60–0.93)
Glucose, Bld: 83 mg/dL (ref 65–99)
POTASSIUM: 4 mmol/L (ref 3.5–5.3)
Sodium: 140 mmol/L (ref 135–146)
TOTAL PROTEIN: 6.7 g/dL (ref 6.1–8.1)
Total Bilirubin: 0.6 mg/dL (ref 0.2–1.2)

## 2016-10-05 MED ORDER — TRAZODONE HCL 50 MG PO TABS: 25.0000 mg | ORAL_TABLET | Freq: Every evening | ORAL | 3 refills | Status: DC | PRN

## 2016-10-05 MED ORDER — OXYCODONE-ACETAMINOPHEN 5-325 MG PO TABS: 1.0000 | ORAL_TABLET | ORAL | 0 refills | Status: DC | PRN

## 2016-10-05 NOTE — Patient Instructions (Signed)

## 2016-10-05 NOTE — Progress Notes (Signed)
Patient ID: Amanda Joyce, female   DOB: [birth year], 76 y.o.   MRN: [id number]     Subjective:  I acted as a Education administrator for Dr. Carollee Herter.  [name], [place]   Patient ID: Amanda Joyce, female    DOB: 12-13-1940, 76 y.o.   MRN: [id number]  Chief Complaint  Patient presents with  . Insomnia    HPI  Patient is in today for insomnia.  Started after she stopped working.  She had been on Ambien but felt like she did not need it but she found an old prescription of Ambien 5 and took it and she got some sleep.  She would like a refill.  Patient Care Team: Carollee Herter, Alferd Apa, DO as PCP - General Tommy Medal, Lavell Islam, MD as Referring Physician (Infectious Diseases) Elsie Stain, MD as Consulting Physician (Pulmonary Disease)   Past Medical History:  Diagnosis Date  . Anxiety   . Arthritis    neck, shoulders, back, knees   . Breast cancer ([place])    Left   . Depression   . GERD (gastroesophageal reflux disease)   . Gout   . Hiatal hernia   . History of stress test >10 yrs. ago   in [place], [place] - no need for follow up  . Hyperlipemia   . Hypertension   . Irritable bowel syndrome   . Nontoxic uninodular goiter   . Osteoarthrosis, unspecified whether generalized or localized, unspecified site   . Personal history of malignant neoplasm of breast   . Temporomandibular joint disorders, unspecified     Past Surgical History:  Procedure Laterality Date  . ABDOMINAL HYSTERECTOMY    . APPENDECTOMY    . BREAST LUMPECTOMY     Left breast X2  . EYE SURGERY Bilateral    /w iol  . HERNIA REPAIR    . THYROID SURGERY    . TOTAL KNEE ARTHROPLASTY Right 08/04/2014   Procedure: RIGHT TOTAL KNEE ARTHROPLASTY;  Surgeon: Kathryne Hitch, MD;  Location: [facility];  Service: Orthopedics;  Laterality: Right;    Family History  Problem Relation Age of Onset  . Coronary artery disease Father   . Liver cancer Paternal Uncle   . Alcohol abuse Paternal Uncle   . Colon cancer Neg Hx   . Other Mother          deceased from brain tumor    Social History   Social History  . Marital status: Single    Spouse name: N/A  . Number of children: 2  . Years of education: N/A   Occupational History  . 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 Smoking status: Never Smoker  . Smokeless tobacco: Never Used  . Alcohol use No  . Drug use: No  . Sexual activity: Not on file   Other Topics Concern  . Not on file   Social History Narrative   2 caffeine drinks daily     Outpatient Medications Prior to Visit  Medication Sig Dispense Refill  . ALPRAZolam (XANAX) 0.5 MG tablet Take 1 tablet (0.5 mg total) by mouth 3 (three) times daily as needed for anxiety or sleep. 30 tablet 0  . azelastine (ASTELIN) 0.1 % nasal spray Place 1 spray into both nostrils 2 (two) times daily. Use in each nostril as directed 30 mL 12  . b complex vitamins capsule Take 1 capsule by mouth daily.    . Biotin  5000 MCG CAPS Take 5,000 mcg by mouth daily.    . Cholecalciferol (VITAMIN D3) 2000 UNITS capsule Take 4,000 Units by mouth daily.    Marland Kitchen desvenlafaxine (PRISTIQ) 100 MG 24 hr tablet Take 1 tablet (100 mg total) by mouth daily.    . Diclofenac Sodium (PENNSAID) 2 % SOLN Place onto the skin. Apply to knee daily as needed    . diltiazem (CARTIA XT) 240 MG 24 hr capsule TAKE 1 BY MOUTH DAILY 90 capsule 1  . fluticasone (FLONASE) 50 MCG/ACT nasal spray Place 1 spray into both nostrils 2 (two) times daily. 16 g 11  . ibuprofen (ADVIL,MOTRIN) 800 MG tablet Take 1 tablet (800 mg total) by mouth every 8 (eight) hours as needed. 90 tablet 0  . ipratropium (ATROVENT) 0.06 % nasal spray Place 2 sprays into the nose 3 (three) times daily as needed. 15 mL 5  . levocetirizine (XYZAL) 5 MG tablet Take 1 tablet (5 mg total) by mouth every evening. 30 tablet 5  . metoprolol tartrate (LOPRESSOR) 25 MG tablet Take 1 tablet (25 mg total) by mouth 2 (two) times daily. 180 tablet 1  . omeprazole  (PRILOSEC) 40 MG capsule Take 1 capsule (40 mg total) by mouth daily. 30 capsule 2  . pravastatin (PRAVACHOL) 20 MG tablet TAKE 1 BY MOUTH DAILY 90 tablet 1  . quinapril (ACCUPRIL) 40 MG tablet TAKE 1 BY MOUTH DAILY 90 tablet 1  . triamcinolone cream (KENALOG) 0.1 % APPLY  CREAM TOPICALLY TWICE DAILY 30 g 5  . zolpidem (AMBIEN) 5 MG tablet Take 1 tablet (5 mg total) by mouth at bedtime as needed for sleep. 15 tablet 1  . oxyCODONE-acetaminophen (ROXICET) 5-325 MG tablet Take 1-2 tablets by mouth every 4 (four) hours as needed. 90 tablet 0   No facility-administered medications prior to visit.     Allergies  Allergen Reactions  . Clarithromycin     REACTION: NIGHTMARES  . Influenza Virus Vaccine Split Nausea And Vomiting  . Penicillins     REACTION: ITCHING  . Soma [Carisoprodol] Other (See Comments)    nightmares    Review of Systems  Constitutional: Negative for fever and malaise/fatigue.  HENT: Negative for congestion.   Eyes: Negative for blurred vision.  Respiratory: Negative for cough and shortness of breath.   Cardiovascular: Negative for chest pain, palpitations and leg swelling.  Gastrointestinal: Negative for vomiting.  Musculoskeletal: Negative for back pain.  Skin: Negative for rash.  Neurological: Negative for loss of consciousness and headaches.  Psychiatric/Behavioral: The patient has insomnia.        Objective:    Physical Exam  Constitutional: She is oriented to person, place, and time. She appears well-developed and well-nourished. No distress.  HENT:  Head: Normocephalic and atraumatic.  Eyes: Conjunctivae are normal.  Neck: Normal range of motion. No thyromegaly present.  Cardiovascular: Normal rate and regular rhythm.   Pulmonary/Chest: Effort normal and breath sounds normal. She has no wheezes.  Abdominal: Soft. Bowel sounds are normal. There is no tenderness.  Musculoskeletal: Normal range of motion. She exhibits no edema or deformity.    Neurological: She is alert and oriented to person, place, and time.  Skin: Skin is warm and dry. She is not diaphoretic.  Psychiatric: She has a normal mood and affect.    BP 130/76 (BP Location: Right Arm, Cuff Size: Normal)   Pulse 74   Temp 98.3 F (36.8 C) (Oral)   Resp 16   Ht _0  (1.753 m)  Wt 160 lb 9.6 oz (72.8 kg)   SpO2 96%   BMI 23.72 kg/m  Wt Readings from Last 3 Encounters:  10/05/16 160 lb 9.6 oz (72.8 kg)  07/26/16 162 lb 9.6 oz (73.8 kg)  06/28/16 162 lb 3.2 oz (73.6 kg)   BP Readings from Last 3 Encounters:  10/05/16 130/76  07/26/16 (!) 142/78  06/28/16 120/82     Immunization History  Administered Date(s) Administered  . Pneumococcal Polysaccharide-23 06/07/2008  . Zoster 07/17/2013    Health Maintenance  Topic Date Due  . FOOT EXAM  07/25/1950  . OPHTHALMOLOGY EXAM  07/25/1950  . TETANUS/TDAP  07/25/1959  . PNA vac Low Risk Adult (2 of 2 - PCV13) 06/07/2009  . HEMOGLOBIN A1C  12/31/2011  . COLONOSCOPY  11/28/2015  . INFLUENZA VACCINE  08/05/2026 (Originally 12/05/2016)  . DEXA SCAN  Completed    Lab Results  Component Value Date   WBC 7.6 06/28/2016   HGB 14.6 06/28/2016   HCT 42.9 06/28/2016   PLT 360.0 06/28/2016   GLUCOSE 83 10/05/2016   CHOL 180 10/05/2016   TRIG 218 (H) 10/05/2016   HDL 46 (L) 10/05/2016   LDLDIRECT 106.0 01/02/2016   LDLCALC 90 10/05/2016   ALT 9 10/05/2016   AST 14 10/05/2016   NA 140 10/05/2016   K 4.0 10/05/2016   CL 102 10/05/2016   CREATININE 0.78 10/05/2016   BUN 15 10/05/2016   CO2 28 10/05/2016   TSH 2.51 11/17/2014   INR 1.04 07/23/2014   HGBA1C 6.1 07/03/2011    Lab Results  Component Value Date   TSH 2.51 11/17/2014   Lab Results  Component Value Date   WBC 7.6 06/28/2016   HGB 14.6 06/28/2016   HCT 42.9 06/28/2016   MCV 89.0 06/28/2016   PLT 360.0 06/28/2016   Lab Results  Component Value Date   NA 140 10/05/2016   K 4.0 10/05/2016   CHLORIDE 105 01/18/2015   CO2 28  10/05/2016   GLUCOSE 83 10/05/2016   BUN 15 10/05/2016   CREATININE 0.78 10/05/2016   BILITOT 0.6 10/05/2016   ALKPHOS 60 10/05/2016   AST 14 10/05/2016   ALT 9 10/05/2016   PROT 6.7 10/05/2016   ALBUMIN 4.3 10/05/2016   CALCIUM 10.2 10/05/2016   ANIONGAP 7 01/18/2015   EGFR 63 (L) 01/18/2015   GFR 79.87 06/28/2016   Lab Results  Component Value Date   CHOL 180 10/05/2016   Lab Results  Component Value Date   HDL 46 (L) 10/05/2016   Lab Results  Component Value Date   LDLCALC 90 10/05/2016   Lab Results  Component Value Date   TRIG 218 (H) 10/05/2016   Lab Results  Component Value Date   CHOLHDL 3.9 10/05/2016   Lab Results  Component Value Date   HGBA1C 6.1 07/03/2011         Assessment & Plan:   Problem List Items Addressed This Visit      Unprioritized   Insomnia   Relevant Medications   traZODone (DESYREL) 50 MG tablet   Other Relevant Orders   Comp Met (CMET) (Completed)   Lipid panel (Completed)    Other Visit Diagnoses    Hyperlipidemia LDL goal <100    -  Primary   Relevant Orders   Comp Met (CMET) (Completed)   Lipid panel (Completed)   Chronic pain syndrome       Relevant Medications   oxyCODONE-acetaminophen (ROXICET) 5-325 MG tablet   Other Relevant Orders  Comp Met (CMET) (Completed)   Lipid panel (Completed)      I am having Ms. Stegeman start on traZODone. I am also having her maintain her Vitamin D3, b complex vitamins, Biotin, ipratropium, zolpidem, desvenlafaxine, diltiazem, pravastatin, quinapril, Diclofenac Sodium, omeprazole, ALPRAZolam, levocetirizine, fluticasone, azelastine, triamcinolone cream, ibuprofen, metoprolol tartrate, and oxyCODONE-acetaminophen.  Meds ordered this encounter  Medications  . traZODone (DESYREL) 50 MG tablet    Sig: Take 0.5-1 tablets (25-50 mg total) by mouth at bedtime as needed for sleep.    Dispense:  30 tablet    Refill:  3  . oxyCODONE-acetaminophen (ROXICET) 5-325 MG tablet    Sig: Take  1-2 tablets by mouth every 4 (four) hours as needed.    Dispense:  90 tablet    Refill:  0    CMA served as scribe during this visit. History, Physical and Plan performed by medical provider. Documentation and orders reviewed and attested to.  Ann Held, DO

## 2016-10-08 ENCOUNTER — Other Ambulatory Visit: Payer: Self-pay | Admitting: Family Medicine

## 2016-10-08 DIAGNOSIS — E785 Hyperlipidemia, unspecified: Secondary | ICD-10-CM

## 2016-10-08 MED ORDER — FENOFIBRATE 160 MG PO TABS: 160.0000 mg | ORAL_TABLET | Freq: Every day | ORAL | 3 refills | Status: DC

## 2016-10-11 ENCOUNTER — Telehealth: Payer: Self-pay | Admitting: Family Medicine

## 2016-10-11 NOTE — Telephone Encounter (Signed)
Patient is calling in asking if she can transfer care from Dr. Etter Sjogren to Dr. Sarajane Jews.  Patient is currently taking Hydrocodone 5mg  and asking for Ambien 5mg .  Patient states she had breast cancer in 2006 and takes the hydrocodone for the pain from that.  Patient also states she is having scary vivid dreams (being attacked by lions)  Patient was recommended to Dr. Sarajane Jews by [place].  I advised patient I would call and let her know what Dr. Sarajane Jews said before messaging Dr. Etter Sjogren.

## 2016-10-11 NOTE — Telephone Encounter (Signed)
I am sorry but I should decline this transfer. In the near future I will not be prescribing any narcotics, so it would make sense for her to establish with someone else

## 2016-10-12 DIAGNOSIS — M1711 Unilateral primary osteoarthritis, right knee: Secondary | ICD-10-CM | POA: Diagnosis not present

## 2016-10-15 ENCOUNTER — Telehealth: Payer: Self-pay | Admitting: Family Medicine

## 2016-10-15 DIAGNOSIS — G4700 Insomnia, unspecified: Secondary | ICD-10-CM

## 2016-10-15 NOTE — Telephone Encounter (Signed)
PT states the sleep medicine prescribed did not work it gave her night mares both nights. If possible call in [place] and she will use sparingly and if not that's fine she is trying tylenol pm per her grandaughter's recommendation.

## 2016-10-16 MED ORDER — ZOLPIDEM TARTRATE 5 MG PO TABS: 5.0000 mg | ORAL_TABLET | Freq: Every evening | ORAL | 0 refills | Status: DC | PRN

## 2016-10-16 NOTE — Telephone Encounter (Signed)
Patient informed refill done and patient informed of PCP instructions. She verbalized understanding.

## 2016-10-16 NOTE — Telephone Encounter (Signed)
Called the patient left a message to call back Faxed refill to [facility]

## 2016-10-16 NOTE — Telephone Encounter (Signed)
ambien 5 mg #30  q po qhs prn  Would like this to last 3 months

## 2016-11-05 ENCOUNTER — Telehealth: Payer: Self-pay | Admitting: *Deleted

## 2016-11-05 NOTE — Telephone Encounter (Signed)
[facility] sent fax about ambien:  High Risk in med in elderly...consider d/c.  Please advise.  [facility]

## 2016-11-05 NOTE — Telephone Encounter (Signed)
Will have to wait for PCP since I do not know patient

## 2016-11-08 ENCOUNTER — Telehealth: Payer: Self-pay | Admitting: Family Medicine

## 2016-11-08 DIAGNOSIS — G894 Chronic pain syndrome: Secondary | ICD-10-CM

## 2016-11-08 MED ORDER — OXYCODONE-ACETAMINOPHEN 5-325 MG PO TABS: 1.0000 | ORAL_TABLET | ORAL | 0 refills | Status: DC | PRN

## 2016-11-08 NOTE — Telephone Encounter (Signed)
Requesting:   oxycodone Contract    10/05/2016 UDS   Low risk--next due date 11/07/16 Last OV     10/05/2016 Last Refill     #90 on 10/05/2016  Please Advise

## 2016-11-08 NOTE — Telephone Encounter (Signed)
Self   Refill for oxyCODONE-acetaminophen   CB: [phone]   Pharmacy: [place], [place].

## 2016-11-08 NOTE — Telephone Encounter (Signed)
NCCSR: last filled 30 day supply of oxycodone on 6/1 Will refill for her

## 2016-11-12 NOTE — Telephone Encounter (Signed)
She has been on in forever and we have tried alternatives  Ok to refill Pt aware of risk

## 2016-11-13 ENCOUNTER — Telehealth: Payer: Self-pay | Admitting: Family Medicine

## 2016-11-13 MED ORDER — OMEGA-3-ACID ETHYL ESTERS 1 G PO CAPS: ORAL_CAPSULE | ORAL | 2 refills | Status: AC

## 2016-11-13 NOTE — Telephone Encounter (Signed)
Pharmacy stated that we just write on next rx that we send. In.  She could not put a note in there computer that we said it was ok.

## 2016-11-13 NOTE — Telephone Encounter (Signed)
Called left message to call back. Sent in [place] to [facility].

## 2016-11-13 NOTE — Telephone Encounter (Signed)
Dr. Etter Sjogren please advise on fenofibrate.  I will call [facility] for the refill on pristiq

## 2016-11-13 NOTE — Telephone Encounter (Signed)
Can try lovaza 1000 mg 2 caps bid  #120  2 refills

## 2016-11-13 NOTE — Telephone Encounter (Signed)
Caller name: Relation to QP:YPPJ Call back number:[phone] Pharmacy:  Reason for call:  Pt states rx pristiq has to be called in every 3 months and sent to the office and she will pick up, and pt states rx fenofibrate and derivatives tablets are causing side effects of increased aches and pains and constipation really bad would like to know what options she has.

## 2016-11-15 NOTE — Telephone Encounter (Signed)
Patient notified about lovaza just trying to get into touch with [facility] to place order for pristiq.

## 2016-11-16 ENCOUNTER — Telehealth: Payer: Self-pay | Admitting: Family Medicine

## 2016-11-16 ENCOUNTER — Other Ambulatory Visit: Payer: Self-pay | Admitting: Family Medicine

## 2016-11-16 DIAGNOSIS — K649 Unspecified hemorrhoids: Secondary | ICD-10-CM

## 2016-11-16 NOTE — Telephone Encounter (Signed)
Refer to gi.  

## 2016-11-16 NOTE — Telephone Encounter (Signed)
Patient has a hemorrhoid/wiped too hard yesterday and saw some blood/has had hemorrhoids in the past and would like a referral to a MD to take care of.

## 2016-11-16 NOTE — Telephone Encounter (Signed)
Referral done

## 2016-11-16 NOTE — Telephone Encounter (Signed)
Called [facility] to reorder pristiq Order completed and will be shipped to our office #90 in 7 to 10 business day from today 11/16/2016 Order PH#[phone] Pristiq 100 mg #90  Patient notified order placed/shipping in process.

## 2016-11-23 NOTE — Telephone Encounter (Signed)
Called the patient to inform Pristiq has arrived in the office #90 tablets. Had to leave her a message/have put the medication in a bag at the front desk.

## 2016-12-10 ENCOUNTER — Telehealth: Payer: Self-pay | Admitting: Family Medicine

## 2016-12-10 DIAGNOSIS — M158 Other polyosteoarthritis: Secondary | ICD-10-CM

## 2016-12-10 DIAGNOSIS — G894 Chronic pain syndrome: Secondary | ICD-10-CM

## 2016-12-10 MED ORDER — IBUPROFEN 800 MG PO TABS: 800.0000 mg | ORAL_TABLET | Freq: Three times a day (TID) | ORAL | 0 refills | Status: DC | PRN

## 2016-12-10 MED ORDER — OXYCODONE-ACETAMINOPHEN 5-325 MG PO TABS: 1.0000 | ORAL_TABLET | ORAL | 0 refills | Status: DC | PRN

## 2016-12-10 NOTE — Telephone Encounter (Signed)
Requesting:   oxycodone Contract    10/05/2016 UDS   Low risk due on 11/07/2016 Last OV    10/05/2016 Last Refill    #90 no refills on 11/08/2016  Please Advise

## 2016-12-10 NOTE — Telephone Encounter (Signed)
Self.  Refill for oxyCODONE and also ibuprofen     CB: [phone]   Pharmacy: [place], [place].

## 2016-12-10 NOTE — Telephone Encounter (Signed)
PCP covering printed/signed Called the patient informed to pickup at the front desk.

## 2016-12-10 NOTE — Telephone Encounter (Signed)
OK to refill

## 2016-12-19 ENCOUNTER — Telehealth: Payer: Self-pay | Admitting: Family Medicine

## 2016-12-19 DIAGNOSIS — I10 Essential (primary) hypertension: Secondary | ICD-10-CM

## 2016-12-19 NOTE — Telephone Encounter (Signed)
Self.   Refill request for 3 medications.    1. Diltiazem   2. Quinapril   3. metoprolol tartrate     Pharmacy: [facility]    Pt would like to be notified once Rx has been sent so that she will know. Pt said that the original refill request was sent in on 12/08/16, not showing request.

## 2016-12-20 MED ORDER — QUINAPRIL HCL 40 MG PO TABS: ORAL_TABLET | ORAL | 1 refills | Status: DC

## 2016-12-20 MED ORDER — METOPROLOL TARTRATE 25 MG PO TABS: 25.0000 mg | ORAL_TABLET | Freq: Two times a day (BID) | ORAL | 1 refills | Status: DC

## 2016-12-20 MED ORDER — DILTIAZEM HCL ER COATED BEADS 240 MG PO CP24: ORAL_CAPSULE | ORAL | 1 refills | Status: DC

## 2016-12-20 NOTE — Telephone Encounter (Signed)
rxs [facility] to [facility].  Left message on machine that rxs have been sent.

## 2016-12-27 ENCOUNTER — Telehealth: Payer: Self-pay | Admitting: Family Medicine

## 2016-12-27 DIAGNOSIS — E785 Hyperlipidemia, unspecified: Secondary | ICD-10-CM

## 2016-12-27 NOTE — Telephone Encounter (Signed)
Self.   Refill for pravastatin    Pharmacy: [facility], [address]

## 2016-12-28 MED ORDER — PRAVASTATIN SODIUM 20 MG PO TABS: ORAL_TABLET | ORAL | 1 refills | Status: DC

## 2016-12-28 NOTE — Telephone Encounter (Signed)
Sent Rx to pharmacy. LB 

## 2017-01-04 ENCOUNTER — Other Ambulatory Visit: Payer: Self-pay | Admitting: Family Medicine

## 2017-01-04 DIAGNOSIS — G4700 Insomnia, unspecified: Secondary | ICD-10-CM

## 2017-01-04 NOTE — Telephone Encounter (Signed)
Faxed signed Rx to [facility] [address]/thx dmf

## 2017-01-04 NOTE — Telephone Encounter (Signed)
Requesting:   Zolpidem Contract    10/05/2016 UDS   Low risk due on 11/07/2016 Last OV    10/05/2016 Last Refill    6.13.18  Please Advise

## 2017-01-11 ENCOUNTER — Telehealth: Payer: Self-pay | Admitting: Family Medicine

## 2017-01-11 DIAGNOSIS — E785 Hyperlipidemia, unspecified: Secondary | ICD-10-CM

## 2017-01-11 DIAGNOSIS — G894 Chronic pain syndrome: Secondary | ICD-10-CM

## 2017-01-11 MED ORDER — PRAVASTATIN SODIUM 20 MG PO TABS: ORAL_TABLET | ORAL | 1 refills | Status: DC

## 2017-01-11 NOTE — Telephone Encounter (Signed)
Pravastatin was sent.   Dr. Etter Sjogren please advise on the Oxycodone  Last filled 86/18 Qty:90 RF:0

## 2017-01-11 NOTE — Telephone Encounter (Signed)
New Message   *STAT* If patient is at the pharmacy, call can be transferred to refill team.   1. Which medications need to be refilled? (please list name of each medication and dose if known)  Oxycodone 5-325 mg tablet  2. Which pharmacy/location (including street and city if local pharmacy) is medication to be sent to? N/A-Pick-up  3. Do they need a 30 day or 90 day supply?  30 day supply

## 2017-01-11 NOTE — Telephone Encounter (Signed)
Ok to refill x 1  

## 2017-01-11 NOTE — Telephone Encounter (Signed)
Follow up   *STAT* If patient is at the pharmacy, call can be transferred to refill team.   1. Which medications need to be refilled? (please list name of each medication and dose if known)  pravastatin (PRAVACHOL) 20 MG tablet   2. Which pharmacy/location (including street and city if local pharmacy) is medication to be sent to? [facility]  3. Do they need a 30 day or 90 day supply?  90 day supply

## 2017-01-14 MED ORDER — OXYCODONE-ACETAMINOPHEN 5-325 MG PO TABS: 1.0000 | ORAL_TABLET | ORAL | 0 refills | Status: DC | PRN

## 2017-01-14 NOTE — Telephone Encounter (Signed)
Pt is aware that rx has been placed up front for pick up. She had no additional questions at this time. Nothing further is needed

## 2017-01-14 NOTE — Telephone Encounter (Signed)
Rx printed awaiting physician sig

## 2017-01-14 NOTE — Telephone Encounter (Signed)
Rx signed will call pt

## 2017-01-23 ENCOUNTER — Other Ambulatory Visit: Payer: Self-pay

## 2017-01-23 DIAGNOSIS — E785 Hyperlipidemia, unspecified: Secondary | ICD-10-CM

## 2017-01-23 MED ORDER — PRAVASTATIN SODIUM 20 MG PO TABS: ORAL_TABLET | ORAL | 1 refills | Status: DC

## 2017-01-23 NOTE — Telephone Encounter (Signed)
Refaxed Pravastatin to corrected [place] pharmacy/thx dmf

## 2017-01-23 NOTE — Telephone Encounter (Signed)
Called pt to make her aware.     Thanks!

## 2017-02-07 ENCOUNTER — Ambulatory Visit (INDEPENDENT_AMBULATORY_CARE_PROVIDER_SITE_OTHER): Payer: Medicare Other | Admitting: Family Medicine

## 2017-02-07 ENCOUNTER — Encounter: Payer: Self-pay | Admitting: Family Medicine

## 2017-02-07 VITALS — BP 144/66 | HR 74 | Temp 97.9°F | Ht 69.0 in | Wt 163.0 lb

## 2017-02-07 DIAGNOSIS — I10 Essential (primary) hypertension: Secondary | ICD-10-CM

## 2017-02-07 DIAGNOSIS — G4700 Insomnia, unspecified: Secondary | ICD-10-CM

## 2017-02-07 DIAGNOSIS — G894 Chronic pain syndrome: Secondary | ICD-10-CM

## 2017-02-07 DIAGNOSIS — H9313 Tinnitus, bilateral: Secondary | ICD-10-CM

## 2017-02-07 DIAGNOSIS — N390 Urinary tract infection, site not specified: Secondary | ICD-10-CM

## 2017-02-07 DIAGNOSIS — R300 Dysuria: Secondary | ICD-10-CM

## 2017-02-07 LAB — POCT URINALYSIS DIPSTICK
Bilirubin, UA: NEGATIVE
Blood, UA: NEGATIVE
Glucose, UA: NEGATIVE
Ketones, UA: NEGATIVE
LEUKOCYTES UA: NEGATIVE
NITRITE UA: POSITIVE
PROTEIN UA: NEGATIVE
SPEC GRAV UA: 1.01 (ref 1.010–1.025)
UROBILINOGEN UA: 0.2 U/dL
pH, UA: 7 (ref 5.0–8.0)

## 2017-02-07 MED ORDER — OXYCODONE-ACETAMINOPHEN 5-325 MG PO TABS: 1.0000 | ORAL_TABLET | Freq: Three times a day (TID) | ORAL | 0 refills | Status: DC | PRN

## 2017-02-07 MED ORDER — CIPROFLOXACIN HCL 250 MG PO TABS: 250.0000 mg | ORAL_TABLET | Freq: Two times a day (BID) | ORAL | 0 refills | Status: DC

## 2017-02-07 MED ORDER — ZOLPIDEM TARTRATE 5 MG PO TABS: 5.0000 mg | ORAL_TABLET | Freq: Every evening | ORAL | 3 refills | Status: DC | PRN

## 2017-02-07 NOTE — Progress Notes (Signed)
Prepared for C&S/thx dmf

## 2017-02-07 NOTE — Progress Notes (Signed)
Patient ID: Amanda Joyce, female    DOB: 1940/11/05  Age: 76 y.o. MRN: [id number]    Subjective:  Subjective  HPI [name] presents for urinary urgency , frequency and low back pain x 2 weeks .  She also c/o ringing in the ears x several weeks She also needs refills on pain meds and is c/o about still not being able to sleep.  She really feels like the Lorrin Joyce is the only thing that has worked.  Review of Systems  Constitutional: Negative for chills, fatigue and fever.  HENT: Positive for tinnitus. Negative for congestion.   Respiratory: Negative for shortness of breath.   Cardiovascular: Negative for chest pain, palpitations and leg swelling.  Gastrointestinal: Negative for abdominal distention, abdominal pain, blood in stool and nausea.  Genitourinary: Positive for difficulty urinating, flank pain, frequency and urgency. Negative for dysuria.  Skin: Negative for rash.  Allergic/Immunologic: Negative for environmental allergies.  Neurological: Negative for dizziness and headaches.  Psychiatric/Behavioral: The patient is not nervous/anxious.     History Past Medical History:  Diagnosis Date  . Anxiety   . Arthritis    neck, shoulders, back, knees   . Breast cancer ([place])    Left   . Depression   . GERD (gastroesophageal reflux disease)   . Gout   . Hiatal hernia   . History of stress test >10 yrs. ago   in [place], [place] - no need for follow up  . Hyperlipemia   . Hypertension   . Irritable bowel syndrome   . Nontoxic uninodular goiter   . Osteoarthrosis, unspecified whether generalized or localized, unspecified site   . Personal history of malignant neoplasm of breast   . Temporomandibular joint disorders, unspecified     She has a past surgical history that includes Appendectomy; Hernia repair; Abdominal hysterectomy; Thyroid surgery; Breast lumpectomy; Eye surgery (Bilateral); and Total knee arthroplasty (Right, 08/04/2014).   Her family history includes Alcohol  abuse in her paternal uncle; Coronary artery disease in her father; Liver cancer in her paternal uncle; Other in her mother.She reports that she has never smoked. She has never used smokeless tobacco. She reports that she does not drink alcohol or use drugs.  Current Outpatient Prescriptions on File Prior to Visit  Medication Sig Dispense Refill  . azelastine (ASTELIN) 0.1 % nasal spray Place 1 spray into both nostrils 2 (two) times daily. Use in each nostril as directed 30 mL 12  . b complex vitamins capsule Take 1 capsule by mouth daily.    . Biotin 5000 MCG CAPS Take 5,000 mcg by mouth daily.    . Cholecalciferol (VITAMIN D3) 2000 UNITS capsule Take 4,000 Units by mouth daily.    Marland Kitchen desvenlafaxine (PRISTIQ) 100 MG 24 hr tablet Take 1 tablet (100 mg total) by mouth daily.    . Diclofenac Sodium (PENNSAID) 2 % SOLN Place onto the skin. Apply to knee daily as needed    . diltiazem (CARTIA XT) 240 MG 24 hr capsule TAKE 1 BY MOUTH DAILY 90 capsule 1  . fenofibrate 160 MG tablet Take 1 tablet (160 mg total) by mouth daily. 30 tablet 3  . fluticasone (FLONASE) 50 MCG/ACT nasal spray Place 1 spray into both nostrils 2 (two) times daily. 16 g 11  . ibuprofen (ADVIL,MOTRIN) 800 MG tablet Take 1 tablet (800 mg total) by mouth every 8 (eight) hours as needed. 90 tablet 0  . levocetirizine (XYZAL) 5 MG tablet Take 1 tablet (5 mg total) by  mouth every evening. 30 tablet 5  . metoprolol tartrate (LOPRESSOR) 25 MG tablet Take 1 tablet (25 mg total) by mouth 2 (two) times daily. 180 tablet 1  . omega-3 acid ethyl esters (LOVAZA) 1 g capsule Take 2 capsules twice daily by mouth 120 capsule 2  . omeprazole (PRILOSEC) 40 MG capsule Take 1 capsule (40 mg total) by mouth daily. 30 capsule 2  . oxyCODONE-acetaminophen (ROXICET) 5-325 MG tablet Take 1-2 tablets by mouth every 4 (four) hours as needed. 90 tablet 0  . pravastatin (PRAVACHOL) 20 MG tablet TAKE 1 BY MOUTH DAILY 90 tablet 1  . quinapril (ACCUPRIL) 40 MG  tablet TAKE 1 BY MOUTH DAILY 90 tablet 1  . triamcinolone cream (KENALOG) 0.1 % APPLY  CREAM TOPICALLY TWICE DAILY 30 g 5   No current facility-administered medications on file prior to visit.      Objective:  Objective  Physical Exam  Constitutional: She is oriented to person, place, and time. She appears well-developed and well-nourished.  HENT:  Head: Normocephalic and atraumatic.  Right Ear: External ear normal.  Left Ear: External ear normal.  Nose: Nose normal.  Mouth/Throat: Oropharynx is clear and moist. No oropharyngeal exudate.  Eyes: Conjunctivae and EOM are normal.  Neck: Normal range of motion. Neck supple. No JVD present. Carotid bruit is not present. No thyromegaly present.  Cardiovascular: Normal rate, regular rhythm and normal heart sounds.   No murmur heard. Pulmonary/Chest: Effort normal and breath sounds normal. No respiratory distress. She has no wheezes. She has no rales. She exhibits no tenderness.  Musculoskeletal: She exhibits no edema.  Neurological: She is alert and oriented to person, place, and time.  Psychiatric: She has a normal mood and affect.  Nursing note and vitals reviewed.  BP (!) 144/66 (BP Location: Right Arm, Patient Position: Sitting, Cuff Size: Normal)   Pulse 74   Temp 97.9 F (36.6 C) (Oral)   Ht 5\' 9"  (1.753 m)   Wt 163 lb (73.9 kg)   SpO2 97%   BMI 24.07 kg/m  Wt Readings from Last 3 Encounters:  02/07/17 163 lb (73.9 kg)  10/05/16 160 lb 9.6 oz (72.8 kg)  07/26/16 162 lb 9.6 oz (73.8 kg)     Lab Results  Component Value Date   WBC 8.0 02/07/2017   HGB 14.2 02/07/2017   HCT 42.2 02/07/2017   PLT 334.0 02/07/2017   GLUCOSE 103 (H) 02/07/2017   CHOL 180 10/05/2016   TRIG 218 (H) 10/05/2016   HDL 46 (L) 10/05/2016   LDLDIRECT 106.0 01/02/2016   LDLCALC 90 10/05/2016   ALT 9 02/07/2017   AST 15 02/07/2017   NA 139 02/07/2017   K 4.1 02/07/2017   CL 100 02/07/2017   CREATININE 0.91 02/07/2017   BUN 16 02/07/2017    CO2 30 02/07/2017   TSH 1.77 02/07/2017   INR 1.04 07/23/2014   HGBA1C 6.1 07/03/2011    US Abdomen Complete  Result Date: 06/29/2016 CLINICAL DATA:  Right upper quadrant pain . EXAM: ABDOMEN ULTRASOUND COMPLETE COMPARISON:  No recent . FINDINGS: Gallbladder: No gallstones or wall thickening visualized. No sonographic Murphy sign noted by sonographer. Common bile duct: Diameter: 4 mm Liver: No focal lesion identified. Within normal limits in parenchymal echogenicity. IVC: No abnormality visualized. Pancreas: Visualized portion unremarkable. Spleen: Size and appearance within normal limits. Right Kidney: Length: 10.6 cm. Echogenicity within normal limits. No mass or hydronephrosis visualized. Punctate 5 non echogenic focus is noted in the right kidney consistent with  small nonobstructing stone. Left Kidney: Length: 10.4 cm. Echogenicity within normal limits. No mass or hydronephrosis visualized. Punctate 6 mm echogenic focus in the left kidney consistent with nonobstructing stone. Abdominal aorta: Ectasia of the distal abdominal aorta up to 2.7 cm. Other findings: None. IMPRESSION: 1. 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 2. 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 Ectatic abdominal aorta at risk for aneurysm development. Recommend followup by ultrasound in 5 years. This recommendation follows ACR consensus guidelines: White Paper of the ACR Incidental Findings Committee II on Vascular Findings. J Am Coll Radiol 2013; 10:789-794. 3.  No acute abnormality identified. Electronically Signed   By: Marcello Moores  Register   On: 06/29/2016 11:14     Assessment & Plan:  Plan  I have discontinued Ms. Digiulio ipratropium, ALPRAZolam, and traZODone. I have also changed her zolpidem. Additionally, I am having her start on ciprofloxacin, oxyCODONE-acetaminophen, and oxyCODONE-acetaminophen. Lastly, I am having her maintain her Vitamin D3, b complex vitamins, Biotin, desvenlafaxine, Diclofenac Sodium,  omeprazole, levocetirizine, fluticasone, azelastine, triamcinolone cream, fenofibrate, omega-3 acid ethyl esters, ibuprofen, diltiazem, metoprolol tartrate, quinapril, oxyCODONE-acetaminophen, and pravastatin.  Meds ordered this encounter  Medications  . ciprofloxacin (CIPRO) 250 MG tablet    Sig: Take 1 tablet (250 mg total) by mouth 2 (two) times daily.    Dispense:  10 tablet    Refill:  0  . DISCONTD: oxyCODONE-acetaminophen (ROXICET) 5-325 MG tablet    Sig: Take 1 tablet by mouth every 8 (eight) hours as needed for severe pain.    Dispense:  20 tablet    Refill:  0  . oxyCODONE-acetaminophen (ROXICET) 5-325 MG tablet    Sig: Take 1 tablet by mouth every 8 (eight) hours as needed for severe pain.    Dispense:  20 tablet    Refill:  0  . oxyCODONE-acetaminophen (ROXICET) 5-325 MG tablet    Sig: Take 1 tablet by mouth every 8 (eight) hours as needed for severe pain.    Dispense:  20 tablet    Refill:  0  . zolpidem (AMBIEN) 5 MG tablet    Sig: Take 1 tablet (5 mg total) by mouth at bedtime as needed. for sleep    Dispense:  30 tablet    Refill:  3    Problem List Items Addressed This Visit      Unprioritized   Chronic pain syndrome    3 postdated rx printed rto 3 months for f/u      Relevant Medications   oxyCODONE-acetaminophen (ROXICET) 5-325 MG tablet   oxyCODONE-acetaminophen (ROXICET) 5-325 MG tablet   Essential hypertension    Well controlled, no changes to meds. Encouraged heart healthy diet such as the DASH diet and exercise as tolerated.        Insomnia    Refill ambien Pt understands the risks-- she will try to wean down to 3 x a week or less      Relevant Medications   zolpidem (AMBIEN) 5 MG tablet   Tinnitus aurium, bilateral    Refer to ent      Relevant Orders   Ambulatory referral to ENT   Vitamin B12 (Completed)   TSH (Completed)   CBC with Differential/Platelet (Completed)   Comprehensive metabolic panel (Completed)   Urinary tract  infection without hematuria - Primary    Culture sent cipro  rto prn      Relevant Medications   ciprofloxacin (CIPRO) 250 MG tablet    Other Visit Diagnoses    Dysuria  Relevant Orders   POCT urinalysis dipstick (Completed)   Urine Culture (Completed)      Follow-up: Return in about 3 months (around 05/10/2017), or if symptoms worsen or fail to improve.  Ann Held, DO

## 2017-02-07 NOTE — Patient Instructions (Signed)

## 2017-02-08 ENCOUNTER — Telehealth: Payer: Self-pay | Admitting: Family Medicine

## 2017-02-08 DIAGNOSIS — G894 Chronic pain syndrome: Secondary | ICD-10-CM

## 2017-02-08 LAB — URINE CULTURE
MICRO NUMBER:: 81104098
SPECIMEN QUALITY: ADEQUATE

## 2017-02-08 LAB — COMPREHENSIVE METABOLIC PANEL
ALBUMIN: 4.3 g/dL (ref 3.5–5.2)
ALK PHOS: 56 U/L (ref 39–117)
ALT: 9 U/L (ref 0–35)
AST: 15 U/L (ref 0–37)
BILIRUBIN TOTAL: 0.6 mg/dL (ref 0.2–1.2)
BUN: 16 mg/dL (ref 6–23)
CHLORIDE: 100 meq/L (ref 96–112)
CO2: 30 mEq/L (ref 19–32)
CREATININE: 0.91 mg/dL (ref 0.40–1.20)
Calcium: 10.1 mg/dL (ref 8.4–10.5)
GFR: 63.79 mL/min (ref >60.00)
Glucose, Bld: 103 mg/dL — ABNORMAL HIGH (ref 70–99)
Potassium: 4.1 mEq/L (ref 3.5–5.1)
Sodium: 139 mEq/L (ref 135–145)
TOTAL PROTEIN: 7.1 g/dL (ref 6.0–8.3)

## 2017-02-08 LAB — CBC WITH DIFFERENTIAL/PLATELET
Basophils Absolute: 0.1 10*3/uL (ref 0.0–0.1)
Basophils Relative: 1.8 % (ref 0.0–3.0)
EOS ABS: 0.2 10*3/uL (ref 0.0–0.7)
Eosinophils Relative: 2.5 % (ref 0.0–5.0)
HCT: 42.2 % (ref 36.0–46.0)
HEMOGLOBIN: 14.2 g/dL (ref 12.0–15.0)
Lymphocytes Relative: 25.8 % (ref 12.0–46.0)
Lymphs Abs: 2.1 10*3/uL (ref 0.7–4.0)
MCHC: 33.6 g/dL (ref 30.0–36.0)
MCV: 89.9 fl (ref 78.0–100.0)
MONO ABS: 0.7 10*3/uL (ref 0.1–1.0)
Monocytes Relative: 8.1 % (ref 3.0–12.0)
Neutro Abs: 5 10*3/uL (ref 1.4–7.7)
Neutrophils Relative %: 61.8 % (ref 43.0–77.0)
Platelets: 334 10*3/uL (ref 150.0–400.0)
RBC: 4.69 Mil/uL (ref 3.87–5.11)
RDW: 13.1 % (ref 11.5–15.5)
WBC: 8 10*3/uL (ref 4.0–10.5)

## 2017-02-08 LAB — VITAMIN B12: VITAMIN B 12: 328 pg/mL (ref 211–911)

## 2017-02-08 LAB — TSH: TSH: 1.77 u[IU]/mL (ref 0.35–4.50)

## 2017-02-08 MED ORDER — OXYCODONE-ACETAMINOPHEN 5-325 MG PO TABS: 1.0000 | ORAL_TABLET | Freq: Three times a day (TID) | ORAL | 0 refills | Status: DC | PRN

## 2017-02-08 NOTE — Telephone Encounter (Signed)
YL-Plz see pt message below/thx dmf

## 2017-02-08 NOTE — Telephone Encounter (Signed)
Printed #90 Called the patient informed to pickup at her convenience---but must return #20 before getting the corrected on. She verbalized understanding.

## 2017-02-08 NOTE — Telephone Encounter (Signed)
Ok to change to 90.

## 2017-02-08 NOTE — Telephone Encounter (Signed)
Pt was seen and received her Rx for oxyCODONE. Pt says that the Rx is only for 20 pills, pt says that it should be for 90 as usual. She would like to bring incorrect Rx back to office and have provider give her the correct one. Pt says that she would like to bring it back and pick up on Monday if possible.

## 2017-02-10 DIAGNOSIS — H9313 Tinnitus, bilateral: Secondary | ICD-10-CM | POA: Insufficient documentation

## 2017-02-10 DIAGNOSIS — G894 Chronic pain syndrome: Secondary | ICD-10-CM | POA: Insufficient documentation

## 2017-02-10 NOTE — Assessment & Plan Note (Signed)
Refer to ent 

## 2017-02-10 NOTE — Assessment & Plan Note (Signed)
Refill ambien Pt understands the risks-- she will try to wean down to 3 x a week or less

## 2017-02-10 NOTE — Assessment & Plan Note (Signed)
Well controlled, no changes to meds. Encouraged heart healthy diet such as the DASH diet and exercise as tolerated.  °

## 2017-02-10 NOTE — Assessment & Plan Note (Signed)
Culture sent cipro  rto prn

## 2017-02-10 NOTE — Assessment & Plan Note (Signed)
3 postdated rx printed rto 3 months for f/u

## 2017-02-11 ENCOUNTER — Other Ambulatory Visit: Payer: Self-pay | Admitting: Family Medicine

## 2017-02-11 DIAGNOSIS — E119 Type 2 diabetes mellitus without complications: Secondary | ICD-10-CM

## 2017-02-11 DIAGNOSIS — E538 Deficiency of other specified B group vitamins: Secondary | ICD-10-CM

## 2017-02-11 DIAGNOSIS — E785 Hyperlipidemia, unspecified: Secondary | ICD-10-CM

## 2017-02-11 DIAGNOSIS — Z1231 Encounter for screening mammogram for malignant neoplasm of breast: Secondary | ICD-10-CM

## 2017-02-12 ENCOUNTER — Telehealth: Payer: Self-pay | Admitting: Family Medicine

## 2017-02-12 ENCOUNTER — Other Ambulatory Visit: Payer: Self-pay | Admitting: Family Medicine

## 2017-02-12 DIAGNOSIS — G894 Chronic pain syndrome: Secondary | ICD-10-CM

## 2017-02-12 MED ORDER — OXYCODONE-ACETAMINOPHEN 5-325 MG PO TABS: 1.0000 | ORAL_TABLET | ORAL | 0 refills | Status: DC | PRN

## 2017-02-12 MED ORDER — OXYCODONE-ACETAMINOPHEN 5-325 MG PO TABS: 1.0000 | ORAL_TABLET | Freq: Three times a day (TID) | ORAL | 0 refills | Status: DC | PRN

## 2017-02-12 NOTE — Telephone Encounter (Signed)
Error

## 2017-02-26 ENCOUNTER — Ambulatory Visit
Admission: RE | Admit: 2017-02-26 | Discharge: 2017-02-26 | Disposition: A | Discharge status: Home / Self Care | Payer: Medicare Other | Source: Ambulatory Visit | Attending: Family Medicine | Admitting: Family Medicine

## 2017-02-26 DIAGNOSIS — Z1231 Encounter for screening mammogram for malignant neoplasm of breast: Secondary | ICD-10-CM | POA: Diagnosis not present

## 2017-02-27 ENCOUNTER — Other Ambulatory Visit: Payer: Self-pay | Admitting: Family Medicine

## 2017-02-27 DIAGNOSIS — R928 Other abnormal and inconclusive findings on diagnostic imaging of breast: Secondary | ICD-10-CM

## 2017-03-04 ENCOUNTER — Telehealth: Payer: Self-pay | Admitting: *Deleted

## 2017-03-04 ENCOUNTER — Ambulatory Visit: Payer: Medicare Other

## 2017-03-04 ENCOUNTER — Ambulatory Visit
Admission: RE | Admit: 2017-03-04 | Discharge: 2017-03-04 | Disposition: A | Discharge status: Home / Self Care | Payer: Medicare Other | Source: Ambulatory Visit | Attending: Family Medicine | Admitting: Family Medicine

## 2017-03-04 DIAGNOSIS — R928 Other abnormal and inconclusive findings on diagnostic imaging of breast: Secondary | ICD-10-CM

## 2017-03-04 NOTE — Telephone Encounter (Signed)
Received Physician Orders from [facility]; forwarded to provider/SLS 10/29

## 2017-03-06 DIAGNOSIS — H9313 Tinnitus, bilateral: Secondary | ICD-10-CM | POA: Diagnosis not present

## 2017-03-11 ENCOUNTER — Telehealth: Payer: Self-pay

## 2017-03-11 NOTE — Telephone Encounter (Signed)
PA initiated via Covermymeds; KEY: [id number]. Awaiting determination.

## 2017-03-11 NOTE — Telephone Encounter (Signed)
PA approved for generic zolpidem effective 03/11/2017 through 03/11/2018.

## 2017-03-14 ENCOUNTER — Telehealth: Payer: Self-pay | Admitting: Family Medicine

## 2017-03-14 NOTE — Telephone Encounter (Signed)
Pt would like to know if she could bring in a urine sample to provider today? Pt says that she was treated for a UTI a few weeks ago and has completed all of the medication. Pt says that she is still having the same symptoms, frequent urination, back pain. Pt says that she's not sure if it is gone.   Please advise.     CB: [phone]

## 2017-03-14 NOTE — Telephone Encounter (Signed)
PCP left early, and out of office tomorrow- will need appt w/ another provider.

## 2017-03-15 NOTE — Telephone Encounter (Signed)
Pt has been scheduled w/ PCP on Monday

## 2017-03-18 ENCOUNTER — Ambulatory Visit: Payer: Medicare Other | Admitting: Family Medicine

## 2017-03-18 ENCOUNTER — Encounter: Payer: Self-pay | Admitting: Family Medicine

## 2017-03-18 VITALS — BP 150/68 | HR 67 | Temp 98.5°F | Resp 16 | Ht 68.0 in | Wt 165.4 lb

## 2017-03-18 DIAGNOSIS — N390 Urinary tract infection, site not specified: Secondary | ICD-10-CM | POA: Diagnosis not present

## 2017-03-18 DIAGNOSIS — R300 Dysuria: Secondary | ICD-10-CM | POA: Diagnosis not present

## 2017-03-18 LAB — POC URINALSYSI DIPSTICK (AUTOMATED)
Bilirubin, UA: NEGATIVE
Glucose, UA: NEGATIVE
KETONES UA: NEGATIVE
Nitrite, UA: POSITIVE
PH UA: 7.5 (ref 5.0–8.0)
PROTEIN UA: NEGATIVE
SPEC GRAV UA: 1.01 (ref 1.010–1.025)
Urobilinogen, UA: 0.2 E.U./dL

## 2017-03-18 MED ORDER — SULFAMETHOXAZOLE-TRIMETHOPRIM 800-160 MG PO TABS: 1.0000 | ORAL_TABLET | Freq: Two times a day (BID) | ORAL | 0 refills | Status: DC

## 2017-03-18 NOTE — Patient Instructions (Signed)

## 2017-03-18 NOTE — Progress Notes (Signed)
Patient ID: Amanda Joyce, female    DOB: Dec 13, 1940  Age: 76 y.o. MRN: [id number]    Subjective:  Subjective  HPI Amanda Joyce presents for dysuria and frequency  Review of Systems  Constitutional: Negative for activity change, appetite change, chills, diaphoresis, fatigue, fever and unexpected weight change.  Eyes: Negative for pain, redness and visual disturbance.  Respiratory: Negative for cough, chest tightness, shortness of breath and wheezing.   Cardiovascular: Negative for chest pain, palpitations and leg swelling.  Gastrointestinal: Negative for abdominal distention and abdominal pain.  Endocrine: Negative for cold intolerance, heat intolerance, polydipsia, polyphagia and polyuria.  Genitourinary: Positive for dysuria, frequency and urgency. Negative for difficulty urinating, dyspareunia, flank pain, genital sores, hematuria, menstrual problem, pelvic pain, vaginal discharge and vaginal pain.  Musculoskeletal: Negative for back pain.  Neurological: Negative for dizziness, light-headedness, numbness and headaches.    History Past Medical History:  Diagnosis Date  . Anxiety   . Arthritis    neck, shoulders, back, knees   . Breast cancer ([place])    Left   . Depression   . GERD (gastroesophageal reflux disease)   . Gout   . Hiatal hernia   . History of stress test >10 yrs. ago   in [place], [place] - no need for follow up  . Hyperlipemia   . Hypertension   . Irritable bowel syndrome   . Nontoxic uninodular goiter   . Osteoarthrosis, unspecified whether generalized or localized, unspecified site   . Personal history of malignant neoplasm of breast   . Temporomandibular joint disorders, unspecified     She has a past surgical history that includes Appendectomy; Hernia repair; Abdominal hysterectomy; Thyroid surgery; Breast lumpectomy; Eye surgery (Bilateral); and RIGHT TOTAL KNEE ARTHROPLASTY (Right, 08/04/2014).   Her family history includes Alcohol abuse in her paternal  uncle; Coronary artery disease in her father; Liver cancer in her paternal uncle; Other in her mother.She reports that  has never smoked. she has never used smokeless tobacco. She reports that she does not drink alcohol or use drugs.  Current Outpatient Medications on File Prior to Visit  Medication Sig Dispense Refill  . azelastine (ASTELIN) 0.1 % nasal spray Place 1 spray into both nostrils 2 (two) times daily. Use in each nostril as directed 30 mL 12  . b complex vitamins capsule Take 1 capsule by mouth daily.    . Biotin 5000 MCG CAPS Take 5,000 mcg by mouth daily.    . Cholecalciferol (VITAMIN D3) 2000 UNITS capsule Take 4,000 Units by mouth daily.    . ciprofloxacin (CIPRO) 250 MG tablet Take 1 tablet (250 mg total) by mouth 2 (two) times daily. 10 tablet 0  . desvenlafaxine (PRISTIQ) 100 MG 24 hr tablet Take 1 tablet (100 mg total) by mouth daily.    . Diclofenac Sodium (PENNSAID) 2 % SOLN Place onto the skin. Apply to knee daily as needed    . diltiazem (CARTIA XT) 240 MG 24 hr capsule TAKE 1 BY MOUTH DAILY 90 capsule 1  . fenofibrate 160 MG tablet Take 1 tablet (160 mg total) by mouth daily. 30 tablet 3  . fluticasone (FLONASE) 50 MCG/ACT nasal spray Place 1 spray into both nostrils 2 (two) times daily. 16 g 11  . ibuprofen (ADVIL,MOTRIN) 800 MG tablet Take 1 tablet (800 mg total) by mouth every 8 (eight) hours as needed. 90 tablet 0  . levocetirizine (XYZAL) 5 MG tablet Take 1 tablet (5 mg total) by mouth every evening. 30 tablet  5  . metoprolol tartrate (LOPRESSOR) 25 MG tablet Take 1 tablet (25 mg total) by mouth 2 (two) times daily. 180 tablet 1  . omega-3 acid ethyl esters (LOVAZA) 1 g capsule Take 2 capsules twice daily by mouth 120 capsule 2  . omeprazole (PRILOSEC) 40 MG capsule Take 1 capsule (40 mg total) by mouth daily. 30 capsule 2  . oxyCODONE-acetaminophen (ROXICET) 5-325 MG tablet Take 1 tablet by mouth every 8 (eight) hours as needed for severe pain. 20 tablet 0  .  oxyCODONE-acetaminophen (ROXICET) 5-325 MG tablet Take 1-2 tablets by mouth every 4 (four) hours as needed. 90 tablet 0  . oxyCODONE-acetaminophen (ROXICET) 5-325 MG tablet Take 1 tablet by mouth every 8 (eight) hours as needed for severe pain. 90 tablet 0  . oxyCODONE-acetaminophen (ROXICET) 5-325 MG tablet Take 1 tablet by mouth every 8 (eight) hours as needed for severe pain. 90 tablet 0  . pravastatin (PRAVACHOL) 20 MG tablet TAKE 1 BY MOUTH DAILY 90 tablet 1  . quinapril (ACCUPRIL) 40 MG tablet TAKE 1 BY MOUTH DAILY 90 tablet 1  . triamcinolone cream (KENALOG) 0.1 % APPLY  CREAM TOPICALLY TWICE DAILY 30 g 5  . zolpidem (AMBIEN) 5 MG tablet Take 1 tablet (5 mg total) by mouth at bedtime as needed. for sleep 30 tablet 3   No current facility-administered medications on file prior to visit.      Objective:  Objective  Physical Exam  Abdominal: Soft. She exhibits no distension. There is no tenderness. There is no rebound, no guarding and no CVA tenderness.  Genitourinary: Pelvic exam was performed with patient supine. There is no rash, tenderness, lesion or injury on the right labia. There is no rash, tenderness, lesion or injury on the left labia. No erythema or tenderness in the vagina. No vaginal discharge found.  Psychiatric: She has a normal mood and affect. Her behavior is normal. Judgment and thought content normal.  Nursing note and vitals reviewed.  BP (!) 150/68 (BP Location: Right Arm, Patient Position: Sitting, Cuff Size: Small)   Pulse 67   Temp 98.5 F (36.9 C) (Oral)   Resp 16   Ht 5\' 8"  (1.727 m)   Wt 165 lb 6.4 oz (75 kg)   BMI 25.15 kg/m  Wt Readings from Last 3 Encounters:  03/18/17 165 lb 6.4 oz (75 kg)  02/07/17 163 lb (73.9 kg)  10/05/16 160 lb 9.6 oz (72.8 kg)     Lab Results  Component Value Date   WBC 8.0 02/07/2017   HGB 14.2 02/07/2017   HCT 42.2 02/07/2017   PLT 334.0 02/07/2017   GLUCOSE 103 (H) 02/07/2017   CHOL 180 10/05/2016   TRIG 218 (H)  10/05/2016   HDL 46 (L) 10/05/2016   LDLDIRECT 106.0 01/02/2016   LDLCALC 90 10/05/2016   ALT 9 02/07/2017   AST 15 02/07/2017   NA 139 02/07/2017   K 4.1 02/07/2017   CL 100 02/07/2017   CREATININE 0.91 02/07/2017   BUN 16 02/07/2017   CO2 30 02/07/2017   TSH 1.77 02/07/2017   INR 1.04 07/23/2014   HGBA1C 6.1 07/03/2011    Mm Diag Breast Tomo Uni Right  Result Date: 03/04/2017 CLINICAL DATA:  Possible asymmetry in the central right breast on a recent screening mammogram. Status post left lumpectomy, chemotherapy and radiation therapy for breast cancer in 2006. EXAM: 2D DIGITAL DIAGNOSTIC UNILATERAL RIGHT MAMMOGRAM WITH CAD AND ADJUNCT TOMO COMPARISON:  Previous exam(s). ACR Breast Density Category b: There  are scattered areas of fibroglandular density. FINDINGS: 2D and 3D true lateral and spot compression craniocaudal views of the right breast were obtained. These demonstrate normal appearing fibroglandular tissue at the location of recently suspected asymmetry, unchanged compared to previous examinations. Mammographic images were processed with CAD. IMPRESSION: No evidence of malignancy. The recently suspected right breast asymmetry was close apposition of normal breast tissue. RECOMMENDATION: Bilateral screening mammogram in 1 year. I have discussed the findings and recommendations with the patient. Results were also provided in writing at the conclusion of the visit. If applicable, a reminder letter will be sent to the patient regarding the next appointment. BI-RADS CATEGORY  1: Negative. Electronically Signed   By: Claudie Revering M.D.   On: 03/04/2017 11:27     Assessment & Plan:  Plan  I am having Amanda Joyce start on sulfamethoxazole-trimethoprim. I am also having her maintain her Vitamin D3, b complex vitamins, Biotin, desvenlafaxine, Diclofenac Sodium, omeprazole, levocetirizine, fluticasone, azelastine, triamcinolone cream, fenofibrate, omega-3 acid ethyl esters, ibuprofen,  diltiazem, metoprolol tartrate, quinapril, pravastatin, ciprofloxacin, oxyCODONE-acetaminophen, zolpidem, oxyCODONE-acetaminophen, oxyCODONE-acetaminophen, and oxyCODONE-acetaminophen.  Meds ordered this encounter  Medications  . sulfamethoxazole-trimethoprim (BACTRIM DS,SEPTRA DS) 800-160 MG tablet    Sig: Take 1 tablet 2 (two) times daily by mouth.    Dispense:  14 tablet    Refill:  0    Problem List Items Addressed This Visit      Unprioritized   Urinary tract infection without hematuria   Relevant Medications   sulfamethoxazole-trimethoprim (BACTRIM DS,SEPTRA DS) 800-160 MG tablet   Other Relevant Orders   POCT Urinalysis Dipstick (Automated)   Urine Culture    Other Visit Diagnoses    Dysuria    -  Primary   Relevant Medications   sulfamethoxazole-trimethoprim (BACTRIM DS,SEPTRA DS) 800-160 MG tablet   Other Relevant Orders   POCT Urinalysis Dipstick (Automated) (Completed)   Urine Culture      Follow-up: Return in about 2 weeks (around 04/01/2017), or if symptoms worsen or fail to improve, for lab appointment to repeat ua and culture.  Ann Held, DO

## 2017-03-19 LAB — URINE CULTURE
MICRO NUMBER:: 81271677
SPECIMEN QUALITY:: ADEQUATE

## 2017-03-21 ENCOUNTER — Telehealth: Payer: Self-pay | Admitting: Family Medicine

## 2017-03-21 NOTE — Telephone Encounter (Signed)
Pt called to request an Rx refill for pristiq 100mg .   Please advise [phone]

## 2017-03-25 NOTE — Telephone Encounter (Signed)
Order placed thru [employer] [employer]

## 2017-04-03 ENCOUNTER — Other Ambulatory Visit (INDEPENDENT_AMBULATORY_CARE_PROVIDER_SITE_OTHER): Payer: Medicare Other

## 2017-04-03 ENCOUNTER — Telehealth: Payer: Self-pay | Admitting: *Deleted

## 2017-04-03 DIAGNOSIS — N390 Urinary tract infection, site not specified: Secondary | ICD-10-CM | POA: Diagnosis not present

## 2017-04-03 LAB — POC URINALSYSI DIPSTICK (AUTOMATED)
Bilirubin, UA: NEGATIVE
GLUCOSE UA: NEGATIVE
Ketones, UA: NEGATIVE
NITRITE UA: POSITIVE
Protein, UA: NEGATIVE
Spec Grav, UA: 1.02 (ref 1.010–1.025)
UROBILINOGEN UA: 0.2 U/dL
pH, UA: 6 (ref 5.0–8.0)

## 2017-04-03 NOTE — Telephone Encounter (Signed)
Patient dropped off urine.  She states that she was put on Septra and she felt ok but her urine still is not clear and wants to know why she keep getting these infections.  Urine dip is resulted, culture being sent.  Should she be treated before the culture comes back.

## 2017-04-04 MED ORDER — CIPROFLOXACIN HCL 500 MG PO TABS: 500.0000 mg | ORAL_TABLET | Freq: Two times a day (BID) | ORAL | 0 refills | Status: DC

## 2017-04-04 NOTE — Telephone Encounter (Signed)
Culture pending ---  cipro 500 mg bid x 5 days  Refer to urology for recurrent uti  Repeat urine in 2 weeks

## 2017-04-04 NOTE — Telephone Encounter (Signed)
Patient notified.  She does not want to go urology at this time.  She will let us know.  She wants to try the different antibiotic first and will let us know about the referral.

## 2017-04-05 ENCOUNTER — Other Ambulatory Visit: Payer: Self-pay

## 2017-04-05 LAB — URINE CULTURE
MICRO NUMBER:: 81336240
SPECIMEN QUALITY:: ADEQUATE

## 2017-04-05 MED ORDER — NITROFURANTOIN MONOHYD MACRO 100 MG PO CAPS: 100.0000 mg | ORAL_CAPSULE | Freq: Two times a day (BID) | ORAL | 0 refills | Status: DC

## 2017-04-08 ENCOUNTER — Telehealth: Payer: Self-pay | Admitting: Family Medicine

## 2017-04-08 NOTE — Telephone Encounter (Signed)
Please call pharmacy --  We have been rx it with no problems  macrobid 100 mg bid x 5 days

## 2017-04-08 NOTE — Telephone Encounter (Signed)
Called RX to pharmacy. Per pharmacists request left Rx on answering machine at pharmacy. Patient notified.

## 2017-04-08 NOTE — Telephone Encounter (Signed)
Copied from [place] [id number]. Topic: General - Other >> Apr 08, 2017 10:39 AM Cecelia Byars, NT wrote: Reason for CRM: Patient was unable to get medicine because  pharmacy says last medicine,called in on Friday is no longer made,this is for a UTI she is still having issues with burning and  itching , she has taken prescription called in on Thursday and needs something else nothing has worked .Would like this taken care of before Friday [phone]

## 2017-05-10 ENCOUNTER — Encounter: Payer: Self-pay | Admitting: Family Medicine

## 2017-05-10 ENCOUNTER — Ambulatory Visit: Payer: Medicare Other | Admitting: Family Medicine

## 2017-05-10 VITALS — BP 138/88 | HR 76 | Temp 97.5°F | Ht 68.0 in | Wt 166.0 lb

## 2017-05-10 DIAGNOSIS — G4700 Insomnia, unspecified: Secondary | ICD-10-CM

## 2017-05-10 DIAGNOSIS — R350 Frequency of micturition: Secondary | ICD-10-CM | POA: Diagnosis not present

## 2017-05-10 DIAGNOSIS — E785 Hyperlipidemia, unspecified: Secondary | ICD-10-CM | POA: Diagnosis not present

## 2017-05-10 DIAGNOSIS — I10 Essential (primary) hypertension: Secondary | ICD-10-CM

## 2017-05-10 DIAGNOSIS — N390 Urinary tract infection, site not specified: Secondary | ICD-10-CM | POA: Diagnosis not present

## 2017-05-10 DIAGNOSIS — G894 Chronic pain syndrome: Secondary | ICD-10-CM

## 2017-05-10 LAB — POCT URINALYSIS DIPSTICK
Bilirubin, UA: NEGATIVE
Glucose, UA: NEGATIVE
KETONES UA: 5
PH UA: 6 (ref 5.0–8.0)
SPEC GRAV UA: 1.025 (ref 1.010–1.025)
UROBILINOGEN UA: 0.2 U/dL

## 2017-05-10 MED ORDER — NITROFURANTOIN MONOHYD MACRO 100 MG PO CAPS: 100.0000 mg | ORAL_CAPSULE | Freq: Two times a day (BID) | ORAL | 0 refills | Status: DC

## 2017-05-10 MED ORDER — OXYCODONE-ACETAMINOPHEN 5-325 MG PO TABS: 1.0000 | ORAL_TABLET | Freq: Three times a day (TID) | ORAL | 0 refills | Status: DC | PRN

## 2017-05-10 MED ORDER — ZOLPIDEM TARTRATE 5 MG PO TABS: 5.0000 mg | ORAL_TABLET | Freq: Every evening | ORAL | 3 refills | Status: DC | PRN

## 2017-05-10 MED ORDER — OXYCODONE-ACETAMINOPHEN 5-325 MG PO TABS: 1.0000 | ORAL_TABLET | ORAL | 0 refills | Status: DC | PRN

## 2017-05-10 NOTE — Progress Notes (Signed)
Subjective:  I acted as a Education administrator for [employer], [place]   Patient ID: Amanda Joyce, female    DOB: Sep 11, 1940, 77 y.o.   MRN: [id number]  Chief Complaint  Patient presents with  . Urinary Tract Infection  . Follow-up    HPI  Patient is in today for UTI -- dysuria and follow up bp , cholesterol.   No other complaints.    Patient Care Team: Carollee Herter, Alferd Apa, DO as PCP - General Tommy Medal, Lavell Islam, MD as Referring Physician (Infectious Diseases) Elsie Stain, MD as Consulting Physician (Pulmonary Disease)   Past Medical History:  Diagnosis Date  . Anxiety   . Arthritis    neck, shoulders, back, knees   . Breast cancer ([place])    Left   . Depression   . GERD (gastroesophageal reflux disease)   . Gout   . Hiatal hernia   . History of stress test >10 yrs. ago   in [place], [place] - no need for follow up  . Hyperlipemia   . Hypertension   . Irritable bowel syndrome   . Nontoxic uninodular goiter   . Osteoarthrosis, unspecified whether generalized or localized, unspecified site   . Personal history of malignant neoplasm of breast   . Temporomandibular joint disorders, unspecified     Past Surgical History:  Procedure Laterality Date  . ABDOMINAL HYSTERECTOMY    . APPENDECTOMY    . BREAST LUMPECTOMY     Left breast X2  . EYE SURGERY Bilateral    /w iol  . HERNIA REPAIR    . THYROID SURGERY    . TOTAL KNEE ARTHROPLASTY Right 08/04/2014   Procedure: RIGHT TOTAL KNEE ARTHROPLASTY;  Surgeon: Kathryne Hitch, MD;  Location: [facility];  Service: Orthopedics;  Laterality: Right;    Family History  Problem Relation Age of Onset  . Coronary artery disease Father   . Liver cancer Paternal Uncle   . Alcohol abuse Paternal Uncle   . Other Mother        deceased from brain tumor  . Colon cancer Neg Hx     Social History   Socioeconomic History  . Marital status: Single    Spouse name: Not on file  . Number of children: 2  . Years of education: Not on file    . Highest education level: Not on file  Social Needs  . Financial resource strain: Not on file  . Food insecurity - worry: Not on file  . Food insecurity - inability: Not on file  . Transportation needs - medical: Not on file  . Transportation needs - non-medical: Not on file  Occupational History  . Occupation: Retired    Comment: Financial planner: RETIRED  Tobacco Use  . Smoking status: Never Smoker  . Smokeless tobacco: Never Used  Substance and Sexual Activity  . Alcohol use: No  . Drug use: No  . Sexual activity: Not on file  Other Topics Concern  . Not on file  Social History Narrative   2 caffeine drinks daily     Outpatient Medications Prior to Visit  Medication Sig Dispense Refill  . azelastine (ASTELIN) 0.1 % nasal spray Place 1 spray into both nostrils 2 (two) times daily. Use in each nostril as directed 30 mL 12  . b complex vitamins capsule Take 1 capsule by mouth daily.    . Biotin 5000 MCG CAPS Take 5,000 mcg by mouth daily.    Marland Kitchen  Cholecalciferol (VITAMIN D3) 2000 UNITS capsule Take 4,000 Units by mouth daily.    Marland Kitchen desvenlafaxine (PRISTIQ) 100 MG 24 hr tablet Take 1 tablet (100 mg total) by mouth daily.    . Diclofenac Sodium (PENNSAID) 2 % SOLN Place onto the skin. Apply to knee daily as needed    . diltiazem (CARTIA XT) 240 MG 24 hr capsule TAKE 1 BY MOUTH DAILY 90 capsule 1  . fenofibrate 160 MG tablet Take 1 tablet (160 mg total) by mouth daily. 30 tablet 3  . fluticasone (FLONASE) 50 MCG/ACT nasal spray Place 1 spray into both nostrils 2 (two) times daily. 16 g 11  . ibuprofen (ADVIL,MOTRIN) 800 MG tablet Take 1 tablet (800 mg total) by mouth every 8 (eight) hours as needed. 90 tablet 0  . levocetirizine (XYZAL) 5 MG tablet Take 1 tablet (5 mg total) by mouth every evening. 30 tablet 5  . metoprolol tartrate (LOPRESSOR) 25 MG tablet Take 1 tablet (25 mg total) by mouth 2 (two) times daily. 180 tablet 1  . nitrofurantoin,  macrocrystal-monohydrate, (MACROBID) 100 MG capsule Take 1 capsule (100 mg total) by mouth 2 (two) times daily. 14 capsule 0  . omega-3 acid ethyl esters (LOVAZA) 1 g capsule Take 2 capsules twice daily by mouth 120 capsule 2  . omeprazole (PRILOSEC) 40 MG capsule Take 1 capsule (40 mg total) by mouth daily. 30 capsule 2  . pravastatin (PRAVACHOL) 20 MG tablet TAKE 1 BY MOUTH DAILY 90 tablet 1  . quinapril (ACCUPRIL) 40 MG tablet TAKE 1 BY MOUTH DAILY 90 tablet 1  . triamcinolone cream (KENALOG) 0.1 % APPLY  CREAM TOPICALLY TWICE DAILY 30 g 5  . ciprofloxacin (CIPRO) 500 MG tablet Take 1 tablet (500 mg total) by mouth 2 (two) times daily. 10 tablet 0  . oxyCODONE-acetaminophen (ROXICET) 5-325 MG tablet Take 1 tablet by mouth every 8 (eight) hours as needed for severe pain. 20 tablet 0  . oxyCODONE-acetaminophen (ROXICET) 5-325 MG tablet Take 1-2 tablets by mouth every 4 (four) hours as needed. 90 tablet 0  . oxyCODONE-acetaminophen (ROXICET) 5-325 MG tablet Take 1 tablet by mouth every 8 (eight) hours as needed for severe pain. 90 tablet 0  . oxyCODONE-acetaminophen (ROXICET) 5-325 MG tablet Take 1 tablet by mouth every 8 (eight) hours as needed for severe pain. 90 tablet 0  . sulfamethoxazole-trimethoprim (BACTRIM DS,SEPTRA DS) 800-160 MG tablet Take 1 tablet 2 (two) times daily by mouth. 14 tablet 0  . zolpidem (AMBIEN) 5 MG tablet Take 1 tablet (5 mg total) by mouth at bedtime as needed. for sleep 30 tablet 3   No facility-administered medications prior to visit.     Allergies  Allergen Reactions  . Clarithromycin     REACTION: NIGHTMARES  . Influenza Vac Split Quad Nausea And Vomiting  . Penicillins     REACTION: ITCHING  . Soma [Carisoprodol] Other (See Comments)    nightmares    Review of Systems  Constitutional: Negative for chills, fever and malaise/fatigue.  HENT: Negative for congestion and hearing loss.   Eyes: Negative for discharge.  Respiratory: Negative for cough,  sputum production and shortness of breath.   Cardiovascular: Negative for chest pain, palpitations and leg swelling.  Gastrointestinal: Negative for abdominal pain, blood in stool, constipation, diarrhea, heartburn, nausea and vomiting.  Genitourinary: Positive for dysuria. Negative for frequency, hematuria and urgency.  Musculoskeletal: Negative for back pain, falls and myalgias.  Skin: Negative for rash.  Neurological: Negative for dizziness, sensory change, loss  of consciousness, weakness and headaches.  Endo/Heme/Allergies: Negative for environmental allergies. Does not bruise/bleed easily.  Psychiatric/Behavioral: Negative for depression and suicidal ideas. The patient is not nervous/anxious and does not have insomnia.        Objective:    Physical Exam  Constitutional: She is oriented to person, place, and time. She appears well-developed and well-nourished.  HENT:  Head: Normocephalic and atraumatic.  Eyes: Conjunctivae and EOM are normal.  Neck: Normal range of motion. Neck supple. No JVD present. Carotid bruit is not present. No thyromegaly present.  Cardiovascular: Normal rate, regular rhythm and normal heart sounds.  No murmur heard. Pulmonary/Chest: Effort normal and breath sounds normal. No respiratory distress. She has no wheezes. She has no rales. She exhibits no tenderness.  Abdominal: Soft. There is no tenderness.  Musculoskeletal: She exhibits no edema.  Neurological: She is alert and oriented to person, place, and time.  Psychiatric: She has a normal mood and affect.  Nursing note and vitals reviewed.   BP 138/88   Pulse 76   Temp (!) 97.5 F (36.4 C) (Oral)   Ht _0  (1.727 m)   Wt 166 lb (75.3 kg)   SpO2 97%   BMI 25.24 kg/m  Wt Readings from Last 3 Encounters:  05/10/17 166 lb (75.3 kg)  03/18/17 165 lb 6.4 oz (75 kg)  02/07/17 163 lb (73.9 kg)   BP Readings from Last 3 Encounters:  05/10/17 138/88  03/18/17 (!) 150/68  02/07/17 (!) 144/66      Immunization History  Administered Date(s) Administered  . Pneumococcal Polysaccharide-23 06/07/2008  . Zoster 07/17/2013    Health Maintenance  Topic Date Due  . Samul Dada  07/25/1959  . PNA vac Low Risk Adult (2 of 2 - PCV13) 06/07/2009  . COLONOSCOPY  11/28/2015  . INFLUENZA VACCINE  08/05/2026 (Originally 12/05/2016)  . DEXA SCAN  Completed    Lab Results  Component Value Date   WBC 8.0 02/07/2017   HGB 14.2 02/07/2017   HCT 42.2 02/07/2017   PLT 334.0 02/07/2017   GLUCOSE 103 (H) 02/07/2017   CHOL 180 10/05/2016   TRIG 218 (H) 10/05/2016   HDL 46 (L) 10/05/2016   LDLDIRECT 106.0 01/02/2016   LDLCALC 90 10/05/2016   ALT 9 02/07/2017   AST 15 02/07/2017   NA 139 02/07/2017   K 4.1 02/07/2017   CL 100 02/07/2017   CREATININE 0.91 02/07/2017   BUN 16 02/07/2017   CO2 30 02/07/2017   TSH 1.77 02/07/2017   INR 1.04 07/23/2014   HGBA1C 6.1 07/03/2011    Lab Results  Component Value Date   TSH 1.77 02/07/2017   Lab Results  Component Value Date   WBC 8.0 02/07/2017   HGB 14.2 02/07/2017   HCT 42.2 02/07/2017   MCV 89.9 02/07/2017   PLT 334.0 02/07/2017   Lab Results  Component Value Date   NA 139 02/07/2017   K 4.1 02/07/2017   CHLORIDE 105 01/18/2015   CO2 30 02/07/2017   GLUCOSE 103 (H) 02/07/2017   BUN 16 02/07/2017   CREATININE 0.91 02/07/2017   BILITOT 0.6 02/07/2017   ALKPHOS 56 02/07/2017   AST 15 02/07/2017   ALT 9 02/07/2017   PROT 7.1 02/07/2017   ALBUMIN 4.3 02/07/2017   CALCIUM 10.1 02/07/2017   ANIONGAP 7 01/18/2015   EGFR 63 (L) 01/18/2015   GFR 63.79 02/07/2017   Lab Results  Component Value Date   CHOL 180 10/05/2016   Lab Results  Component  Value Date   HDL 46 (L) 10/05/2016   Lab Results  Component Value Date   LDLCALC 90 10/05/2016   Lab Results  Component Value Date   TRIG 218 (H) 10/05/2016   Lab Results  Component Value Date   CHOLHDL 3.9 10/05/2016   Lab Results  Component Value Date   HGBA1C 6.1  07/03/2011         Assessment & Plan:   Problem List Items Addressed This Visit      Unprioritized   Chronic pain syndrome   Relevant Medications   oxyCODONE-acetaminophen (ROXICET) 5-325 MG tablet   oxyCODONE-acetaminophen (ROXICET) 5-325 MG tablet   oxyCODONE-acetaminophen (ROXICET) 5-325 MG tablet   Essential hypertension    Well controlled, no changes to meds. Encouraged heart healthy diet such as the DASH diet and exercise as tolerated.       Relevant Orders   Lipid panel   CBC with Differential/Platelet   CBC with Differential/Platelet   Hyperlipidemia LDL goal <100    Tolerating statin, encouraged heart healthy diet, avoid trans fats, minimize simple carbs and saturated fats. Increase exercise as tolerated      Relevant Orders   Lipid panel   CBC with Differential/Platelet   CBC with Differential/Platelet   Insomnia    Stable with ambien      Relevant Medications   zolpidem (AMBIEN) 5 MG tablet    Other Visit Diagnoses    Urine frequency    -  Primary   Relevant Orders   POCT Urinalysis Dipstick (Completed)   Urine Culture   Frequent UTI       Relevant Orders   Ambulatory referral to Urology   Urine Culture      I have discontinued Kerrin Champagne. Orser's sulfamethoxazole-trimethoprim and ciprofloxacin. I am also having her maintain her Vitamin D3, b complex vitamins, Biotin, desvenlafaxine, Diclofenac Sodium, omeprazole, levocetirizine, fluticasone, azelastine, triamcinolone cream, fenofibrate, omega-3 acid ethyl esters, ibuprofen, diltiazem, metoprolol tartrate, quinapril, pravastatin, nitrofurantoin (macrocrystal-monohydrate), zolpidem, oxyCODONE-acetaminophen, oxyCODONE-acetaminophen, and oxyCODONE-acetaminophen.  Meds ordered this encounter  Medications  . DISCONTD: nitrofurantoin, macrocrystal-monohydrate, (MACROBID) 100 MG capsule    Sig: Take 1 capsule (100 mg total) by mouth 2 (two) times daily.    Dispense:  14 capsule    Refill:  0  . zolpidem  (AMBIEN) 5 MG tablet    Sig: Take 1 tablet (5 mg total) by mouth at bedtime as needed. for sleep    Dispense:  30 tablet    Refill:  3  . oxyCODONE-acetaminophen (ROXICET) 5-325 MG tablet    Sig: Take 1 tablet by mouth every 8 (eight) hours as needed for severe pain.    Dispense:  90 tablet    Refill:  0    Do not fill until march 2019  . oxyCODONE-acetaminophen (ROXICET) 5-325 MG tablet    Sig: Take 1 tablet by mouth every 8 (eight) hours as needed for severe pain.    Dispense:  90 tablet    Refill:  0    Do not fill until feb 2019  . oxyCODONE-acetaminophen (ROXICET) 5-325 MG tablet    Sig: Take 1-2 tablets by mouth every 4 (four) hours as needed.    Dispense:  90 tablet    Refill:  0    CMA served as scribe during this visit. History, Physical and Plan performed by medical provider. Documentation and orders reviewed and attested to.  Ann Held, DO

## 2017-05-10 NOTE — Patient Instructions (Signed)

## 2017-05-10 NOTE — Assessment & Plan Note (Signed)
Tolerating statin, encouraged heart healthy diet, avoid trans fats, minimize simple carbs and saturated fats. Increase exercise as tolerated 

## 2017-05-10 NOTE — Assessment & Plan Note (Signed)
Stable with [name]

## 2017-05-10 NOTE — Assessment & Plan Note (Signed)
Well controlled, no changes to meds. Encouraged heart healthy diet such as the DASH diet and exercise as tolerated.  °

## 2017-05-11 LAB — CBC WITH DIFFERENTIAL/PLATELET
BASOS ABS: 112 {cells}/uL (ref 0–200)
Basophils Relative: 1.1 %
Eosinophils Absolute: 184 cells/uL (ref 15–500)
Eosinophils Relative: 1.8 %
HEMATOCRIT: 40 % (ref 35.0–45.0)
Hemoglobin: 14 g/dL (ref 11.7–15.5)
LYMPHS ABS: 2305 {cells}/uL (ref 850–3900)
MCH: 30 pg (ref 27.0–33.0)
MCHC: 35 g/dL (ref 32.0–36.0)
MCV: 85.7 fL (ref 80.0–100.0)
MONOS PCT: 8.3 %
MPV: 10 fL (ref 7.5–12.5)
NEUTROS PCT: 66.2 %
Neutro Abs: 6752 cells/uL (ref 1500–7800)
PLATELETS: 333 10*3/uL (ref 140–400)
RBC: 4.67 10*6/uL (ref 3.80–5.10)
RDW: 13.2 % (ref 11.0–15.0)
TOTAL LYMPHOCYTE: 22.6 %
WBC mixed population: 847 cells/uL (ref 200–950)
WBC: 10.2 10*3/uL (ref 3.8–10.8)

## 2017-05-11 LAB — LIPID PANEL
CHOLESTEROL: 159 mg/dL (ref <200)
HDL: 48 mg/dL — AB (ref >50)
LDL Cholesterol (Calc): 85 mg/dL (calc)
Non-HDL Cholesterol (Calc): 111 mg/dL (calc) (ref <130)
Total CHOL/HDL Ratio: 3.3 (calc) (ref <5.0)
Triglycerides: 156 mg/dL — ABNORMAL HIGH (ref <150)

## 2017-05-12 LAB — URINE CULTURE
MICRO NUMBER: 90015063
SPECIMEN QUALITY: ADEQUATE

## 2017-05-16 ENCOUNTER — Other Ambulatory Visit: Payer: Self-pay | Admitting: Family Medicine

## 2017-05-16 DIAGNOSIS — M158 Other polyosteoarthritis: Secondary | ICD-10-CM

## 2017-06-17 DIAGNOSIS — N3000 Acute cystitis without hematuria: Secondary | ICD-10-CM | POA: Diagnosis not present

## 2017-06-27 ENCOUNTER — Ambulatory Visit: Payer: Medicare Other | Admitting: Family Medicine

## 2017-06-27 ENCOUNTER — Encounter: Payer: Self-pay | Admitting: Family Medicine

## 2017-06-27 ENCOUNTER — Ambulatory Visit (HOSPITAL_BASED_OUTPATIENT_CLINIC_OR_DEPARTMENT_OTHER)
Admission: RE | Admit: 2017-06-27 | Discharge: 2017-06-27 | Disposition: A | Discharge status: Home / Self Care | Payer: Medicare Other | Source: Ambulatory Visit | Attending: Family Medicine | Admitting: Family Medicine

## 2017-06-27 VITALS — BP 118/70 | HR 69 | Temp 97.9°F | Resp 16 | Ht 68.0 in | Wt 163.8 lb

## 2017-06-27 DIAGNOSIS — J984 Other disorders of lung: Secondary | ICD-10-CM | POA: Diagnosis not present

## 2017-06-27 DIAGNOSIS — R05 Cough: Secondary | ICD-10-CM | POA: Diagnosis not present

## 2017-06-27 DIAGNOSIS — J40 Bronchitis, not specified as acute or chronic: Secondary | ICD-10-CM

## 2017-06-27 DIAGNOSIS — R0602 Shortness of breath: Secondary | ICD-10-CM | POA: Diagnosis not present

## 2017-06-27 LAB — POCT INFLUENZA A/B
INFLUENZA A, POC: NEGATIVE
INFLUENZA B, POC: NEGATIVE

## 2017-06-27 MED ORDER — PROMETHAZINE-DM 6.25-15 MG/5ML PO SYRP: 5.0000 mL | ORAL_SOLUTION | Freq: Four times a day (QID) | ORAL | 0 refills | Status: DC | PRN

## 2017-06-27 MED ORDER — AZITHROMYCIN 250 MG PO TABS: ORAL_TABLET | ORAL | 0 refills | Status: DC

## 2017-06-27 MED ORDER — ALBUTEROL SULFATE (2.5 MG/3ML) 0.083% IN NEBU
2.5000 mg | INHALATION_SOLUTION | Freq: Once | RESPIRATORY_TRACT | Status: AC
  Administered 2017-06-27: 2.5 mg via RESPIRATORY_TRACT

## 2017-06-27 MED FILL — PROMETHAZINE-DM SYRUP: 6.25-15 | 6 days supply | Qty: 118 | Fill #0

## 2017-06-27 MED FILL — AZITHROMYCIN 250 MG TABLET: 250 | 5 days supply | Qty: 6 | Fill #0

## 2017-06-27 NOTE — Patient Instructions (Signed)

## 2017-06-27 NOTE — Progress Notes (Signed)
Patient ID: Amanda Joyce, female   DOB: 11-30-1940, 77 y.o.   MRN: [id number]    Subjective:  I acted as a Education administrator for Dr. Carollee Herter.  [name], [name]   Patient ID: Amanda Joyce, female    DOB: 03-Mar-1941, 77 y.o.   MRN: [id number]  Chief Complaint  Patient presents with  . Cough    Cough  This is a new problem. Episode onset: 8 days ago. The cough is non-productive. Associated symptoms include headaches and nasal congestion. Pertinent negatives include no chest pain, chills, ear congestion, ear pain, fever, postnasal drip, rash, sore throat, shortness of breath or wheezing. She has tried OTC cough suppressant (delsym,) for the symptoms.    Patient is in today for cough.   Patient Care Team: Carollee Herter, Alferd Apa, DO as PCP - General Tommy Medal, Lavell Islam, MD as Referring Physician (Infectious Diseases) Elsie Stain, MD as Consulting Physician (Pulmonary Disease)   Past Medical History:  Diagnosis Date  . Anxiety   . Arthritis    neck, shoulders, back, knees   . Breast cancer ([place])    Left   . Depression   . GERD (gastroesophageal reflux disease)   . Gout   . Hiatal hernia   . History of stress test >10 yrs. ago   in [place], [place] - no need for follow up  . Hyperlipemia   . Hypertension   . Irritable bowel syndrome   . Nontoxic uninodular goiter   . Osteoarthrosis, unspecified whether generalized or localized, unspecified site   . Personal history of malignant neoplasm of breast   . Temporomandibular joint disorders, unspecified     Past Surgical History:  Procedure Laterality Date  . ABDOMINAL HYSTERECTOMY    . APPENDECTOMY    . BREAST LUMPECTOMY     Left breast X2  . EYE SURGERY Bilateral    /w iol  . HERNIA REPAIR    . THYROID SURGERY    . TOTAL KNEE ARTHROPLASTY Right 08/04/2014   Procedure: RIGHT TOTAL KNEE ARTHROPLASTY;  Surgeon: Kathryne Hitch, MD;  Location: [facility];  Service: Orthopedics;  Laterality: Right;    Family History  Problem Relation Age  of Onset  . Coronary artery disease Father   . Liver cancer Paternal Uncle   . Alcohol abuse Paternal Uncle   . Other Mother        deceased from brain tumor  . Colon cancer Neg Hx     Social History   Socioeconomic History  . Marital status: Single    Spouse name: Not on file  . Number of children: 2  . Years of education: Not on file  . Highest education level: Not on file  Social Needs  . Financial resource strain: Not on file  . Food insecurity - worry: Not on file  . Food insecurity - inability: Not on file  . Transportation needs - medical: Not on file  . Transportation needs - non-medical: Not on file  Occupational History  . Occupation: Retired    Comment: Financial planner: RETIRED  Tobacco Use  . Smoking status: Never Smoker  . Smokeless tobacco: Never Used  Substance and Sexual Activity  . Alcohol use: No  . Drug use: No  . Sexual activity: Not on file  Other Topics Concern  . Not on file  Social History Narrative   2 caffeine drinks daily     Outpatient Medications Prior to Visit  Medication Sig Dispense Refill  .  azelastine (ASTELIN) 0.1 % nasal spray Place 1 spray into both nostrils 2 (two) times daily. Use in each nostril as directed 30 mL 12  . b complex vitamins capsule Take 1 capsule by mouth daily.    . Biotin 5000 MCG CAPS Take 5,000 mcg by mouth daily.    . Cholecalciferol (VITAMIN D3) 2000 UNITS capsule Take 4,000 Units by mouth daily.    Marland Kitchen desvenlafaxine (PRISTIQ) 100 MG 24 hr tablet Take 1 tablet (100 mg total) by mouth daily.    . Diclofenac Sodium (PENNSAID) 2 % SOLN Place onto the skin. Apply to knee daily as needed    . diltiazem (CARTIA XT) 240 MG 24 hr capsule TAKE 1 BY MOUTH DAILY 90 capsule 1  . fenofibrate 160 MG tablet Take 1 tablet (160 mg total) by mouth daily. 30 tablet 3  . fluticasone (FLONASE) 50 MCG/ACT nasal spray Place 1 spray into both nostrils 2 (two) times daily. 16 g 11  . ibuprofen (ADVIL,MOTRIN) 800 MG  tablet TAKE 1 TABLET BY MOUTH EVERY 8 HOURS AS NEEDED 90 tablet 0  . levocetirizine (XYZAL) 5 MG tablet Take 1 tablet (5 mg total) by mouth every evening. 30 tablet 5  . metoprolol tartrate (LOPRESSOR) 25 MG tablet Take 1 tablet (25 mg total) by mouth 2 (two) times daily. 180 tablet 1  . nitrofurantoin, macrocrystal-monohydrate, (MACROBID) 100 MG capsule Take 1 capsule (100 mg total) by mouth 2 (two) times daily. 14 capsule 0  . omega-3 acid ethyl esters (LOVAZA) 1 g capsule Take 2 capsules twice daily by mouth 120 capsule 2  . omeprazole (PRILOSEC) 40 MG capsule Take 1 capsule (40 mg total) by mouth daily. 30 capsule 2  . oxyCODONE-acetaminophen (ROXICET) 5-325 MG tablet Take 1 tablet by mouth every 8 (eight) hours as needed for severe pain. 90 tablet 0  . oxyCODONE-acetaminophen (ROXICET) 5-325 MG tablet Take 1 tablet by mouth every 8 (eight) hours as needed for severe pain. 90 tablet 0  . oxyCODONE-acetaminophen (ROXICET) 5-325 MG tablet Take 1-2 tablets by mouth every 4 (four) hours as needed. 90 tablet 0  . pravastatin (PRAVACHOL) 20 MG tablet TAKE 1 BY MOUTH DAILY 90 tablet 1  . quinapril (ACCUPRIL) 40 MG tablet TAKE 1 BY MOUTH DAILY 90 tablet 1  . triamcinolone cream (KENALOG) 0.1 % APPLY  CREAM TOPICALLY TWICE DAILY 30 g 5  . zolpidem (AMBIEN) 5 MG tablet Take 1 tablet (5 mg total) by mouth at bedtime as needed. for sleep 30 tablet 3   No facility-administered medications prior to visit.     Allergies  Allergen Reactions  . Clarithromycin     REACTION: NIGHTMARES  . Influenza Vac Split Quad Nausea And Vomiting  . Penicillins     REACTION: ITCHING  . Soma [Carisoprodol] Other (See Comments)    nightmares    Review of Systems  Constitutional: Negative for chills, fever and malaise/fatigue.  HENT: Negative for congestion, ear pain, postnasal drip and sore throat.   Eyes: Negative for blurred vision.  Respiratory: Positive for cough. Negative for shortness of breath and wheezing.    Cardiovascular: Negative for chest pain, palpitations and leg swelling.  Gastrointestinal: Negative for vomiting.  Musculoskeletal: Negative for back pain.  Skin: Negative for rash.  Neurological: Positive for headaches. Negative for loss of consciousness.       Objective:    Physical Exam  Constitutional: She is oriented to person, place, and time. She appears well-developed and well-nourished.  HENT:  Right Ear:  External ear normal.  Left Ear: External ear normal.  + PND + errythema  Eyes: Conjunctivae are normal. Right eye exhibits no discharge. Left eye exhibits no discharge.  Cardiovascular: Normal rate, regular rhythm and normal heart sounds.  No murmur heard. Pulmonary/Chest: Effort normal. No respiratory distress. She has no wheezes. She has rhonchi in the right upper field, the right middle field, the left upper field and the left middle field. She has no rales.     She exhibits no tenderness.  Musculoskeletal: She exhibits no edema.  Lymphadenopathy:    She has cervical adenopathy.  Neurological: She is alert and oriented to person, place, and time.  Nursing note and vitals reviewed.   BP 118/70 (BP Location: Right Arm, Cuff Size: Normal)   Pulse 69   Temp 97.9 F (36.6 C) (Oral)   Resp 16   Ht '5\' 8"'  (1.727 m)   Wt 163 lb 12.8 oz (74.3 kg)   SpO2 95%   BMI 24.91 kg/m  Wt Readings from Last 3 Encounters:  06/27/17 163 lb 12.8 oz (74.3 kg)  05/10/17 166 lb (75.3 kg)  03/18/17 165 lb 6.4 oz (75 kg)   BP Readings from Last 3 Encounters:  06/27/17 118/70  05/10/17 138/88  03/18/17 (!) 150/68     Immunization History  Administered Date(s) Administered  . Pneumococcal Polysaccharide-23 06/07/2008  . Zoster 07/17/2013    Health Maintenance  Topic Date Due  . Samul Dada  07/25/1959  . PNA vac Low Risk Adult (2 of 2 - PCV13) 06/07/2009  . COLONOSCOPY  11/28/2015  . INFLUENZA VACCINE  08/05/2026 (Originally 12/05/2016)  . DEXA SCAN  Completed    Lab  Results  Component Value Date   WBC 10.2 05/10/2017   HGB 14.0 05/10/2017   HCT 40.0 05/10/2017   PLT 333 05/10/2017   GLUCOSE 103 (H) 02/07/2017   CHOL 159 05/10/2017   TRIG 156 (H) 05/10/2017   HDL 48 (L) 05/10/2017   LDLDIRECT 106.0 01/02/2016   LDLCALC 90 10/05/2016   ALT 9 02/07/2017   AST 15 02/07/2017   NA 139 02/07/2017   K 4.1 02/07/2017   CL 100 02/07/2017   CREATININE 0.91 02/07/2017   BUN 16 02/07/2017   CO2 30 02/07/2017   TSH 1.77 02/07/2017   INR 1.04 07/23/2014   HGBA1C 6.1 07/03/2011    Lab Results  Component Value Date   TSH 1.77 02/07/2017   Lab Results  Component Value Date   WBC 10.2 05/10/2017   HGB 14.0 05/10/2017   HCT 40.0 05/10/2017   MCV 85.7 05/10/2017   PLT 333 05/10/2017   Lab Results  Component Value Date   NA 139 02/07/2017   K 4.1 02/07/2017   CHLORIDE 105 01/18/2015   CO2 30 02/07/2017   GLUCOSE 103 (H) 02/07/2017   BUN 16 02/07/2017   CREATININE 0.91 02/07/2017   BILITOT 0.6 02/07/2017   ALKPHOS 56 02/07/2017   AST 15 02/07/2017   ALT 9 02/07/2017   PROT 7.1 02/07/2017   ALBUMIN 4.3 02/07/2017   CALCIUM 10.1 02/07/2017   ANIONGAP 7 01/18/2015   EGFR 63 (L) 01/18/2015   GFR 63.79 02/07/2017   Lab Results  Component Value Date   CHOL 159 05/10/2017   Lab Results  Component Value Date   HDL 48 (L) 05/10/2017   Lab Results  Component Value Date   LDLCALC 90 10/05/2016   Lab Results  Component Value Date   TRIG 156 (H) 05/10/2017   Lab  Results  Component Value Date   CHOLHDL 3.3 05/10/2017   Lab Results  Component Value Date   HGBA1C 6.1 07/03/2011         Assessment & Plan:   Problem List Items Addressed This Visit    None    Visit Diagnoses    Bronchitis    -  Primary   Relevant Medications   azithromycin (ZITHROMAX Z-PAK) 250 MG tablet   promethazine-dextromethorphan (PROMETHAZINE-DM) 6.25-15 MG/5ML syrup   albuterol (PROVENTIL) (2.5 MG/3ML) 0.083% nebulizer solution 2.5 mg (Completed)    Other Relevant Orders   DG Chest 2 View   POCT Influenza A/B (Completed)      I am having Marney Doctor start on azithromycin and promethazine-dextromethorphan. I am also having her maintain her Vitamin D3, b complex vitamins, Biotin, desvenlafaxine, Diclofenac Sodium, omeprazole, levocetirizine, fluticasone, azelastine, triamcinolone cream, fenofibrate, omega-3 acid ethyl esters, diltiazem, metoprolol tartrate, quinapril, pravastatin, nitrofurantoin (macrocrystal-monohydrate), zolpidem, oxyCODONE-acetaminophen, oxyCODONE-acetaminophen, oxyCODONE-acetaminophen, and ibuprofen. We administered albuterol.  Meds ordered this encounter  Medications  . azithromycin (ZITHROMAX Z-PAK) 250 MG tablet    Sig: As directed    Dispense:  6 each    Refill:  0  . promethazine-dextromethorphan (PROMETHAZINE-DM) 6.25-15 MG/5ML syrup    Sig: Take 5 mLs by mouth 4 (four) times daily as needed.    Dispense:  118 mL    Refill:  0  . albuterol (PROVENTIL) (2.5 MG/3ML) 0.083% nebulizer solution 2.5 mg    CMA served as scribe during this visit. History, Physical and Plan performed by medical provider. Documentation and orders reviewed and attested to.  Ann Held, DO

## 2017-07-03 ENCOUNTER — Other Ambulatory Visit: Payer: Self-pay | Admitting: *Deleted

## 2017-07-03 DIAGNOSIS — E785 Hyperlipidemia, unspecified: Secondary | ICD-10-CM

## 2017-07-03 DIAGNOSIS — I10 Essential (primary) hypertension: Secondary | ICD-10-CM

## 2017-07-03 MED ORDER — QUINAPRIL HCL 40 MG PO TABS: ORAL_TABLET | ORAL | 1 refills | Status: DC

## 2017-07-03 MED ORDER — PRAVASTATIN SODIUM 20 MG PO TABS: ORAL_TABLET | ORAL | 1 refills | Status: DC

## 2017-07-03 MED ORDER — METOPROLOL TARTRATE 25 MG PO TABS: 25.0000 mg | ORAL_TABLET | Freq: Two times a day (BID) | ORAL | 1 refills | Status: DC

## 2017-07-03 MED ORDER — DILTIAZEM HCL ER COATED BEADS 240 MG PO CP24: ORAL_CAPSULE | ORAL | 1 refills | Status: DC

## 2017-07-03 NOTE — Addendum Note (Signed)
Addended by: Kem Boroughs D on: 07/03/2017 10:33 AM   Modules accepted: Orders

## 2017-07-19 ENCOUNTER — Telehealth: Payer: Self-pay | Admitting: Family Medicine

## 2017-07-19 NOTE — Telephone Encounter (Signed)
Copied from [facility]. Topic: Quick Communication - See Telephone Encounter >> Jul 19, 2017  2:39 PM Rosalin Hawking wrote: CRM for notification. See Telephone encounter for:  07/19/17.   Pt dropped off document to be filled out by provider ([employer] 3 pages with front and back) Pt would like document to be faxed [phone]. Document put at front office tray under providers name.

## 2017-07-31 NOTE — Telephone Encounter (Signed)
Completed as much as possible on form, enclosed copy of front/back insurance card as instructed; forwarded to provider/SLS 03/27

## 2017-08-08 ENCOUNTER — Encounter: Payer: Self-pay | Admitting: Family Medicine

## 2017-08-08 ENCOUNTER — Ambulatory Visit: Payer: Medicare Other | Admitting: Family Medicine

## 2017-08-08 VITALS — BP 118/80 | HR 76 | Resp 16 | Ht 68.0 in | Wt 164.0 lb

## 2017-08-08 DIAGNOSIS — Z23 Encounter for immunization: Secondary | ICD-10-CM

## 2017-08-08 DIAGNOSIS — G894 Chronic pain syndrome: Secondary | ICD-10-CM | POA: Diagnosis not present

## 2017-08-08 DIAGNOSIS — D229 Melanocytic nevi, unspecified: Secondary | ICD-10-CM | POA: Diagnosis not present

## 2017-08-08 DIAGNOSIS — Z79899 Other long term (current) drug therapy: Secondary | ICD-10-CM

## 2017-08-08 DIAGNOSIS — G4700 Insomnia, unspecified: Secondary | ICD-10-CM | POA: Diagnosis not present

## 2017-08-08 MED ORDER — OXYCODONE-ACETAMINOPHEN 5-325 MG PO TABS: 1.0000 | ORAL_TABLET | Freq: Three times a day (TID) | ORAL | 0 refills | Status: DC | PRN

## 2017-08-08 MED ORDER — ZOLPIDEM TARTRATE 5 MG PO TABS: 5.0000 mg | ORAL_TABLET | Freq: Every evening | ORAL | 3 refills | Status: DC | PRN

## 2017-08-08 MED ORDER — OXYCODONE-ACETAMINOPHEN 5-325 MG PO TABS: 1.0000 | ORAL_TABLET | ORAL | 0 refills | Status: DC | PRN

## 2017-08-08 NOTE — Progress Notes (Signed)
Patient ID: Amanda Joyce, female    DOB: 02-01-1941  Age: 77 y.o. MRN: [id number]    Subjective:  Subjective  HPI Amanda Joyce presents for f/u pain meds and ambien.  No complaints.   Review of Systems  Constitutional: Negative for activity change, appetite change, chills, fatigue, fever and unexpected weight change.  HENT: Negative for congestion and hearing loss.   Eyes: Negative for discharge.  Respiratory: Negative for cough and shortness of breath.   Cardiovascular: Negative for chest pain, palpitations and leg swelling.  Gastrointestinal: Negative for abdominal pain, blood in stool, constipation, diarrhea, nausea and vomiting.  Genitourinary: Negative for dysuria, frequency, hematuria and urgency.  Musculoskeletal: Negative for back pain and myalgias.  Skin: Negative for rash.  Allergic/Immunologic: Negative for environmental allergies.  Neurological: Negative for dizziness, weakness and headaches.  Hematological: Does not bruise/bleed easily.  Psychiatric/Behavioral: Negative for behavioral problems, dysphoric mood and suicidal ideas. The patient is not nervous/anxious.     History Past Medical History:  Diagnosis Date  . Anxiety   . Arthritis    neck, shoulders, back, knees   . Breast cancer ([facility])    Left   . Depression   . GERD (gastroesophageal reflux disease)   . Gout   . Hiatal hernia   . History of stress test >10 yrs. ago   in [place], [place] - no need for follow up  . Hyperlipemia   . Hypertension   . Irritable bowel syndrome   . Nontoxic uninodular goiter   . Osteoarthrosis, unspecified whether generalized or localized, unspecified site   . Personal history of malignant neoplasm of breast   . Temporomandibular joint disorders, unspecified     She has a past surgical history that includes Appendectomy; Hernia repair; Abdominal hysterectomy; Thyroid surgery; Breast lumpectomy; Eye surgery (Bilateral); and Total knee arthroplasty (Right, 08/04/2014).   Her  family history includes Alcohol abuse in her paternal uncle; Coronary artery disease in her father; Liver cancer in her paternal uncle; Other in her mother.She reports that she has never smoked. She has never used smokeless tobacco. She reports that she does not drink alcohol or use drugs.  Current Outpatient Medications on File Prior to Visit  Medication Sig Dispense Refill  . azelastine (ASTELIN) 0.1 % nasal spray Place 1 spray into both nostrils 2 (two) times daily. Use in each nostril as directed 30 mL 12  . azithromycin (ZITHROMAX Z-PAK) 250 MG tablet As directed 6 each 0  . b complex vitamins capsule Take 1 capsule by mouth daily.    . Biotin 5000 MCG CAPS Take 5,000 mcg by mouth daily.    . Cholecalciferol (VITAMIN D3) 2000 UNITS capsule Take 4,000 Units by mouth daily.    . Cranberry 500 MG CAPS Take by mouth.    . desvenlafaxine (PRISTIQ) 100 MG 24 hr tablet Take 1 tablet (100 mg total) by mouth daily.    . Diclofenac Sodium (PENNSAID) 2 % SOLN Place onto the skin. Apply to knee daily as needed    . diltiazem (CARTIA XT) 240 MG 24 hr capsule TAKE 1 BY MOUTH DAILY 90 capsule 1  . fenofibrate 160 MG tablet Take 1 tablet (160 mg total) by mouth daily. 30 tablet 3  . fluticasone (FLONASE) 50 MCG/ACT nasal spray Place 1 spray into both nostrils 2 (two) times daily. 16 g 11  . ibuprofen (ADVIL,MOTRIN) 800 MG tablet TAKE 1 TABLET BY MOUTH EVERY 8 HOURS AS NEEDED 90 tablet 0  . levocetirizine (XYZAL) 5  MG tablet Take 1 tablet (5 mg total) by mouth every evening. 30 tablet 5  . metoprolol tartrate (LOPRESSOR) 25 MG tablet Take 1 tablet (25 mg total) by mouth 2 (two) times daily. 180 tablet 1  . nitrofurantoin, macrocrystal-monohydrate, (MACROBID) 100 MG capsule Take 1 capsule (100 mg total) by mouth 2 (two) times daily. 14 capsule 0  . omega-3 acid ethyl esters (LOVAZA) 1 g capsule Take 2 capsules twice daily by mouth 120 capsule 2  . omeprazole (PRILOSEC) 40 MG capsule Take 1 capsule (40 mg  total) by mouth daily. 30 capsule 2  . pravastatin (PRAVACHOL) 20 MG tablet TAKE 1 BY MOUTH DAILY 90 tablet 1  . promethazine-dextromethorphan (PROMETHAZINE-DM) 6.25-15 MG/5ML syrup Take 5 mLs by mouth 4 (four) times daily as needed. 118 mL 0  . quinapril (ACCUPRIL) 40 MG tablet TAKE 1 BY MOUTH DAILY 90 tablet 1  . triamcinolone cream (KENALOG) 0.1 % APPLY  CREAM TOPICALLY TWICE DAILY 30 g 5  . UNABLE TO FIND Med Name: B100 vitamin     No current facility-administered medications on file prior to visit.      Objective:  Objective  Physical Exam  Constitutional: She is oriented to person, place, and time. She appears well-developed and well-nourished.  HENT:  Head: Normocephalic and atraumatic.  Eyes: Conjunctivae and EOM are normal.  Neck: Normal range of motion. Neck supple. No JVD present. Carotid bruit is not present. No thyromegaly present.  Cardiovascular: Normal rate, regular rhythm and normal heart sounds.  No murmur heard. Pulmonary/Chest: Effort normal and breath sounds normal. No respiratory distress. She has no wheezes. She has no rales. She exhibits no tenderness.  Musculoskeletal: She exhibits no edema.  Neurological: She is alert and oriented to person, place, and time.  Psychiatric: She has a normal mood and affect. Her behavior is normal. Judgment and thought content normal.  Nursing note and vitals reviewed.  BP 118/80 (BP Location: Right Arm, Patient Position: Sitting, Cuff Size: Normal)   Pulse 76   Resp 16   Ht 5\' 8"  (1.727 m)   Wt 164 lb (74.4 kg)   SpO2 98%   BMI 24.94 kg/m  Wt Readings from Last 3 Encounters:  08/08/17 164 lb (74.4 kg)  06/27/17 163 lb 12.8 oz (74.3 kg)  05/10/17 166 lb (75.3 kg)     Lab Results  Component Value Date   WBC 10.2 05/10/2017   HGB 14.0 05/10/2017   HCT 40.0 05/10/2017   PLT 333 05/10/2017   GLUCOSE 103 (H) 02/07/2017   CHOL 159 05/10/2017   TRIG 156 (H) 05/10/2017   HDL 48 (L) 05/10/2017   LDLDIRECT 106.0  01/02/2016   LDLCALC 85 05/10/2017   ALT 9 02/07/2017   AST 15 02/07/2017   NA 139 02/07/2017   K 4.1 02/07/2017   CL 100 02/07/2017   CREATININE 0.91 02/07/2017   BUN 16 02/07/2017   CO2 30 02/07/2017   TSH 1.77 02/07/2017   INR 1.04 07/23/2014   HGBA1C 6.1 07/03/2011    Dg Chest 2 View  Result Date: 06/27/2017 CLINICAL DATA:  Cough, shortness of breath. EXAM: CHEST  2 VIEW COMPARISON:  Radiographs of January 02, 2016 and February 04, 2014. FINDINGS: The heart size and mediastinal contours are within normal limits. No pneumothorax or pleural effusion is noted. Stable scarring is noted in lingular segment of left upper lobe. Stable bibasilar scarring is noted. No acute pulmonary abnormality is noted. The visualized skeletal structures are unremarkable. IMPRESSION: No active  cardiopulmonary disease.  Stable bibasilar scarring. Electronically Signed   By: Marijo Conception, M.D.   On: 06/27/2017 17:15     Assessment & Plan:  Plan  I am having Amanda Joyce maintain her Vitamin D3, b complex vitamins, Biotin, desvenlafaxine, Diclofenac Sodium, omeprazole, levocetirizine, fluticasone, azelastine, triamcinolone cream, fenofibrate, omega-3 acid ethyl esters, nitrofurantoin (macrocrystal-monohydrate), ibuprofen, azithromycin, promethazine-dextromethorphan, diltiazem, metoprolol tartrate, pravastatin, quinapril, Cranberry, UNABLE TO FIND, oxyCODONE-acetaminophen, oxyCODONE-acetaminophen, oxyCODONE-acetaminophen, and zolpidem.  Meds ordered this encounter  Medications  . oxyCODONE-acetaminophen (ROXICET) 5-325 MG tablet    Sig: Take 1 tablet by mouth every 8 (eight) hours as needed for severe pain.    Dispense:  90 tablet    Refill:  0    Do not fill until june 2019  . oxyCODONE-acetaminophen (ROXICET) 5-325 MG tablet    Sig: Take 1 tablet by mouth every 8 (eight) hours as needed for severe pain.    Dispense:  90 tablet    Refill:  0    Do not fill until May 2019  . oxyCODONE-acetaminophen  (ROXICET) 5-325 MG tablet    Sig: Take 1-2 tablets by mouth every 4 (four) hours as needed.    Dispense:  90 tablet    Refill:  0  . zolpidem (AMBIEN) 5 MG tablet    Sig: Take 1 tablet (5 mg total) by mouth at bedtime as needed. for sleep    Dispense:  30 tablet    Refill:  3    Problem List Items Addressed This Visit      Unprioritized   Chronic pain syndrome   Relevant Medications   oxyCODONE-acetaminophen (ROXICET) 5-325 MG tablet   oxyCODONE-acetaminophen (ROXICET) 5-325 MG tablet   oxyCODONE-acetaminophen (ROXICET) 5-325 MG tablet   Other Relevant Orders   Pain Mgmt, Profile 8 w/Conf, U   Insomnia   Relevant Medications   zolpidem (AMBIEN) 5 MG tablet   Other Relevant Orders   Pain Mgmt, Profile 8 w/Conf, U    Other Visit Diagnoses    Suspicious nevus    -  Primary   Relevant Orders   Ambulatory referral to Dermatology   High risk medication use       Relevant Orders   Pain Mgmt, Profile 8 w/Conf, U   Need for pneumococcal vaccination       Relevant Orders   Pneumococcal conjugate vaccine 13-valent IM (Completed)      Follow-up: Return in about 3 months (around 11/07/2017) for pain f/u.  Ann Held, DO

## 2017-08-08 NOTE — Patient Instructions (Signed)
Opioid Pain Medicine Information Opioids are powerful medicines that are used to treat moderate to severe pain. Opioids should be taken with the supervision of a trained health care provider. They should be taken for the shortest period of time as possible. This is because opioids can be addictive and the longer you take opioids, the greater your risk of addiction (opioid use disorder). What do opioids do? Opioids help to reduce or eliminate pain. When used for short periods of time, they can help you:  Sleep better.  Do better in physical or occupational therapy.  Feel better in the first few days after an injury.  Recover from surgery.  What is a pain treatment plan? A pain treatment plan is an agreement between you and your health care provider. Pain is unique to each person, and treatments vary depending on your condition. To manage your pain successfully, you and your health care provider need to understand each other and work together. To help you do this:  Discuss the goals of your treatment, including how much pain you might expect to have and how you will manage the pain.  Review the risks and benefits of taking opioid medicines for your condition.  Remember that a good treatment plan uses more than one approach and minimizes the chance of side effects.  Be honest about the amount of medicines you take, and about any drug or alcohol use.  Get pain medicine prescriptions from only one care provider.  Keep all follow-up visits as told by your health care provider. This is important.  What instructions should I follow while taking opioid pain medicine? While you are taking the medicine and for 8 hours after you stop taking the medicine, follow these instructions:  Do not drive.  Do not use machinery or power tools.  Do not sign legal documents.  Do not drink alcohol.  Do not take sleeping pills.  Do not supervise children by yourself.  Do not participate in activities  that require climbing or being in high places.  Do not enter a body of water-such as a lake, river, ocean, spa, or swimming pool-unless an adult is nearby who can monitor and help you.  What kinds of side effects can opioids cause? Opioids can cause side effects, such as:  Constipation.  Nausea.  Vomiting.  Drowsiness.  Confusion.  Opioid use disorder.  Breathing difficulties (respiratory depression).  Using opioid pain medicines for longer than 3 days increases your risk of these side effects. Taking opioid pain medicine for a long period of time can affect your ability to do daily tasks. It also puts you at risk for:  Motor vehicle accidents.  Depression.  Suicide.  Heart attack.  Overdose, which can sometimes lead to death.  What are alternative ways to manage pain? Pain can be managed with many types of alternative treatments. Ask your health care provider to refer you to one or more specialists who can help you manage pain through:  Physical or occupational therapy.  Counseling (cognitive behavioral therapy).  Good nutrition.  Biofeedback.  Massage.  Meditation.  Non-opioid medicine.  Following a gentle exercise program.  How can I keep others safe while I am taking opioid pain medicine?  Keep pain medicine in a locked cabinet, or in a secure area where children cannot reach it.  Never share your pain medicine with anyone.  Do not save any leftover pills. If you have leftover medicine, you can: 1. Bring the medicine to a prescription take-back program.   This is usually offered by the county or law enforcement. 2. Throw it out in the trash. To do this:  Mix the medicine with undesirable trash such as pet waste or food.  Put the mixture in a sealed container or plastic bag.  Throw it in the trash.  Destroy any personal information on the prescription bottle. How do I stop taking opioids if I have been taking them for a long time? If you have  been taking opioid medicine for more than a few weeks, you may need to slowly decrease (taper) how much you take until you stop completely. Tapering your use of opioids can decrease your chances of experiencing withdrawal symptoms, such as:  Pain and cramping in the abdomen.  Nausea.  Sweating.  Sleepiness.  Restlessness.  Uncontrollable shaking (tremors).  Cravings for the medicine.  Do not attempt to taper your use of opioids on your own. Talk with your health care provider about how to do this. Your health care provider may prescribe a step-down schedule based on how much medicine you are taking and how long you have been taking it. Where to find support: If you have been taking opioids for a long time, you may benefit from receiving support for quitting from a local support group or counselor. Ask your health care provider for a referral to these resources in your area. Where to find more information:  Centers for Disease Control and Prevention (CDC): www.cdc.gov/drugoverdose/opioids/index.html Get help right away if: Seek medical care right away if you are taking opioids and you (or people close to you) notice any of the following:  Difficulty breathing.  Breathing that is slower or more shallow than normal.  A very slow heartbeat (pulse).  Severe confusion.  Unconsciousness.  Sleepiness.  Slurred speech.  Nausea and vomiting.  Cold, clammy skin.  Blue lips or fingernails.  Limpness.  Abnormally small pupils.  If you think that you or someone else may have taken too much of an opioid medicine, get medical help right away. Do not wait to see if the symptoms go away on their own.  If you ever feel like you may hurt yourself or others, or have thoughts about taking your own life, get help right away. You can go to your nearest emergency department or call:  Your local emergency services (911 in the [place]  The hotline of the National Poison Control Center  (1-800-222-1222 in the [place]  A suicide crisis helpline, such as the National Suicide Prevention Lifeline at 1-800-273-8255. This is open 24 hours a day.  Summary  Opioid medicines can help you manage moderate to severe pain for a short period of time.  Discuss the goals of your treatment with your health care provider, including how much pain you might expect to have and how you will manage the pain.  A good treatment plan uses more than one approach. Pain can be managed with many types of alternative treatments.  If you think that you or someone else may have taken too much of an opioid, get medical help right away. This information is not intended to replace advice given to you by your health care provider. Make sure you discuss any questions you have with your health care provider. Document Released: 05/20/2015 Document Revised: 08/10/2016 Document Reviewed: 12/03/2014 Elsevier Interactive Patient Education  2018 Elsevier Inc.  

## 2017-08-14 LAB — PAIN MGMT, PROFILE 8 W/CONF, U
6 Acetylmorphine: NEGATIVE ng/mL (ref <10)
ALCOHOL METABOLITES: NEGATIVE ng/mL (ref <500)
AMPHETAMINES: NEGATIVE ng/mL (ref <500)
BENZODIAZEPINES: NEGATIVE ng/mL (ref <100)
BUPRENORPHINE, URINE: NEGATIVE ng/mL (ref <5)
COCAINE METABOLITE: NEGATIVE ng/mL (ref <150)
Codeine: NEGATIVE ng/mL (ref <50)
Creatinine: 129.7 mg/dL
Hydrocodone: NEGATIVE ng/mL (ref <50)
Hydromorphone: NEGATIVE ng/mL (ref <50)
MDMA: NEGATIVE ng/mL (ref <500)
Marijuana Metabolite: NEGATIVE ng/mL (ref <20)
Morphine: NEGATIVE ng/mL (ref <50)
NORHYDROCODONE: NEGATIVE ng/mL (ref <50)
NOROXYCODONE: 1435 ng/mL — AB (ref <50)
OXYCODONE: POSITIVE ng/mL — AB (ref <100)
OXYMORPHONE: 3363 ng/mL — AB (ref <50)
Opiates: NEGATIVE ng/mL (ref <100)
Oxidant: NEGATIVE ug/mL (ref <200)
Oxycodone: 1517 ng/mL — ABNORMAL HIGH (ref <50)
pH: 6.4 (ref 4.5–9.0)

## 2017-08-27 ENCOUNTER — Telehealth: Payer: Self-pay | Admitting: *Deleted

## 2017-08-27 NOTE — Telephone Encounter (Signed)
Received Approval/Acceptance in to the [facility]AP until 08/23/2018 for Pristiq [desvenlafaxine] #[id number]; patient ID #[id number]. To reorder reorder medicine for our patient throughout their enrollment, call [phone] and follow appropriate prompts for the medicine needed, or go to [email or web address] and place reorder via prescriber portal. Any questions, call [phone], Monday through Friday, from 8:00am to 6:00 pm ET. All information will be copied into the pt's Demographics under Clinical information tab/SLS 04/23

## 2017-08-29 NOTE — Telephone Encounter (Signed)
Left message with gentleman that her medication has arrived.   Medication place up front for patient to pickup.

## 2017-09-10 ENCOUNTER — Other Ambulatory Visit: Payer: Self-pay | Admitting: Family Medicine

## 2017-09-10 ENCOUNTER — Telehealth: Payer: Self-pay | Admitting: *Deleted

## 2017-09-10 DIAGNOSIS — G894 Chronic pain syndrome: Secondary | ICD-10-CM

## 2017-09-10 MED ORDER — OXYCODONE-ACETAMINOPHEN 5-325 MG PO TABS: 1.0000 | ORAL_TABLET | Freq: Three times a day (TID) | ORAL | 0 refills | Status: DC | PRN

## 2017-09-10 NOTE — Telephone Encounter (Signed)
[facility] faxed over stating that patients oxycodone rx was wrong.  You sent over 3 and one was different.  She usually takes it 1 tab every 8 hours as needed for severe pain.  Can you fix just this one rx?

## 2017-09-12 DIAGNOSIS — H938X1 Other specified disorders of right ear: Secondary | ICD-10-CM | POA: Diagnosis not present

## 2017-09-12 DIAGNOSIS — H9311 Tinnitus, right ear: Secondary | ICD-10-CM | POA: Diagnosis not present

## 2017-10-07 DIAGNOSIS — L820 Inflamed seborrheic keratosis: Secondary | ICD-10-CM | POA: Diagnosis not present

## 2017-10-07 DIAGNOSIS — L821 Other seborrheic keratosis: Secondary | ICD-10-CM | POA: Diagnosis not present

## 2017-10-11 ENCOUNTER — Other Ambulatory Visit: Payer: Self-pay | Admitting: Family Medicine

## 2017-10-11 DIAGNOSIS — L309 Dermatitis, unspecified: Secondary | ICD-10-CM

## 2017-11-01 ENCOUNTER — Ambulatory Visit: Payer: Medicare Other | Admitting: Family Medicine

## 2017-11-01 ENCOUNTER — Encounter: Payer: Self-pay | Admitting: Family Medicine

## 2017-11-01 VITALS — BP 130/80 | HR 66 | Temp 98.2°F | Resp 16 | Ht 68.0 in | Wt 161.0 lb

## 2017-11-01 DIAGNOSIS — I10 Essential (primary) hypertension: Secondary | ICD-10-CM

## 2017-11-01 DIAGNOSIS — E669 Obesity, unspecified: Secondary | ICD-10-CM

## 2017-11-01 DIAGNOSIS — F419 Anxiety disorder, unspecified: Secondary | ICD-10-CM | POA: Diagnosis not present

## 2017-11-01 DIAGNOSIS — G894 Chronic pain syndrome: Secondary | ICD-10-CM | POA: Diagnosis not present

## 2017-11-01 DIAGNOSIS — G4700 Insomnia, unspecified: Secondary | ICD-10-CM | POA: Diagnosis not present

## 2017-11-01 DIAGNOSIS — E66811 Obesity, class 1: Secondary | ICD-10-CM

## 2017-11-01 MED ORDER — OXYCODONE-ACETAMINOPHEN 5-325 MG PO TABS: 1.0000 | ORAL_TABLET | ORAL | 0 refills | Status: DC | PRN

## 2017-11-01 MED ORDER — OXYCODONE-ACETAMINOPHEN 5-325 MG PO TABS: 1.0000 | ORAL_TABLET | Freq: Three times a day (TID) | ORAL | 0 refills | Status: DC | PRN

## 2017-11-01 MED ORDER — ALPRAZOLAM 0.25 MG PO TABS: 0.2500 mg | ORAL_TABLET | Freq: Two times a day (BID) | ORAL | 0 refills | Status: DC | PRN

## 2017-11-01 MED ORDER — ZOLPIDEM TARTRATE 5 MG PO TABS: 5.0000 mg | ORAL_TABLET | Freq: Every evening | ORAL | 3 refills | Status: DC | PRN

## 2017-11-01 NOTE — Patient Instructions (Signed)
Blurred Vision Having blurred vision means that you cannot see things clearly. Your vision may seem fuzzy or out of focus. Blurred vision is a very common symptom of an eye or vision problem. Blurred vision is often a gradual blur that occurs in one eye or both eyes. There are many causes of blurred vision, including cataracts, macular degeneration, and diabetic retinopathy. Blurred vision can be diagnosed based on your symptoms and a physical exam. Tell your health care provider about any other health problems you have, any recent eye injury, and any prior surgeries. You may need to see a health care provider who specializes in eye problems (ophthalmologist). Your treatment depends on what is causing your blurred vision.  HOME CARE INSTRUCTIONS  Tell your health care provider about any changes in your blurred vision.  Do not drive or operate heavy machinery if your vision is blurry.  Keep all follow-up visits as directed by your health care provider. This is important. SEEK MEDICAL CARE IF:  Your symptoms get worse.  You have new symptoms.  You have trouble seeing at night.  You have trouble seeing up close or far away.  You have trouble noticing the difference between colors. SEEK IMMEDIATE MEDICAL CARE IF:  You have severe eye pain.  You have a severe headache.  You have flashing lights in your field of vision.  You have a sudden change in vision.  You have a sudden loss of vision.  You have vision change after an injury.  You notice drainage coming from your eyes.  You notice a rash around your eyes. This information is not intended to replace advice given to you by your health care provider. Make sure you discuss any questions you have with your health care provider. Document Released: 04/26/2003 Document Revised: 09/07/2014 Document Reviewed: 03/17/2014 Elsevier Interactive Patient Education  2017 Elsevier Inc.  

## 2017-11-01 NOTE — Progress Notes (Signed)
Patient ID: Amanda Joyce, female   DOB: 10-Mar-1941, 77 y.o.   MRN: [id number]     Subjective:  I acted as a Education administrator for Dr. Carollee Herter.  [name], [place]   Patient ID: Amanda Joyce, female    DOB: 1941-03-08, 77 y.o.   MRN: [id number]  Chief Complaint  Patient presents with  . chronic pain follow up    HPI  Patient is in today for follow up chronic pain.  She has been struggling with a lot of stress about flying and is requesting some xanax just for the flight.       Patient Care Team: Carollee Herter, Alferd Apa, DO as PCP - General Tommy Medal, Lavell Islam, MD as Referring Physician (Infectious Diseases) Elsie Stain, MD as Consulting Physician (Pulmonary Disease)   Past Medical History:  Diagnosis Date  . Anxiety   . Arthritis    neck, shoulders, back, knees   . Breast cancer ([facility])    Left   . Depression   . GERD (gastroesophageal reflux disease)   . Gout   . Hiatal hernia   . History of stress test >10 yrs. ago   in [place], [place] - no need for follow up  . Hyperlipemia   . Hypertension   . Irritable bowel syndrome   . Nontoxic uninodular goiter   . Osteoarthrosis, unspecified whether generalized or localized, unspecified site   . Personal history of malignant neoplasm of breast   . Temporomandibular joint disorders, unspecified     Past Surgical History:  Procedure Laterality Date  . ABDOMINAL HYSTERECTOMY    . APPENDECTOMY    . BREAST LUMPECTOMY     Left breast X2  . EYE SURGERY Bilateral    /w iol  . HERNIA REPAIR    . THYROID SURGERY    . 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 Coronary artery disease Father   . Liver cancer Paternal Uncle   . Alcohol abuse Paternal Uncle   . Other Mother        deceased from brain tumor  . Colon cancer Neg Hx     Social History    Socioeconomic History  . Marital status: Single    Spouse name: Not on file  . Number of children: 2  . Years of education: Not on file  . Highest education level: Not on file  Occupational History  . Occupation: Retired    Comment: Financial planner: RETIRED  Social Needs  . Financial resource strain: Not on file  . Food insecurity:    Worry: Not on file    Inability: Not on file  . Transportation needs:    Medical: Not on file    Non-medical: Not on file  Tobacco Use  . Smoking status: Never Smoker  . Smokeless tobacco: Never Used  Substance and Sexual Activity  . Alcohol use: No  . Drug use: No  . Sexual activity: Not on file  Lifestyle  . Physical activity:    Days per week: Not on file    Minutes per session: Not on file  . Stress: Not on file  Relationships  . Social connections:    Talks on phone: Not on file    Gets together: Not on file    Attends  religious service: Not on file    Active member of club or organization: Not on file    Attends meetings of clubs or organizations: Not on file    Relationship status: Not on file  . Intimate partner violence:    Fear of current or ex partner: Not on file    Emotionally abused: Not on file    Physically abused: Not on file    Forced sexual activity: Not on file  Other Topics Concern  . Not on file  Social History Narrative   2 caffeine drinks daily     Outpatient Medications Prior to Visit  Medication Sig Dispense Refill  . azelastine (ASTELIN) 0.1 % nasal spray Place 1 spray into both nostrils 2 (two) times daily. Use in each nostril as directed 30 mL 12  . b complex vitamins capsule Take 1 capsule by mouth daily.    . Biotin 5000 MCG CAPS Take 5,000 mcg by mouth daily.    . Cholecalciferol (VITAMIN D3) 2000 UNITS capsule Take 4,000 Units by mouth daily.    . Cranberry 500 MG CAPS Take by mouth.    . desvenlafaxine (PRISTIQ) 100 MG 24 hr tablet Take 1 tablet (100 mg total) by mouth daily.     . Diclofenac Sodium (PENNSAID) 2 % SOLN Place onto the skin. Apply to knee daily as needed    . diltiazem (CARTIA XT) 240 MG 24 hr capsule TAKE 1 BY MOUTH DAILY 90 capsule 1  . fenofibrate 160 MG tablet Take 1 tablet (160 mg total) by mouth daily. 30 tablet 3  . fluticasone (FLONASE) 50 MCG/ACT nasal spray Place 1 spray into both nostrils 2 (two) times daily. 16 g 11  . ibuprofen (ADVIL,MOTRIN) 800 MG tablet TAKE 1 TABLET BY MOUTH EVERY 8 HOURS AS NEEDED 90 tablet 0  . levocetirizine (XYZAL) 5 MG tablet Take 1 tablet (5 mg total) by mouth every evening. 30 tablet 5  . metoprolol tartrate (LOPRESSOR) 25 MG tablet Take 1 tablet (25 mg total) by mouth 2 (two) times daily. 180 tablet 1  . nitrofurantoin, macrocrystal-monohydrate, (MACROBID) 100 MG capsule Take 1 capsule (100 mg total) by mouth 2 (two) times daily. 14 capsule 0  . omega-3 acid ethyl esters (LOVAZA) 1 g capsule Take 2 capsules twice daily by mouth 120 capsule 2  . omeprazole (PRILOSEC) 40 MG capsule Take 1 capsule (40 mg total) by mouth daily. 30 capsule 2  . pravastatin (PRAVACHOL) 20 MG tablet TAKE 1 BY MOUTH DAILY 90 tablet 1  . promethazine-dextromethorphan (PROMETHAZINE-DM) 6.25-15 MG/5ML syrup Take 5 mLs by mouth 4 (four) times daily as needed. 118 mL 0  . quinapril (ACCUPRIL) 40 MG tablet TAKE 1 BY MOUTH DAILY 90 tablet 1  . triamcinolone cream (KENALOG) 0.1 % APPLY  CREAM EXTERNALLY TO AFFECTED AREA TWICE DAILY 30 g 1  . UNABLE TO FIND Med Name: B100 vitamin    . oxyCODONE-acetaminophen (ROXICET) 5-325 MG tablet Take 1 tablet by mouth every 8 (eight) hours as needed for severe pain. 90 tablet 0  . oxyCODONE-acetaminophen (ROXICET) 5-325 MG tablet Take 1 tablet by mouth every 8 (eight) hours as needed for severe pain. 90 tablet 0  . zolpidem (AMBIEN) 5 MG tablet Take 1 tablet (5 mg total) by mouth at bedtime as needed. for sleep 30 tablet 3  . azithromycin (ZITHROMAX Z-PAK) 250 MG tablet As directed 6 each 0   No  facility-administered medications prior to visit.     Allergies  Allergen Reactions  .  Clarithromycin     REACTION: NIGHTMARES  . Influenza Vac Split Quad Nausea And Vomiting  . Penicillins     REACTION: ITCHING  . Soma [Carisoprodol] Other (See Comments)    nightmares    Review of Systems  Constitutional: Negative for fever and malaise/fatigue.  HENT: Negative for congestion.   Eyes: Negative for blurred vision.  Respiratory: Negative for cough and shortness of breath.   Cardiovascular: Negative for chest pain, palpitations and leg swelling.  Gastrointestinal: Negative for vomiting.  Musculoskeletal: Negative for back pain.  Skin: Negative for rash.  Neurological: Negative for loss of consciousness and headaches.       Objective:    Physical Exam  Constitutional: She is oriented to person, place, and time. She appears well-developed and well-nourished.  HENT:  Head: Normocephalic and atraumatic.  Eyes: Conjunctivae and EOM are normal.  Neck: Normal range of motion. Neck supple. No JVD present. Carotid bruit is not present. No thyromegaly present.  Cardiovascular: Normal rate, regular rhythm and normal heart sounds.  No murmur heard. Pulmonary/Chest: Effort normal and breath sounds normal. No respiratory distress. She has no wheezes. She has no rales. She exhibits no tenderness.  Musculoskeletal: She exhibits no edema.  Neurological: She is alert and oriented to person, place, and time.  Psychiatric: She has a normal mood and affect.  Nursing note and vitals reviewed.   BP 130/80 (BP Location: Right Arm, Cuff Size: Normal)   Pulse 66   Temp 98.2 F (36.8 C) (Oral)   Resp 16   Ht '5\' 8"'  (1.727 m)   Wt 161 lb (73 kg)   SpO2 96%   BMI 24.48 kg/m  Wt Readings from Last 3 Encounters:  11/01/17 161 lb (73 kg)  08/08/17 164 lb (74.4 kg)  06/27/17 163 lb 12.8 oz (74.3 kg)   BP Readings from Last 3 Encounters:  11/01/17 130/80  08/08/17 118/80  06/27/17 118/70      Immunization History  Administered Date(s) Administered  . Pneumococcal Conjugate-13 08/08/2017  . Pneumococcal Polysaccharide-23 06/07/2008  . Zoster 07/17/2013    Health Maintenance  Topic Date Due  . Samul Dada  07/25/1959  . COLONOSCOPY  11/28/2015  . INFLUENZA VACCINE  08/05/2026 (Originally 12/05/2017)  . DEXA SCAN  Completed  . PNA vac Low Risk Adult  Completed    Lab Results  Component Value Date   WBC 10.2 05/10/2017   HGB 14.0 05/10/2017   HCT 40.0 05/10/2017   PLT 333 05/10/2017   GLUCOSE 103 (H) 02/07/2017   CHOL 159 05/10/2017   TRIG 156 (H) 05/10/2017   HDL 48 (L) 05/10/2017   LDLDIRECT 106.0 01/02/2016   LDLCALC 85 05/10/2017   ALT 9 02/07/2017   AST 15 02/07/2017   NA 139 02/07/2017   K 4.1 02/07/2017   CL 100 02/07/2017   CREATININE 0.91 02/07/2017   BUN 16 02/07/2017   CO2 30 02/07/2017   TSH 1.77 02/07/2017   INR 1.04 07/23/2014   HGBA1C 6.1 07/03/2011    Lab Results  Component Value Date   TSH 1.77 02/07/2017   Lab Results  Component Value Date   WBC 10.2 05/10/2017   HGB 14.0 05/10/2017   HCT 40.0 05/10/2017   MCV 85.7 05/10/2017   PLT 333 05/10/2017   Lab Results  Component Value Date   NA 139 02/07/2017   K 4.1 02/07/2017   CHLORIDE 105 01/18/2015   CO2 30 02/07/2017   GLUCOSE 103 (H) 02/07/2017   BUN 16 02/07/2017  CREATININE 0.91 02/07/2017   BILITOT 0.6 02/07/2017   ALKPHOS 56 02/07/2017   AST 15 02/07/2017   ALT 9 02/07/2017   PROT 7.1 02/07/2017   ALBUMIN 4.3 02/07/2017   CALCIUM 10.1 02/07/2017   ANIONGAP 7 01/18/2015   EGFR 63 (L) 01/18/2015   GFR 63.79 02/07/2017   Lab Results  Component Value Date   CHOL 159 05/10/2017   Lab Results  Component Value Date   HDL 48 (L) 05/10/2017   Lab Results  Component Value Date   LDLCALC 85 05/10/2017   Lab Results  Component Value Date   TRIG 156 (H) 05/10/2017   Lab Results  Component Value Date   CHOLHDL 3.3 05/10/2017   Lab Results  Component  Value Date   HGBA1C 6.1 07/03/2011         Assessment & Plan:   Problem List Items Addressed This Visit      Unprioritized   Chronic pain syndrome   Relevant Medications   oxyCODONE-acetaminophen (PERCOCET/ROXICET) 5-325 MG tablet   oxyCODONE-acetaminophen (ROXICET) 5-325 MG tablet   oxyCODONE-acetaminophen (ROXICET) 5-325 MG tablet   Essential hypertension    Well controlled, no changes to meds. Encouraged heart healthy diet such as the DASH diet and exercise as tolerated.       Insomnia   Relevant Medications   zolpidem (AMBIEN) 5 MG tablet    Other Visit Diagnoses    Anxiety    -  Primary   Relevant Medications   ALPRAZolam (XANAX) 0.25 MG tablet   Obesity (BMI 30.0-34.9)        pt in office >30 min and >50% face to face discussing he anxiety about flying and controlled substances  I have discontinued Pamala Hurry H. Marsan's azithromycin. I am also having her start on ALPRAZolam and oxyCODONE-acetaminophen. Additionally, I am having her maintain her Vitamin D3, b complex vitamins, Biotin, desvenlafaxine, Diclofenac Sodium, omeprazole, levocetirizine, fluticasone, azelastine, fenofibrate, omega-3 acid ethyl esters, nitrofurantoin (macrocrystal-monohydrate), ibuprofen, promethazine-dextromethorphan, diltiazem, metoprolol tartrate, pravastatin, quinapril, Cranberry, UNABLE TO FIND, triamcinolone cream, oxyCODONE-acetaminophen, oxyCODONE-acetaminophen, and zolpidem.  Meds ordered this encounter  Medications  . ALPRAZolam (XANAX) 0.25 MG tablet    Sig: Take 1 tablet (0.25 mg total) by mouth 2 (two) times daily as needed for anxiety.    Dispense:  20 tablet    Refill:  0  . oxyCODONE-acetaminophen (PERCOCET/ROXICET) 5-325 MG tablet    Sig: Take 1 tablet by mouth every 4 (four) hours as needed for severe pain.    Dispense:  90 tablet    Refill:  0    Do not fill until July 2019  . oxyCODONE-acetaminophen (ROXICET) 5-325 MG tablet    Sig: Take 1 tablet by mouth every 8 (eight)  hours as needed for severe pain.    Dispense:  90 tablet    Refill:  0    Do not fill until August 2019  . oxyCODONE-acetaminophen (ROXICET) 5-325 MG tablet    Sig: Take 1 tablet by mouth every 8 (eight) hours as needed for severe pain.    Dispense:  90 tablet    Refill:  0    Do not fill until sept 2019  . DISCONTD: zolpidem (AMBIEN) 5 MG tablet    Sig: Take 1 tablet (5 mg total) by mouth at bedtime as needed. for sleep    Dispense:  30 tablet    Refill:  3    teva brand  . zolpidem (AMBIEN) 5 MG tablet    Sig: Take 1 tablet (  5 mg total) by mouth at bedtime as needed. for sleep    Dispense:  30 tablet    Refill:  3    teva brand    CMA served as scribe during this visit. History, Physical and Plan performed by medical provider. Documentation and orders reviewed and attested to.  Ann Held, DO

## 2017-11-02 NOTE — Assessment & Plan Note (Signed)
Well controlled, no changes to meds. Encouraged heart healthy diet such as the DASH diet and exercise as tolerated.  °

## 2017-11-11 ENCOUNTER — Encounter: Payer: Self-pay | Admitting: Family Medicine

## 2017-11-11 ENCOUNTER — Ambulatory Visit (INDEPENDENT_AMBULATORY_CARE_PROVIDER_SITE_OTHER): Payer: Medicare Other | Admitting: Family Medicine

## 2017-11-11 ENCOUNTER — Encounter: Payer: Self-pay | Admitting: Neurology

## 2017-11-11 VITALS — BP 132/66 | HR 60 | Temp 98.2°F | Resp 16 | Ht 68.0 in | Wt 161.2 lb

## 2017-11-11 DIAGNOSIS — E785 Hyperlipidemia, unspecified: Secondary | ICD-10-CM

## 2017-11-11 DIAGNOSIS — Z0000 Encounter for general adult medical examination without abnormal findings: Secondary | ICD-10-CM

## 2017-11-11 DIAGNOSIS — G43109 Migraine with aura, not intractable, without status migrainosus: Secondary | ICD-10-CM

## 2017-11-11 DIAGNOSIS — I10 Essential (primary) hypertension: Secondary | ICD-10-CM | POA: Diagnosis not present

## 2017-11-11 DIAGNOSIS — R8281 Pyuria: Secondary | ICD-10-CM

## 2017-11-11 DIAGNOSIS — N390 Urinary tract infection, site not specified: Secondary | ICD-10-CM | POA: Diagnosis not present

## 2017-11-11 LAB — COMPREHENSIVE METABOLIC PANEL
ALBUMIN: 4.5 g/dL (ref 3.5–5.2)
ALK PHOS: 54 U/L (ref 39–117)
ALT: 11 U/L (ref 0–35)
AST: 18 U/L (ref 0–37)
BUN: 19 mg/dL (ref 6–23)
CHLORIDE: 101 meq/L (ref 96–112)
CO2: 31 mEq/L (ref 19–32)
CREATININE: 0.86 mg/dL (ref 0.40–1.20)
Calcium: 10.4 mg/dL (ref 8.4–10.5)
GFR: 67.95 mL/min (ref >60.00)
Glucose, Bld: 112 mg/dL — ABNORMAL HIGH (ref 70–99)
POTASSIUM: 4.1 meq/L (ref 3.5–5.1)
Sodium: 139 mEq/L (ref 135–145)
Total Bilirubin: 0.8 mg/dL (ref 0.2–1.2)
Total Protein: 6.8 g/dL (ref 6.0–8.3)

## 2017-11-11 LAB — CBC WITH DIFFERENTIAL/PLATELET
Basophils Absolute: 0.1 10*3/uL (ref 0.0–0.1)
Basophils Relative: 1.2 % (ref 0.0–3.0)
Eosinophils Absolute: 0.2 10*3/uL (ref 0.0–0.7)
Eosinophils Relative: 2.7 % (ref 0.0–5.0)
HCT: 42.2 % (ref 36.0–46.0)
HEMOGLOBIN: 14.2 g/dL (ref 12.0–15.0)
LYMPHS ABS: 2.4 10*3/uL (ref 0.7–4.0)
Lymphocytes Relative: 29.4 % (ref 12.0–46.0)
MCHC: 33.7 g/dL (ref 30.0–36.0)
MCV: 88.7 fl (ref 78.0–100.0)
MONO ABS: 0.6 10*3/uL (ref 0.1–1.0)
MONOS PCT: 7.1 % (ref 3.0–12.0)
NEUTROS PCT: 59.6 % (ref 43.0–77.0)
Neutro Abs: 4.9 10*3/uL (ref 1.4–7.7)
Platelets: 341 10*3/uL (ref 150.0–400.0)
RBC: 4.76 Mil/uL (ref 3.87–5.11)
RDW: 13.5 % (ref 11.5–15.5)
WBC: 8.3 10*3/uL (ref 4.0–10.5)

## 2017-11-11 LAB — LIPID PANEL
CHOLESTEROL: 169 mg/dL (ref 0–200)
HDL: 48.1 mg/dL (ref >39.00)
LDL Cholesterol: 87 mg/dL (ref 0–99)
NonHDL: 120.66
Total CHOL/HDL Ratio: 4
Triglycerides: 170 mg/dL — ABNORMAL HIGH (ref 0.0–149.0)
VLDL: 34 mg/dL (ref 0.0–40.0)

## 2017-11-11 LAB — POC URINALSYSI DIPSTICK (AUTOMATED)
Bilirubin, UA: NEGATIVE
Glucose, UA: NEGATIVE
Ketones, UA: NEGATIVE
NITRITE UA: POSITIVE
PROTEIN UA: POSITIVE — AB
Spec Grav, UA: 1.02 (ref 1.010–1.025)
Urobilinogen, UA: 0.2 E.U./dL
pH, UA: 6 (ref 5.0–8.0)

## 2017-11-11 NOTE — Patient Instructions (Signed)
Preventive Care 77 Years and Older, Female Preventive care refers to lifestyle choices and visits with your health care provider that can promote health and wellness. What does preventive care include?  A yearly physical exam. This is also called an annual well check.  Dental exams once or twice a year.  Routine eye exams. Ask your health care provider how often you should have your eyes checked.  Personal lifestyle choices, including: ? Daily care of your teeth and gums. ? Regular physical activity. ? Eating a healthy diet. ? Avoiding tobacco and drug use. ? Limiting alcohol use. ? Practicing safe sex. ? Taking low-dose aspirin every day. ? Taking vitamin and mineral supplements as recommended by your health care provider. What happens during an annual well check? The services and screenings done by your health care provider during your annual well check will depend on your age, overall health, lifestyle risk factors, and family history of disease. Counseling Your health care provider may ask you questions about your:  Alcohol use.  Tobacco use.  Drug use.  Emotional well-being.  Home and relationship well-being.  Sexual activity.  Eating habits.  History of falls.  Memory and ability to understand (cognition).  Work and work environment.  Reproductive health.  Screening You may have the following tests or measurements:  Height, weight, and BMI.  Blood pressure.  Lipid and cholesterol levels. These may be checked every 5 years, or more frequently if you are over 50 years old.  Skin check.  Lung cancer screening. You may have this screening every year starting at age 55 if you have a 30-pack-year history of smoking and currently smoke or have quit within the past 15 years.  Fecal occult blood test (FOBT) of the stool. You may have this test every year starting at age 50.  Flexible sigmoidoscopy or colonoscopy. You may have a sigmoidoscopy every 5 years or  a colonoscopy every 10 years starting at age 50.  Hepatitis C blood test.  Hepatitis B blood test.  Sexually transmitted disease (STD) testing.  Diabetes screening. This is done by checking your blood sugar (glucose) after you have not eaten for a while (fasting). You may have this done every 1-3 years.  Bone density scan. This is done to screen for osteoporosis. You may have this done starting at age 77.  Mammogram. This may be done every 1-2 years. Talk to your health care provider about how often you should have regular mammograms.  Talk with your health care provider about your test results, treatment options, and if necessary, the need for more tests. Vaccines Your health care provider may recommend certain vaccines, such as:  Influenza vaccine. This is recommended every year.  Tetanus, diphtheria, and acellular pertussis (Tdap, Td) vaccine. You may need a Td booster every 10 years.  Varicella vaccine. You may need this if you have not been vaccinated.  Zoster vaccine. You may need this after age 60.  Measles, mumps, and rubella (MMR) vaccine. You may need at least one dose of MMR if you were born in 1957 or later. You may also need a second dose.  Pneumococcal 13-valent conjugate (PCV13) vaccine. One dose is recommended after age 77.  Pneumococcal polysaccharide (PPSV23) vaccine. One dose is recommended after age 77.  Meningococcal vaccine. You may need this if you have certain conditions.  Hepatitis A vaccine. You may need this if you have certain conditions or if you travel or work in places where you may be exposed to hepatitis   A.  Hepatitis B vaccine. You may need this if you have certain conditions or if you travel or work in places where you may be exposed to hepatitis B.  Haemophilus influenzae type b (Hib) vaccine. You may need this if you have certain conditions.  Talk to your health care provider about which screenings and vaccines you need and how often you  need them. This information is not intended to replace advice given to you by your health care provider. Make sure you discuss any questions you have with your health care provider. Document Released: 05/20/2015 Document Revised: 01/11/2016 Document Reviewed: 02/22/2015 Elsevier Interactive Patient Education  2018 [employer] Inc[employer]  

## 2017-11-11 NOTE — Progress Notes (Signed)
Subjective:     Amanda Joyce is a 77 y.o. female and is here for a comprehensive physical exam. The patient reports increasing frequency of ocular migraines --- last one was a few days ago--  She says it seems like she is looking through broken glass.  No congestion ,  She wonders if the weather is affecting it.  She is getting ready to go to [place].    Social History   Socioeconomic History  . Marital status: Single    Spouse name: Not on file  . Number of children: 2  . Years of education: Not on file  . Highest education level: Not on file  Occupational History  . Occupation: Retired    Comment: Financial planner: RETIRED  Social Needs  . Financial resource strain: Not on file  . Food insecurity:    Worry: Not on file    Inability: Not on file  . Transportation needs:    Medical: Not on file    Non-medical: Not on file  Tobacco Use  . Smoking status: Never Smoker  . Smokeless tobacco: Never Used  Substance and Sexual Activity  . Alcohol use: No  . Drug use: No  . Sexual activity: Not Currently  Lifestyle  . Physical activity:    Days per week: Not on file    Minutes per session: Not on file  . Stress: Not on file  Relationships  . Social connections:    Talks on phone: Not on file    Gets together: Not on file    Attends religious service: Not on file    Active member of club or organization: Not on file    Attends meetings of clubs or organizations: Not on file    Relationship status: Not on file  . Intimate partner violence:    Fear of current or ex partner: Not on file    Emotionally abused: Not on file    Physically abused: Not on file    Forced sexual activity: Not on file  Other Topics Concern  . Not on file  Social History Narrative   2 caffeine drinks daily    Health Maintenance  Topic Date Due  . TETANUS/TDAP  07/25/1959  . COLONOSCOPY  11/12/2018 (Originally 11/28/2015)  . INFLUENZA VACCINE  08/05/2026 (Originally 12/05/2017)  . DEXA  SCAN  Completed  . PNA vac Low Risk Adult  Completed    The following portions of the patient's history were reviewed and updated as appropriate:  She  has a past medical history of Anxiety, Arthritis, Breast cancer ([place]), Depression, GERD (gastroesophageal reflux disease), Gout, Hiatal hernia, History of stress test (>10 yrs. ago), Hyperlipemia, Hypertension, Irritable bowel syndrome, Nontoxic uninodular goiter, Osteoarthrosis, unspecified whether generalized or localized, unspecified site, Personal history of malignant neoplasm of breast, and Temporomandibular joint disorders, unspecified. She does not have any pertinent problems on file. She  has a past surgical history that includes Appendectomy; Hernia repair; Abdominal hysterectomy; Thyroid surgery; Breast lumpectomy; Eye surgery (Bilateral); and Total knee arthroplasty (Right, 08/04/2014). Her family history includes Alcohol abuse in her paternal uncle; Coronary artery disease in her father; Liver cancer in her paternal uncle; Other in her mother. She  reports that she has never smoked. She has never used smokeless tobacco. She reports that she does not drink alcohol or use drugs. She has a current medication list which includes the following prescription(s): alprazolam, azelastine, b complex vitamins, biotin, vitamin d3, cranberry, desvenlafaxine, diclofenac sodium,  diltiazem, fenofibrate, fluticasone, ibuprofen, levocetirizine, metoprolol tartrate, nitrofurantoin (macrocrystal-monohydrate), omega-3 acid ethyl esters, omeprazole, oxycodone-acetaminophen, oxycodone-acetaminophen, oxycodone-acetaminophen, pravastatin, quinapril, triamcinolone cream, UNABLE TO FIND, and zolpidem. Current Outpatient Medications on File Prior to Visit  Medication Sig Dispense Refill  . ALPRAZolam (XANAX) 0.25 MG tablet Take 1 tablet (0.25 mg total) by mouth 2 (two) times daily as needed for anxiety. 20 tablet 0  . azelastine (ASTELIN) 0.1 % nasal spray Place 1 spray  into both nostrils 2 (two) times daily. Use in each nostril as directed 30 mL 12  . b complex vitamins capsule Take 1 capsule by mouth daily.    . Biotin 5000 MCG CAPS Take 5,000 mcg by mouth daily.    . Cholecalciferol (VITAMIN D3) 2000 UNITS capsule Take 4,000 Units by mouth daily.    . Cranberry 500 MG CAPS Take by mouth.    . desvenlafaxine (PRISTIQ) 100 MG 24 hr tablet Take 1 tablet (100 mg total) by mouth daily.    . Diclofenac Sodium (PENNSAID) 2 % SOLN Place onto the skin. Apply to knee daily as needed    . diltiazem (CARTIA XT) 240 MG 24 hr capsule TAKE 1 BY MOUTH DAILY 90 capsule 1  . fenofibrate 160 MG tablet Take 1 tablet (160 mg total) by mouth daily. 30 tablet 3  . fluticasone (FLONASE) 50 MCG/ACT nasal spray Place 1 spray into both nostrils 2 (two) times daily. 16 g 11  . ibuprofen (ADVIL,MOTRIN) 800 MG tablet TAKE 1 TABLET BY MOUTH EVERY 8 HOURS AS NEEDED 90 tablet 0  . levocetirizine (XYZAL) 5 MG tablet Take 1 tablet (5 mg total) by mouth every evening. 30 tablet 5  . metoprolol tartrate (LOPRESSOR) 25 MG tablet Take 1 tablet (25 mg total) by mouth 2 (two) times daily. 180 tablet 1  . nitrofurantoin, macrocrystal-monohydrate, (MACROBID) 100 MG capsule Take 1 capsule (100 mg total) by mouth 2 (two) times daily. 14 capsule 0  . omega-3 acid ethyl esters (LOVAZA) 1 g capsule Take 2 capsules twice daily by mouth 120 capsule 2  . omeprazole (PRILOSEC) 40 MG capsule Take 1 capsule (40 mg total) by mouth daily. 30 capsule 2  . oxyCODONE-acetaminophen (PERCOCET/ROXICET) 5-325 MG tablet Take 1 tablet by mouth every 4 (four) hours as needed for severe pain. 90 tablet 0  . oxyCODONE-acetaminophen (ROXICET) 5-325 MG tablet Take 1 tablet by mouth every 8 (eight) hours as needed for severe pain. 90 tablet 0  . oxyCODONE-acetaminophen (ROXICET) 5-325 MG tablet Take 1 tablet by mouth every 8 (eight) hours as needed for severe pain. 90 tablet 0  . pravastatin (PRAVACHOL) 20 MG tablet TAKE 1 BY  MOUTH DAILY 90 tablet 1  . quinapril (ACCUPRIL) 40 MG tablet TAKE 1 BY MOUTH DAILY 90 tablet 1  . triamcinolone cream (KENALOG) 0.1 % APPLY  CREAM EXTERNALLY TO AFFECTED AREA TWICE DAILY 30 g 1  . UNABLE TO FIND Med Name: B100 vitamin    . zolpidem (AMBIEN) 5 MG tablet Take 1 tablet (5 mg total) by mouth at bedtime as needed. for sleep 30 tablet 3   No current facility-administered medications on file prior to visit.    She is allergic to clarithromycin; influenza vac split quad; penicillins; and soma [carisoprodol]..  Review of Systems Review of Systems  Constitutional: Negative for activity change, appetite change and fatigue.  HENT: positive for hearing loss, , tinnitus .  dentist q68m Eyes: Negative for visual disturbance (see optho q1y -- vision corrected to 20/20 with glasses).  Respiratory:  Negative for cough, chest tightness and shortness of breath.   Cardiovascular: Negative for chest pain, palpitations and leg swelling.  Gastrointestinal: Negative for abdominal pain, diarrhea, constipation and abdominal distention.  Genitourinary: Negative for urgency, frequency, decreased urine volume and difficulty urinating.  Musculoskeletal: Negative for back pain, arthralgias and gait problem.  Skin: Negative for color change, pallor and rash.  Neurological: Negative for dizziness, light-headedness, numbness and headaches.  Hematological: Negative for adenopathy. Does not bruise/bleed easily.  Psychiatric/Behavioral: Negative for suicidal ideas, confusion, sleep disturbance, self-injury, dysphoric mood, decreased concentration and agitation.       Objective:    BP 132/66 (BP Location: Right Arm, Cuff Size: Normal)   Pulse 60   Temp 98.2 F (36.8 C) (Oral)   Resp 16   Ht 5\' 8"  (1.727 m)   Wt 161 lb 3.2 oz (73.1 kg)   SpO2 95%   BMI 24.51 kg/m  General appearance: alert, cooperative, appears stated age and no distress Head: Normocephalic, without obvious abnormality,  atraumatic Eyes: negative findings: lids and lashes normal and pupils equal, round, reactive to light and accomodation Ears: normal TM's and external ear canals both ears Nose: Nares normal. Septum midline. Mucosa normal. No drainage or sinus tenderness. Throat: lips, mucosa, and tongue normal; teeth and gums normal Neck: no adenopathy, no carotid bruit, no JVD, supple, symmetrical, trachea midline and thyroid not enlarged, symmetric, no tenderness/mass/nodules Back: symmetric, no curvature. ROM normal. No CVA tenderness. Lungs: clear to auscultation bilaterally Breasts: deferred-- pt preference Heart: regular rate and rhythm, S1, S2 normal, no murmur, click, rub or gallop Abdomen: soft, non-tender; bowel sounds normal; no masses,  no organomegaly Pelvic: not indicated; status post hysterectomy, negative ROS Extremities: extremities normal, atraumatic, no cyanosis or edema Pulses: 2+ and symmetric Skin: Skin color, texture, turgor normal. No rashes or lesions Lymph nodes: Cervical, supraclavicular, and axillary nodes normal. Neurologic: Alert and oriented X 3, normal strength and tone. Normal symmetric reflexes. Normal coordination and gait    Assessment:    Healthy female exam.     Plan:    ghm utd Check labs See After Visit Summary for Counseling Recommendations    1. Essential hypertension Well controlled, no changes to meds. Encouraged heart healthy diet such as the DASH diet and exercise as tolerated.  - Comprehensive metabolic panel - CBC with Differential/Platelet - Lipid panel - POCT Urinalysis Dipstick (Automated)  2. Hyperlipidemia LDL goal <100 Tolerating statin, encouraged heart healthy diet, avoid trans fats, minimize simple carbs and saturated fats. Increase exercise as tolerated - Comprehensive metabolic panel - CBC with Differential/Platelet - Lipid panel - POCT Urinalysis Dipstick (Automated)  3. Preventative health care See above - Comprehensive metabolic  panel - CBC with Differential/Platelet - Lipid panel - POCT Urinalysis Dipstick (Automated)  4. Ocular migraine   - MR Brain Wo Contrast; Future - Ambulatory referral to Neurology  5. Pyuria   - Urine Culture

## 2017-11-14 LAB — URINE CULTURE
MICRO NUMBER: 90805000
SPECIMEN QUALITY:: ADEQUATE

## 2017-11-15 ENCOUNTER — Ambulatory Visit
Admission: RE | Admit: 2017-11-15 | Discharge: 2017-11-15 | Disposition: A | Discharge status: Home / Self Care | Payer: Medicare Other | Source: Ambulatory Visit | Attending: Family Medicine | Admitting: Family Medicine

## 2017-11-15 DIAGNOSIS — G43109 Migraine with aura, not intractable, without status migrainosus: Secondary | ICD-10-CM

## 2017-11-15 DIAGNOSIS — H539 Unspecified visual disturbance: Secondary | ICD-10-CM | POA: Diagnosis not present

## 2017-11-19 ENCOUNTER — Telehealth: Payer: Self-pay | Admitting: *Deleted

## 2017-11-19 NOTE — Telephone Encounter (Signed)
See staff message  Nothing acute----  Old stroke Refer to neuro

## 2017-11-19 NOTE — Telephone Encounter (Signed)
Copied from [place] [id number]. Topic: Quick Communication - Other Results >> Nov 18, 2017 12:47 PM Synthia Innocent wrote: Requesting MRI results

## 2017-11-20 ENCOUNTER — Telehealth: Payer: Self-pay | Admitting: *Deleted

## 2017-11-20 ENCOUNTER — Other Ambulatory Visit: Payer: Self-pay | Admitting: *Deleted

## 2017-11-20 DIAGNOSIS — N390 Urinary tract infection, site not specified: Secondary | ICD-10-CM

## 2017-11-20 MED ORDER — FENOFIBRATE 160 MG PO TABS: 160.0000 mg | ORAL_TABLET | Freq: Every day | ORAL | 3 refills | Status: AC

## 2017-11-20 MED ORDER — AMOXICILLIN-POT CLAVULANATE 875-125 MG PO TABS: 1.0000 | ORAL_TABLET | Freq: Two times a day (BID) | ORAL | 0 refills | Status: DC

## 2017-11-20 MED ORDER — CIPROFLOXACIN HCL 500 MG PO TABS: 500.0000 mg | ORAL_TABLET | Freq: Two times a day (BID) | ORAL | 10 refills | Status: DC

## 2017-11-20 NOTE — Telephone Encounter (Signed)
Pharmacy called about augmentin.  Patient has an allergy to penicillin. Do you want to change to something else?

## 2017-11-20 NOTE — Telephone Encounter (Signed)
cipro 500 mg bid x5 days

## 2017-11-20 NOTE — Telephone Encounter (Signed)
Patient notified and medication sent in. 

## 2017-11-20 NOTE — Telephone Encounter (Signed)
Patient notified of results.

## 2017-11-20 NOTE — Telephone Encounter (Signed)
-----   Message from Ann Held, DO sent at 11/15/2017 10:29 PM EDT ----- Mri brain-- no acute changes Old stroke Refer to neuro if migraines con't

## 2017-11-26 ENCOUNTER — Encounter: Payer: Self-pay | Admitting: Family Medicine

## 2017-11-26 DIAGNOSIS — Z1212 Encounter for screening for malignant neoplasm of rectum: Secondary | ICD-10-CM | POA: Diagnosis not present

## 2017-11-26 DIAGNOSIS — Z1211 Encounter for screening for malignant neoplasm of colon: Secondary | ICD-10-CM | POA: Diagnosis not present

## 2017-11-26 LAB — COLOGUARD: Cologuard: NEGATIVE

## 2017-11-26 NOTE — Telephone Encounter (Signed)
Pt called to notify that her prescription for Cipro was wrong. Pt stated that she was given a bottle with 5 pills in it with 11 refills. Per Dr Etter Sjogren Chase's note it was ordered Cipro 500 mg twice daily for 5 days. Pt took the first 5 pills which gave her 2.5 days of antibiotic. She went to pharmacy and got it refilled so she could complete therapy. Pt very concerned and upset about this happening. Advised pt to take 1 pill this evening and to continue twice a dosing for 2 more days to complete therapy.

## 2017-11-27 ENCOUNTER — Telehealth: Payer: Self-pay | Admitting: *Deleted

## 2017-11-27 MED ORDER — CIPROFLOXACIN HCL 500 MG PO TABS: 500.0000 mg | ORAL_TABLET | Freq: Two times a day (BID) | ORAL | 0 refills | Status: DC

## 2017-11-27 NOTE — Telephone Encounter (Signed)
This encounter was created in error - please disregard.

## 2017-11-27 NOTE — Addendum Note (Signed)
Addended by: Kem Boroughs D on: 11/27/2017 11:17 AM   Modules accepted: Orders

## 2017-11-27 NOTE — Telephone Encounter (Signed)
error 

## 2017-11-27 NOTE — Telephone Encounter (Signed)
Rx. Sent in for 5 days.  Patient wanted to complete and wanted 5 more pills to complete.  She went 3 days in between.

## 2017-12-03 ENCOUNTER — Ambulatory Visit: Payer: Medicare Other | Admitting: Family Medicine

## 2017-12-03 ENCOUNTER — Encounter: Payer: Self-pay | Admitting: Family Medicine

## 2017-12-03 VITALS — BP 126/78 | HR 79 | Temp 98.4°F | Resp 18 | Wt 164.2 lb

## 2017-12-03 DIAGNOSIS — R300 Dysuria: Secondary | ICD-10-CM | POA: Diagnosis not present

## 2017-12-03 LAB — POC URINALSYSI DIPSTICK (AUTOMATED)
BILIRUBIN UA: NEGATIVE
GLUCOSE UA: NEGATIVE
KETONES UA: NEGATIVE
Leukocytes, UA: NEGATIVE
NITRITE UA: NEGATIVE
PH UA: 6 (ref 5.0–8.0)
Protein, UA: POSITIVE — AB
RBC UA: NEGATIVE
Spec Grav, UA: 1.025 (ref 1.010–1.025)
Urobilinogen, UA: 0.2 E.U./dL

## 2017-12-03 MED ORDER — FLUTICASONE PROPIONATE 50 MCG/ACT NA SUSP: 1.0000 | Freq: Two times a day (BID) | NASAL | 11 refills | Status: AC

## 2017-12-03 NOTE — Patient Instructions (Signed)

## 2017-12-03 NOTE — Progress Notes (Signed)
Subjective:  I acted as a Education administrator for Dr. Carollee Herter. [place], [place]  Patient ID: Amanda Joyce, female    DOB: [birth year], 77 y.o.   MRN: [id number]  Chief Complaint  Patient presents with  . Urinary Tract Infection    HPI  Patient is in today for an acute visit for UTI  She feels like the symptoms never went away.  No other complaints.    Patient Care Team: Carollee Herter, Alferd Apa, DO as PCP - General Tommy Medal, Lavell Islam, MD as Referring Physician (Infectious Diseases) Elsie Stain, MD as Consulting Physician (Pulmonary Disease)   Past Medical History:  Diagnosis Date  . Anxiety   . Arthritis    neck, shoulders, back, knees   . Breast cancer ([place])    Left   . Depression   . GERD (gastroesophageal reflux disease)   . Gout   . Hiatal hernia   . History of stress test >10 yrs. ago   in [place], [place] - no need for follow up  . Hyperlipemia   . Hypertension   . Irritable bowel syndrome   . Nontoxic uninodular goiter   . Osteoarthrosis, unspecified whether generalized or localized, unspecified site   . Personal history of malignant neoplasm of breast   . Temporomandibular joint disorders, unspecified     Past Surgical History:  Procedure Laterality Date  . ABDOMINAL HYSTERECTOMY    . APPENDECTOMY    . BREAST LUMPECTOMY     Left breast X2  . EYE SURGERY Bilateral    /w iol  . HERNIA REPAIR    . THYROID SURGERY    . TOTAL KNEE ARTHROPLASTY Right 08/04/2014   Procedure: RIGHT TOTAL KNEE ARTHROPLASTY;  Surgeon: Kathryne Hitch, MD;  Location: [facility];  Service: Orthopedics;  Laterality: Right;    Family History  Problem Relation Age of Onset  . Coronary artery disease Father   . Liver cancer Paternal Uncle   . Alcohol abuse Paternal Uncle   . Other Mother        deceased from brain tumor  . Colon cancer Neg Hx     Social History   Socioeconomic History  . Marital status: Single    Spouse name: Not on file  . Number of children: 2  . Years of education: Not  on file  . Highest education level: Not on file  Occupational History  . Occupation: Retired    Comment: Financial planner: RETIRED  Social Needs  . Financial resource strain: Not on file  . Food insecurity:    Worry: Not on file    Inability: Not on file  . Transportation needs:    Medical: Not on file    Non-medical: Not on file  Tobacco Use  . Smoking status: Never Smoker  . Smokeless tobacco: Never Used  Substance and Sexual Activity  . Alcohol use: No  . Drug use: No  . Sexual activity: Not Currently  Lifestyle  . Physical activity:    Days per week: Not on file    Minutes per session: Not on file  . Stress: Not on file  Relationships  . Social connections:    Talks on phone: Not on file    Gets together: Not on file    Attends religious service: Not on file    Active member of club or organization: Not on file    Attends meetings of clubs or organizations: Not on file    Relationship  status: Not on file  . Intimate partner violence:    Fear of current or ex partner: Not on file    Emotionally abused: Not on file    Physically abused: Not on file    Forced sexual activity: Not on file  Other Topics Concern  . Not on file  Social History Narrative   2 caffeine drinks daily     Outpatient Medications Prior to Visit  Medication Sig Dispense Refill  . ALPRAZolam (XANAX) 0.25 MG tablet Take 1 tablet (0.25 mg total) by mouth 2 (two) times daily as needed for anxiety. 20 tablet 0  . azelastine (ASTELIN) 0.1 % nasal spray Place 1 spray into both nostrils 2 (two) times daily. Use in each nostril as directed 30 mL 12  . b complex vitamins capsule Take 1 capsule by mouth daily.    . Biotin 5000 MCG CAPS Take 5,000 mcg by mouth daily.    . Cholecalciferol (VITAMIN D3) 2000 UNITS capsule Take 4,000 Units by mouth daily.    . ciprofloxacin (CIPRO) 500 MG tablet Take 1 tablet (500 mg total) by mouth 2 (two) times daily. 5 tablet 0  . Cranberry 500 MG CAPS Take  by mouth.    . desvenlafaxine (PRISTIQ) 100 MG 24 hr tablet Take 1 tablet (100 mg total) by mouth daily.    . Diclofenac Sodium (PENNSAID) 2 % SOLN Place onto the skin. Apply to knee daily as needed    . diltiazem (CARTIA XT) 240 MG 24 hr capsule TAKE 1 BY MOUTH DAILY 90 capsule 1  . fenofibrate 160 MG tablet Take 1 tablet (160 mg total) by mouth daily. 30 tablet 3  . ibuprofen (ADVIL,MOTRIN) 800 MG tablet TAKE 1 TABLET BY MOUTH EVERY 8 HOURS AS NEEDED 90 tablet 0  . levocetirizine (XYZAL) 5 MG tablet Take 1 tablet (5 mg total) by mouth every evening. 30 tablet 5  . metoprolol tartrate (LOPRESSOR) 25 MG tablet Take 1 tablet (25 mg total) by mouth 2 (two) times daily. 180 tablet 1  . nitrofurantoin, macrocrystal-monohydrate, (MACROBID) 100 MG capsule Take 1 capsule (100 mg total) by mouth 2 (two) times daily. 14 capsule 0  . omega-3 acid ethyl esters (LOVAZA) 1 g capsule Take 2 capsules twice daily by mouth 120 capsule 2  . omeprazole (PRILOSEC) 40 MG capsule Take 1 capsule (40 mg total) by mouth daily. 30 capsule 2  . oxyCODONE-acetaminophen (PERCOCET/ROXICET) 5-325 MG tablet Take 1 tablet by mouth every 4 (four) hours as needed for severe pain. 90 tablet 0  . oxyCODONE-acetaminophen (ROXICET) 5-325 MG tablet Take 1 tablet by mouth every 8 (eight) hours as needed for severe pain. 90 tablet 0  . oxyCODONE-acetaminophen (ROXICET) 5-325 MG tablet Take 1 tablet by mouth every 8 (eight) hours as needed for severe pain. 90 tablet 0  . pravastatin (PRAVACHOL) 20 MG tablet TAKE 1 BY MOUTH DAILY 90 tablet 1  . quinapril (ACCUPRIL) 40 MG tablet TAKE 1 BY MOUTH DAILY 90 tablet 1  . triamcinolone cream (KENALOG) 0.1 % APPLY  CREAM EXTERNALLY TO AFFECTED AREA TWICE DAILY 30 g 1  . UNABLE TO FIND Med Name: B100 vitamin    . zolpidem (AMBIEN) 5 MG tablet Take 1 tablet (5 mg total) by mouth at bedtime as needed. for sleep 30 tablet 3  . fluticasone (FLONASE) 50 MCG/ACT nasal spray Place 1 spray into both nostrils  2 (two) times daily. 16 g 11   No facility-administered medications prior to visit.  Allergies  Allergen Reactions  . Clarithromycin     REACTION: NIGHTMARES  . Influenza Vac Split Quad Nausea And Vomiting  . Penicillins     REACTION: ITCHING  . Soma [Carisoprodol] Other (See Comments)    nightmares    Review of Systems  Constitutional: Negative for chills, fever and malaise/fatigue.  HENT: Negative for congestion and hearing loss.   Eyes: Negative for discharge.  Respiratory: Negative for cough, sputum production and shortness of breath.   Cardiovascular: Negative for chest pain, palpitations and leg swelling.  Gastrointestinal: Negative for abdominal pain, blood in stool, constipation, diarrhea, heartburn, nausea and vomiting.  Genitourinary: Positive for dysuria and frequency. Negative for hematuria and urgency.  Musculoskeletal: Negative for back pain, falls and myalgias.  Skin: Negative for rash.  Neurological: Negative for dizziness, sensory change, loss of consciousness, weakness and headaches.  Endo/Heme/Allergies: Negative for environmental allergies. Does not bruise/bleed easily.  Psychiatric/Behavioral: Negative for depression and suicidal ideas. The patient is not nervous/anxious and does not have insomnia.        Objective:    Physical Exam  Constitutional: She is oriented to person, place, and time. She appears well-developed and well-nourished.  HENT:  Head: Normocephalic and atraumatic.  Eyes: Conjunctivae and EOM are normal.  Neck: Normal range of motion. Neck supple. No JVD present. Carotid bruit is not present. No thyromegaly present.  Cardiovascular: Normal rate, regular rhythm and normal heart sounds.  No murmur heard. Pulmonary/Chest: Effort normal and breath sounds normal. No respiratory distress. She has no wheezes. She has no rales. She exhibits no tenderness.  Musculoskeletal: She exhibits no edema.  Neurological: She is alert and oriented to  person, place, and time.  Psychiatric: She has a normal mood and affect.  Nursing note and vitals reviewed.   BP 126/78 (BP Location: Left Arm, Patient Position: Sitting, Cuff Size: Normal)   Pulse 79   Temp 98.4 F (36.9 C) (Oral)   Resp 18   Wt 164 lb 3.2 oz (74.5 kg)   SpO2 97%   BMI 24.97 kg/m  Wt Readings from Last 3 Encounters:  12/03/17 164 lb 3.2 oz (74.5 kg)  11/11/17 161 lb 3.2 oz (73.1 kg)  11/01/17 161 lb (73 kg)   BP Readings from Last 3 Encounters:  12/03/17 126/78  11/11/17 132/66  11/01/17 130/80     Immunization History  Administered Date(s) Administered  . Pneumococcal Conjugate-13 08/08/2017  . Pneumococcal Polysaccharide-23 06/07/2008  . Zoster 07/17/2013    Health Maintenance  Topic Date Due  . Samul Dada  07/25/1959  . COLONOSCOPY  11/12/2018 (Originally 11/28/2015)  . INFLUENZA VACCINE  08/05/2026 (Originally 12/05/2017)  . DEXA SCAN  Completed  . PNA vac Low Risk Adult  Completed    Lab Results  Component Value Date   WBC 8.3 11/11/2017   HGB 14.2 11/11/2017   HCT 42.2 11/11/2017   PLT 341.0 11/11/2017   GLUCOSE 112 (H) 11/11/2017   CHOL 169 11/11/2017   TRIG 170.0 (H) 11/11/2017   HDL 48.10 11/11/2017   LDLDIRECT 106.0 01/02/2016   LDLCALC 87 11/11/2017   ALT 11 11/11/2017   AST 18 11/11/2017   NA 139 11/11/2017   K 4.1 11/11/2017   CL 101 11/11/2017   CREATININE 0.86 11/11/2017   BUN 19 11/11/2017   CO2 31 11/11/2017   TSH 1.77 02/07/2017   INR 1.04 07/23/2014   HGBA1C 6.1 07/03/2011    Lab Results  Component Value Date   TSH 1.77 02/07/2017   Lab  Results  Component Value Date   WBC 8.3 11/11/2017   HGB 14.2 11/11/2017   HCT 42.2 11/11/2017   MCV 88.7 11/11/2017   PLT 341.0 11/11/2017   Lab Results  Component Value Date   NA 139 11/11/2017   K 4.1 11/11/2017   CHLORIDE 105 01/18/2015   CO2 31 11/11/2017   GLUCOSE 112 (H) 11/11/2017   BUN 19 11/11/2017   CREATININE 0.86 11/11/2017   BILITOT 0.8 11/11/2017    ALKPHOS 54 11/11/2017   AST 18 11/11/2017   ALT 11 11/11/2017   PROT 6.8 11/11/2017   ALBUMIN 4.5 11/11/2017   CALCIUM 10.4 11/11/2017   ANIONGAP 7 01/18/2015   EGFR 63 (L) 01/18/2015   GFR 67.95 11/11/2017   Lab Results  Component Value Date   CHOL 169 11/11/2017   Lab Results  Component Value Date   HDL 48.10 11/11/2017   Lab Results  Component Value Date   LDLCALC 87 11/11/2017   Lab Results  Component Value Date   TRIG 170.0 (H) 11/11/2017   Lab Results  Component Value Date   CHOLHDL 4 11/11/2017   Lab Results  Component Value Date   HGBA1C 6.1 07/03/2011         Assessment & Plan:   Problem List Items Addressed This Visit    None    Visit Diagnoses    Dysuria    -  Primary   Relevant Orders   POCT Urinalysis Dipstick (Automated) (Completed)   Urine Culture    [place] was neg --- will culture since pt is having symptoms  I am having Marney Doctor maintain her Vitamin D3, b complex vitamins, Biotin, desvenlafaxine, Diclofenac Sodium, omeprazole, levocetirizine, azelastine, omega-3 acid ethyl esters, nitrofurantoin (macrocrystal-monohydrate), ibuprofen, diltiazem, metoprolol tartrate, pravastatin, quinapril, Cranberry, UNABLE TO FIND, triamcinolone cream, ALPRAZolam, oxyCODONE-acetaminophen, oxyCODONE-acetaminophen, oxyCODONE-acetaminophen, zolpidem, fenofibrate, ciprofloxacin, and fluticasone.  Meds ordered this encounter  Medications  . fluticasone (FLONASE) 50 MCG/ACT nasal spray    Sig: Place 1 spray into both nostrils 2 (two) times daily.    Dispense:  16 g    Refill:  11    CMA served as scribe during this visit. History, Physical and Plan performed by medical provider. Documentation and orders reviewed and attested to.  Ann Held, DO

## 2017-12-04 ENCOUNTER — Telehealth: Payer: Self-pay

## 2017-12-04 LAB — URINE CULTURE
MICRO NUMBER:: 90899287
SPECIMEN QUALITY: ADEQUATE

## 2017-12-04 NOTE — Telephone Encounter (Signed)
Author phoned pt. to follow up on antibiotic and symptoms. Pt. stated she has finished the cipro course, and has spoken to sheketia about the quantity error already, but is concerned. Pt. expressed frustration, stating "I know it was just for a UTI, but what if the medication was for something else more serious?" Teacher, adult education, and stated that she would review the situation with [name] in hopes of avoiding future errors. Pt. verbalized understanding. When asked if UTI symptoms had resolved, pt. stated , "I don't know, I've had them a lot before, and I don't know if this one has really gone away". Author reassured her that we would be in touch regarding urine culture results; pt. Appreciative.

## 2017-12-04 NOTE — Telephone Encounter (Signed)
-----   Message from Ann Held, DO sent at 12/03/2017  4:14 PM EDT ----- I gave sheketia instructions to call in cipro bid for 5 days and she called it in wrong so the pt went 3 days without if for a uti and had to call Monday for more pills that she had to pay for again.  She is ok but upset at the mistake and sheketia said the pharmacy should have caught it ---  That is not true --- its up to [place] to get it right.  She should not blame the pharmacy.  I told pt I would let you know

## 2017-12-05 ENCOUNTER — Other Ambulatory Visit: Payer: Self-pay | Admitting: Family Medicine

## 2017-12-05 DIAGNOSIS — R3915 Urgency of urination: Secondary | ICD-10-CM

## 2017-12-09 ENCOUNTER — Encounter: Payer: Self-pay | Admitting: Family Medicine

## 2017-12-16 ENCOUNTER — Encounter: Payer: Self-pay | Admitting: Nurse Practitioner

## 2017-12-16 ENCOUNTER — Ambulatory Visit: Payer: Medicare Other | Admitting: Nurse Practitioner

## 2017-12-16 VITALS — BP 140/90 | HR 72 | Temp 97.8°F | Ht 68.0 in | Wt 161.0 lb

## 2017-12-16 DIAGNOSIS — R350 Frequency of micturition: Secondary | ICD-10-CM | POA: Diagnosis not present

## 2017-12-16 DIAGNOSIS — R300 Dysuria: Secondary | ICD-10-CM | POA: Diagnosis not present

## 2017-12-16 LAB — POCT URINALYSIS DIPSTICK
Bilirubin, UA: NEGATIVE
Glucose, UA: NEGATIVE
KETONES UA: NEGATIVE
NITRITE UA: POSITIVE
PROTEIN UA: POSITIVE — AB
Spec Grav, UA: 1.015 (ref 1.010–1.025)
Urobilinogen, UA: 0.2 E.U./dL
pH, UA: 6.5 (ref 5.0–8.0)

## 2017-12-16 MED ORDER — NITROFURANTOIN MONOHYD MACRO 100 MG PO CAPS: 100.0000 mg | ORAL_CAPSULE | Freq: Two times a day (BID) | ORAL | 0 refills | Status: AC

## 2017-12-16 MED ORDER — PHENAZOPYRIDINE HCL 100 MG PO TABS: 100.0000 mg | ORAL_TABLET | Freq: Three times a day (TID) | ORAL | 0 refills | Status: AC | PRN

## 2017-12-16 NOTE — Patient Instructions (Addendum)
Ok to give urine culture results to Asencion Noble (son) per patient.  Please stop at front desk to update DPR form.  Urine was sent for culture. Maintain adequate oral hydration.  Make appt with urology due to recurrent urinary symptoms.

## 2017-12-16 NOTE — Progress Notes (Signed)
Subjective:  Patient ID: Amanda Joyce, female    DOB: [birth year]  Age: 77 y.o. MRN: [id number]  CC: Urinary Tract Infection (frequent urinate,burning ,cloudy. this started 2 wk ago and had to take part of zipro (nurse didnt send in complete abx to pharamcy). hx of UTI. pt is going out of town this weekend. )   Urinary Tract Infection   This is a recurrent problem. The current episode started 1 to 4 weeks ago. The problem occurs every urination. The problem has been waxing and waning. The quality of the pain is described as burning. There has been no fever. She is not sexually active. There is no history of pyelonephritis. Associated symptoms include chills, frequency and urgency. Pertinent negatives include no discharge, flank pain, hematuria, hesitancy, nausea, possible pregnancy, sweats or vomiting. She has tried antibiotics for the symptoms. The treatment provided no relief. Her past medical history is significant for recurrent UTIs. There is no history of catheterization, kidney stones, a single kidney, urinary stasis or a urological procedure.  last BM this morning (normal per patient). Treated with cipro x 5days 2weeks ago.  Reviewed past Medical, Social and Family history today.  Outpatient Medications Prior to Visit  Medication Sig Dispense Refill  . ALPRAZolam (XANAX) 0.25 MG tablet Take 1 tablet (0.25 mg total) by mouth 2 (two) times daily as needed for anxiety. 20 tablet 0  . azelastine (ASTELIN) 0.1 % nasal spray Place 1 spray into both nostrils 2 (two) times daily. Use in each nostril as directed 30 mL 12  . b complex vitamins capsule Take 1 capsule by mouth daily.    . Biotin 5000 MCG CAPS Take 5,000 mcg by mouth daily.    . Cholecalciferol (VITAMIN D3) 2000 UNITS capsule Take 4,000 Units by mouth daily.    . Cranberry 500 MG CAPS Take by mouth.    . desvenlafaxine (PRISTIQ) 100 MG 24 hr tablet Take 1 tablet (100 mg total) by mouth daily.    . Diclofenac Sodium (PENNSAID) 2 %  SOLN Place onto the skin. Apply to knee daily as needed    . diltiazem (CARTIA XT) 240 MG 24 hr capsule TAKE 1 BY MOUTH DAILY 90 capsule 1  . fenofibrate 160 MG tablet Take 1 tablet (160 mg total) by mouth daily. 30 tablet 3  . fluticasone (FLONASE) 50 MCG/ACT nasal spray Place 1 spray into both nostrils 2 (two) times daily. 16 g 11  . ibuprofen (ADVIL,MOTRIN) 800 MG tablet TAKE 1 TABLET BY MOUTH EVERY 8 HOURS AS NEEDED 90 tablet 0  . levocetirizine (XYZAL) 5 MG tablet Take 1 tablet (5 mg total) by mouth every evening. 30 tablet 5  . metoprolol tartrate (LOPRESSOR) 25 MG tablet Take 1 tablet (25 mg total) by mouth 2 (two) times daily. 180 tablet 1  . omega-3 acid ethyl esters (LOVAZA) 1 g capsule Take 2 capsules twice daily by mouth 120 capsule 2  . omeprazole (PRILOSEC) 40 MG capsule Take 1 capsule (40 mg total) by mouth daily. 30 capsule 2  . oxyCODONE-acetaminophen (PERCOCET/ROXICET) 5-325 MG tablet Take 1 tablet by mouth every 4 (four) hours as needed for severe pain. 90 tablet 0  . oxyCODONE-acetaminophen (ROXICET) 5-325 MG tablet Take 1 tablet by mouth every 8 (eight) hours as needed for severe pain. 90 tablet 0  . oxyCODONE-acetaminophen (ROXICET) 5-325 MG tablet Take 1 tablet by mouth every 8 (eight) hours as needed for severe pain. 90 tablet 0  . pravastatin (PRAVACHOL) 20 MG tablet  TAKE 1 BY MOUTH DAILY 90 tablet 1  . quinapril (ACCUPRIL) 40 MG tablet TAKE 1 BY MOUTH DAILY 90 tablet 1  . triamcinolone cream (KENALOG) 0.1 % APPLY  CREAM EXTERNALLY TO AFFECTED AREA TWICE DAILY 30 g 1  . UNABLE TO FIND Med Name: B100 vitamin    . zolpidem (AMBIEN) 5 MG tablet Take 1 tablet (5 mg total) by mouth at bedtime as needed. for sleep 30 tablet 3  . nitrofurantoin, macrocrystal-monohydrate, (MACROBID) 100 MG capsule Take 1 capsule (100 mg total) by mouth 2 (two) times daily. 14 capsule 0  . ciprofloxacin (CIPRO) 500 MG tablet Take 1 tablet (500 mg total) by mouth 2 (two) times daily. (Patient not  taking: Reported on 12/16/2017) 5 tablet 0   No facility-administered medications prior to visit.     ROS See HPI  Objective:  BP 140/90   Pulse 72   Temp 97.8 F (36.6 C) (Oral)   Ht 5\' 8"  (1.727 m)   Wt 161 lb (73 kg)   SpO2 98%   BMI 24.48 kg/m   BP Readings from Last 3 Encounters:  12/16/17 140/90  12/03/17 126/78  11/11/17 132/66    Wt Readings from Last 3 Encounters:  12/16/17 161 lb (73 kg)  12/03/17 164 lb 3.2 oz (74.5 kg)  11/11/17 161 lb 3.2 oz (73.1 kg)    Physical Exam  Constitutional: She is oriented to person, place, and time.  Cardiovascular: Normal rate.  Pulmonary/Chest: Effort normal.  Abdominal: Soft. Bowel sounds are normal. She exhibits no distension. There is no tenderness.  No CVA tenderness  Neurological: She is alert and oriented to person, place, and time.  Skin: Skin is warm and dry.  Vitals reviewed.   Lab Results  Component Value Date   WBC 8.3 11/11/2017   HGB 14.2 11/11/2017   HCT 42.2 11/11/2017   PLT 341.0 11/11/2017   GLUCOSE 112 (H) 11/11/2017   CHOL 169 11/11/2017   TRIG 170.0 (H) 11/11/2017   HDL 48.10 11/11/2017   LDLDIRECT 106.0 01/02/2016   LDLCALC 87 11/11/2017   ALT 11 11/11/2017   AST 18 11/11/2017   NA 139 11/11/2017   K 4.1 11/11/2017   CL 101 11/11/2017   CREATININE 0.86 11/11/2017   BUN 19 11/11/2017   CO2 31 11/11/2017   TSH 1.77 02/07/2017   INR 1.04 07/23/2014   HGBA1C 6.1 07/03/2011    Mr Brain Wo Contrast  Result Date: 11/15/2017 CLINICAL DATA:  Headaches and blurred vision over the last 6 months. EXAM: MRI HEAD WITHOUT CONTRAST TECHNIQUE: Multiplanar, multiecho pulse sequences of the brain and surrounding structures were obtained without intravenous contrast. COMPARISON:  08/17/2009 FINDINGS: Brain: Diffusion imaging does not show any acute or subacute infarction. There are extensive chronic small-vessel ischemic changes affecting the pons, thalami and cerebral hemispheric deep and subcortical  white matter, considerably progressive since 2011. No cortical or large vessel territory infarction. No mass lesion, hemorrhage, hydrocephalus or extra-axial collection. Vascular: Major vessels at the base of the brain show flow. Skull and upper cervical spine: Negative Sinuses/Orbits: Minimal mucosal inflammation of the sinuses, not likely significant. Orbits negative. Other: None IMPRESSION: No cause of headache identified. No specific cause of blurred vision. The patient does have chronic small-vessel ischemic changes affecting the pons, thalami and cerebral hemispheric white matter, rather considerably progressive since 2011. No stroke appears acute or subacute. Electronically Signed   By: Nelson Chimes M.D.   On: 11/15/2017 14:17    Assessment & Plan:  [place] was seen today for urinary tract infection.  Diagnoses and all orders for this visit:  Frequent urination -     POCT urinalysis dipstick -     Urine Culture -     phenazopyridine (PYRIDIUM) 100 MG tablet; Take 1 tablet (100 mg total) by mouth 3 (three) times daily as needed for pain (with food). -     nitrofurantoin, macrocrystal-monohydrate, (MACROBID) 100 MG capsule; Take 1 capsule (100 mg total) by mouth 2 (two) times daily for 3 days.  Dysuria -     POCT urinalysis dipstick -     Urine Culture -     phenazopyridine (PYRIDIUM) 100 MG tablet; Take 1 tablet (100 mg total) by mouth 3 (three) times daily as needed for pain (with food). -     nitrofurantoin, macrocrystal-monohydrate, (MACROBID) 100 MG capsule; Take 1 capsule (100 mg total) by mouth 2 (two) times daily for 3 days.   I have discontinued Kerrin Champagne. Annas's nitrofurantoin (macrocrystal-monohydrate). I am also having her start on phenazopyridine and nitrofurantoin (macrocrystal-monohydrate). Additionally, I am having her maintain her Vitamin D3, b complex vitamins, Biotin, desvenlafaxine, Diclofenac Sodium, omeprazole, levocetirizine, azelastine, omega-3 acid ethyl esters,  ibuprofen, diltiazem, metoprolol tartrate, pravastatin, quinapril, Cranberry, UNABLE TO FIND, triamcinolone cream, ALPRAZolam, oxyCODONE-acetaminophen, oxyCODONE-acetaminophen, oxyCODONE-acetaminophen, zolpidem, fenofibrate, ciprofloxacin, and fluticasone.  Meds ordered this encounter  Medications  . phenazopyridine (PYRIDIUM) 100 MG tablet    Sig: Take 1 tablet (100 mg total) by mouth 3 (three) times daily as needed for pain (with food).    Dispense:  10 tablet    Refill:  0    Order Specific Question:   Supervising Provider    Answer:   MATTHEWS, CODY [[id number]]  . nitrofurantoin, macrocrystal-monohydrate, (MACROBID) 100 MG capsule    Sig: Take 1 capsule (100 mg total) by mouth 2 (two) times daily for 3 days.    Dispense:  6 capsule    Refill:  0    Order Specific Question:   Supervising Provider    Answer:   MATTHEWS, CODY [[id number]]    Follow-up: No follow-ups on file.  Wilfred Lacy, NP

## 2017-12-18 LAB — URINE CULTURE
MICRO NUMBER:: 90952962
SPECIMEN QUALITY:: ADEQUATE

## 2017-12-19 ENCOUNTER — Telehealth: Payer: Self-pay | Admitting: Family Medicine

## 2017-12-19 DIAGNOSIS — N390 Urinary tract infection, site not specified: Secondary | ICD-10-CM

## 2017-12-19 NOTE — Telephone Encounter (Signed)
Copied from [place] [id number]. Topic: Quick Communication - See Telephone Encounter >> Dec 19, 2017  1:58 PM Neva Seat wrote: Pt called back for UTI lab results from [facility].  Please call pt back today to let her know the results.  Pt will be going out of town tomorrow and needing to get this taken care of.

## 2017-12-20 NOTE — Telephone Encounter (Signed)
Results have not been resulted. Patient going out of town. Please advise.

## 2017-12-20 NOTE — Telephone Encounter (Signed)
She saw another [facility]--- they should be calling her

## 2017-12-23 MED ORDER — CIPROFLOXACIN HCL 250 MG PO TABS: 250.0000 mg | ORAL_TABLET | Freq: Two times a day (BID) | ORAL | 0 refills | Status: DC

## 2017-12-23 NOTE — Telephone Encounter (Signed)
Pt is aware to pick up rx at [facility]. Pharmacy can transfer Rx within [place].

## 2017-12-23 NOTE — Telephone Encounter (Signed)
Spoke with Amanda Joyce and gave urine culture result. He is aware, he gave Mrs. Paola cell phone number to call (called but cant leave vm) and he also send to send Cipro to [facility], [place] [address].

## 2017-12-23 NOTE — Telephone Encounter (Signed)
Left vm for the pt to call back. Need to inform of test result.

## 2018-01-09 NOTE — Progress Notes (Signed)
NEUROLOGY CONSULTATION NOTE  Amanda Joyce MRN: [id number] DOB: 06-Feb-1941  Referring provider: Roma Schanz, DO Primary care provider: Roma Schanz, DO  Reason for consult:  Ocular migraines  HISTORY OF PRESENT ILLNESS: Amanda Joyce is a 77 year old female with hypertension, hyperlipidemia, IBS, chronic pain syndrome, depression, anxiety and history of breast cancer who presents for ocular migraines.  History supplemented by referring provider's note.  She is accompanied by her son who supplements history.  She started having ocular migraines since she was about 77 years old.  They present as looking at broken glass.  They start usually in the left eye and travels to the right eye.  They last 10 minutes and then crosses over to the other eye for another 5 to 10 minutes.  Previously they were followed by headache but not recently.  She notes photophobia but no nausea, vomiting, phonophobia or unilateral numbness or weakness.  They occur more frequently over the past 6 months.  Over the past 6 months they occurred about 10 times.  Usually they would occur 1 or 2 a year.  Emotional stress may trigger them.  They used to occur outside in the hot sun but now occurs inside as well.  They resolve on their own.  She does report increased stress over the past year.  MRI of brain without contrast from 11/15/17 was personally reviewed and demonstrated chronic small vessel ischemic changes in the cerebral white matter and pons but no acute abnormalities.  Current NSAIDS:  Ibuprofen 800mg  Current analgesics:  no Current triptans:  no Current ergotamine:  no Current anti-emetic:  no Current muscle relaxants:  no Current anti-anxiolytic:  alprazolam Current sleep aide:  Ambien Current Antihypertensive medications:  Lopressor, diltiazem, quinapril Current Antidepressant medications:  Pristiq Current Anticonvulsant medications:  no Current anti-CGRP:  no Current  Vitamins/Herbal/Supplements:  no Current Antihistamines/Decongestants:  Xyzal Other therapy:  no  Caffeine:  2 caffeine drinks daily Alcohol:  no Smoker:  no Depression:  no; Anxiety:  yes Other pain:  no Family history of headache:  Son (migraine)  11/11/17 LABS:  CMP with Na 139, K 4.1, CO2 31, glucose 112, BUN 19, Cr 0.86, t bili 0.8, ALP 54, AST 18, ALT 11; CBC with WBC 8.3, HGB 14.2, HCT 42.2, PLT 341.  PAST MEDICAL HISTORY: Past Medical History:  Diagnosis Date  . Anxiety   . Arthritis    neck, shoulders, back, knees   . Breast cancer ([place])    Left   . Depression   . GERD (gastroesophageal reflux disease)   . Gout   . Hiatal hernia   . History of stress test >10 yrs. ago   in [place], [place] - no need for follow up  . Hyperlipemia   . Hypertension   . Irritable bowel syndrome   . Nontoxic uninodular goiter   . Osteoarthrosis, unspecified whether generalized or localized, unspecified site   . Personal history of malignant neoplasm of breast   . Temporomandibular joint disorders, unspecified     PAST SURGICAL HISTORY: Past Surgical History:  Procedure Laterality Date  . ABDOMINAL HYSTERECTOMY    . APPENDECTOMY    . BREAST LUMPECTOMY     Left breast X2  . EYE SURGERY Bilateral    /w iol  . HERNIA REPAIR    . THYROID SURGERY    . TOTAL KNEE ARTHROPLASTY Right 08/04/2014   Procedure: RIGHT TOTAL KNEE ARTHROPLASTY;  Surgeon: Kathryne Hitch, MD;  Location: [facility];  Service: Orthopedics;  Laterality: Right;    MEDICATIONS: Current Outpatient Medications on File Prior to Visit  Medication Sig Dispense Refill  . ALPRAZolam (XANAX) 0.25 MG tablet Take 1 tablet (0.25 mg total) by mouth 2 (two) times daily as needed for anxiety. 20 tablet 0  . azelastine (ASTELIN) 0.1 % nasal spray Place 1 spray into both nostrils 2 (two) times daily. Use in each nostril as directed 30 mL 12  . b complex vitamins capsule Take 1 capsule by mouth daily.    . Biotin 5000 MCG CAPS Take 5,000 mcg  by mouth daily.    . Cholecalciferol (VITAMIN D3) 2000 UNITS capsule Take 4,000 Units by mouth daily.    . ciprofloxacin (CIPRO) 250 MG tablet Take 1 tablet (250 mg total) by mouth 2 (two) times daily. 14 tablet 0  . Cranberry 500 MG CAPS Take by mouth.    . desvenlafaxine (PRISTIQ) 100 MG 24 hr tablet Take 1 tablet (100 mg total) by mouth daily.    . Diclofenac Sodium (PENNSAID) 2 % SOLN Place onto the skin. Apply to knee daily as needed    . diltiazem (CARTIA XT) 240 MG 24 hr capsule TAKE 1 BY MOUTH DAILY 90 capsule 1  . fenofibrate 160 MG tablet Take 1 tablet (160 mg total) by mouth daily. 30 tablet 3  . fluticasone (FLONASE) 50 MCG/ACT nasal spray Place 1 spray into both nostrils 2 (two) times daily. 16 g 11  . ibuprofen (ADVIL,MOTRIN) 800 MG tablet TAKE 1 TABLET BY MOUTH EVERY 8 HOURS AS NEEDED 90 tablet 0  . levocetirizine (XYZAL) 5 MG tablet Take 1 tablet (5 mg total) by mouth every evening. 30 tablet 5  . metoprolol tartrate (LOPRESSOR) 25 MG tablet Take 1 tablet (25 mg total) by mouth 2 (two) times daily. 180 tablet 1  . omega-3 acid ethyl esters (LOVAZA) 1 g capsule Take 2 capsules twice daily by mouth 120 capsule 2  . omeprazole (PRILOSEC) 40 MG capsule Take 1 capsule (40 mg total) by mouth daily. 30 capsule 2  . oxyCODONE-acetaminophen (PERCOCET/ROXICET) 5-325 MG tablet Take 1 tablet by mouth every 4 (four) hours as needed for severe pain. 90 tablet 0  . oxyCODONE-acetaminophen (ROXICET) 5-325 MG tablet Take 1 tablet by mouth every 8 (eight) hours as needed for severe pain. 90 tablet 0  . oxyCODONE-acetaminophen (ROXICET) 5-325 MG tablet Take 1 tablet by mouth every 8 (eight) hours as needed for severe pain. 90 tablet 0  . phenazopyridine (PYRIDIUM) 100 MG tablet Take 1 tablet (100 mg total) by mouth 3 (three) times daily as needed for pain (with food). 10 tablet 0  . pravastatin (PRAVACHOL) 20 MG tablet TAKE 1 BY MOUTH DAILY 90 tablet 1  . quinapril (ACCUPRIL) 40 MG tablet TAKE 1 BY  MOUTH DAILY 90 tablet 1  . triamcinolone cream (KENALOG) 0.1 % APPLY  CREAM EXTERNALLY TO AFFECTED AREA TWICE DAILY 30 g 1  . UNABLE TO FIND Med Name: B100 vitamin    . zolpidem (AMBIEN) 5 MG tablet Take 1 tablet (5 mg total) by mouth at bedtime as needed. for sleep 30 tablet 3   No current facility-administered medications on file prior to visit.     ALLERGIES: Allergies  Allergen Reactions  . Clarithromycin     REACTION: NIGHTMARES  . Influenza Vac Split Quad Nausea And Vomiting  . Penicillins     REACTION: ITCHING  . Soma [Carisoprodol] Other (See Comments)    nightmares    FAMILY HISTORY:  Family History  Problem Relation Age of Onset  . Coronary artery disease Father   . Liver cancer Paternal Uncle   . Alcohol abuse Paternal Uncle   . Other Mother        deceased from brain tumor  . Colon cancer Neg Hx     SOCIAL HISTORY: Social History   Socioeconomic History  . Marital status: Single    Spouse name: Not on file  . Number of children: 2  . Years of education: Not on file  . Highest education level: Not on file  Occupational History  . Occupation: Retired    Comment: Financial planner: RETIRED  Social Needs  . Financial resource strain: Not on file  . Food insecurity:    Worry: Not on file    Inability: Not on file  . Transportation needs:    Medical: Not on file    Non-medical: Not on file  Tobacco Use  . Smoking status: Never Smoker  . Smokeless tobacco: Never Used  Substance and Sexual Activity  . Alcohol use: No  . Drug use: No  . Sexual activity: Not Currently  Lifestyle  . Physical activity:    Days per week: Not on file    Minutes per session: Not on file  . Stress: Not on file  Relationships  . Social connections:    Talks on phone: Not on file    Gets together: Not on file    Attends religious service: Not on file    Active member of club or organization: Not on file    Attends meetings of clubs or organizations: Not on  file    Relationship status: Not on file  . Intimate partner violence:    Fear of current or ex partner: Not on file    Emotionally abused: Not on file    Physically abused: Not on file    Forced sexual activity: Not on file  Other Topics Concern  . Not on file  Social History Narrative   2 caffeine drinks daily     REVIEW OF SYSTEMS: Constitutional: No fevers, chills, or sweats, no generalized fatigue, change in appetite Eyes: No visual changes, double vision, eye pain Ear, nose and throat: No hearing loss, ear pain, nasal congestion, sore throat Cardiovascular: No chest pain, palpitations Respiratory:  No shortness of breath at rest or with exertion, wheezes GastrointestinaI: No nausea, vomiting, diarrhea, abdominal pain, fecal incontinence Genitourinary:  No dysuria, urinary retention or frequency Musculoskeletal:  No neck pain, back pain Integumentary: No rash, pruritus, skin lesions Neurological: as above Psychiatric: No depression, insomnia, anxiety Endocrine: No palpitations, fatigue, diaphoresis, mood swings, change in appetite, change in weight, increased thirst Hematologic/Lymphatic:  No purpura, petechiae. Allergic/Immunologic: no itchy/runny eyes, nasal congestion, recent allergic reactions, rashes  PHYSICAL EXAM: Blood pressure 118/68, pulse 60, height 5\' 8"  (1.727 m), weight 160 lb (72.6 kg), SpO2 95 %. General: No acute distress.  Patient appears well-groomed.  Head:  Normocephalic/atraumatic Eyes:  fundi examined but not visualized Neck: supple, no paraspinal tenderness, full range of motion Back: No paraspinal tenderness Heart: regular rate and rhythm Lungs: Clear to auscultation bilaterally. Vascular: No carotid bruits. Neurological Exam: Mental status: alert and oriented to person, place, and time, recent and remote memory intact, fund of knowledge intact, attention and concentration intact, speech fluent and not dysarthric, language intact. Cranial  nerves: CN I: not tested CN II: pupils equal, round and reactive to light, visual fields intact CN III,  IV, VI:  full range of motion, no nystagmus, no ptosis CN V: facial sensation intact CN VII: upper and lower face symmetric CN VIII: hearing intact CN IX, X: gag intact, uvula midline CN XI: sternocleidomastoid and trapezius muscles intact CN XII: tongue midline Bulk & Tone: normal, no fasciculations. Motor:  5/5 throughout  Sensation:  Pinprick sensation intact and vibration sensation reduced in left big toe. Deep Tendon Reflexes:  2+ throughout, toes downgoing.  Finger to nose testing:  Without dysmetria.  Heel to shin:  Without dysmetria.  Gait:  Normal station and stride.  Able to turn and tandem walk. Romberg negative.  IMPRESSION: 1.  Ocular migraines.  Increased frequency possible triggered by increased emotional stress. 2.  Cerebrovascular disease in 77 year old woman with history of hypertension and hyperlipidemia  PLAN: 1.  She does not wish to treat migraines with a daily preventative as they are manageable and do not bother her. 2.  Recommend taking ASA 81mg  daily 3.  Continue blood pressure control 4.  Continue statin therapy 5.  Mediterranean diet 6.  Follow up as needed.  Thank you for allowing me to take part in the care of this patient.  Metta Clines, DO  CC: Roma Schanz, DO

## 2018-01-10 ENCOUNTER — Encounter: Payer: Self-pay | Admitting: Neurology

## 2018-01-10 ENCOUNTER — Ambulatory Visit: Payer: Medicare Other | Admitting: Neurology

## 2018-01-10 VITALS — BP 118/68 | HR 60 | Ht 68.0 in | Wt 160.0 lb

## 2018-01-10 DIAGNOSIS — I10 Essential (primary) hypertension: Secondary | ICD-10-CM

## 2018-01-10 DIAGNOSIS — G43109 Migraine with aura, not intractable, without status migrainosus: Secondary | ICD-10-CM

## 2018-01-10 DIAGNOSIS — I679 Cerebrovascular disease, unspecified: Secondary | ICD-10-CM

## 2018-01-10 DIAGNOSIS — E785 Hyperlipidemia, unspecified: Secondary | ICD-10-CM | POA: Diagnosis not present

## 2018-01-10 NOTE — Patient Instructions (Signed)
1.  Recommend taking aspirin 81mg  daily 2.  Continue cholesterol and blood pressure medications 3.  Recommend Mediterranean diet (see below) 4.  Follow up as needed   Mediterranean Diet A Mediterranean diet refers to food and lifestyle choices that are based on the traditions of countries located on the [employer]. This way of eating has been shown to help prevent certain conditions and improve outcomes for people who have chronic diseases, like kidney disease and heart disease. What are tips for following this plan? Lifestyle  Cook and eat meals together with your family, when possible.  Drink enough fluid to keep your urine clear or pale yellow.  Be physically active every day. This includes: ? Aerobic exercise like running or swimming. ? Leisure activities like gardening, walking, or housework.  Get 7-8 hours of sleep each night.  If recommended by your health care provider, drink red wine in moderation. This means 1 glass a day for nonpregnant women and 2 glasses a day for men. A glass of wine equals 5 oz (150 mL). Reading food labels  Check the serving size of packaged foods. For foods such as rice and pasta, the serving size refers to the amount of cooked product, not dry.  Check the total fat in packaged foods. Avoid foods that have saturated fat or trans fats.  Check the ingredients list for added sugars, such as corn syrup. Shopping  At the grocery store, buy most of your food from the areas near the walls of the store. This includes: ? Fresh fruits and vegetables (produce). ? Grains, beans, nuts, and seeds. Some of these may be available in unpackaged forms or large amounts (in bulk). ? Fresh seafood. ? Poultry and eggs. ? Low-fat dairy products.  Buy whole ingredients instead of prepackaged foods.  Buy fresh fruits and vegetables in-season from local farmers markets.  Buy frozen fruits and vegetables in resealable bags.  If you do not have access to  quality fresh seafood, buy precooked frozen shrimp or canned fish, such as tuna, salmon, or sardines.  Buy small amounts of raw or cooked vegetables, salads, or olives from the deli or salad bar at your store.  Stock your pantry so you always have certain foods on hand, such as olive oil, canned tuna, canned tomatoes, rice, pasta, and beans. Cooking  Cook foods with extra-virgin olive oil instead of using butter or other vegetable oils.  Have meat as a side dish, and have vegetables or grains as your main dish. This means having meat in small portions or adding small amounts of meat to foods like pasta or stew.  Use beans or vegetables instead of meat in common dishes like chili or lasagna.  Experiment with different cooking methods. Try roasting or broiling vegetables instead of steaming or sauteing them.  Add frozen vegetables to soups, stews, pasta, or rice.  Add nuts or seeds for added healthy fat at each meal. You can add these to yogurt, salads, or vegetable dishes.  Marinate fish or vegetables using olive oil, lemon juice, garlic, and fresh herbs. Meal planning  Plan to eat 1 vegetarian meal one day each week. Try to work up to 2 vegetarian meals, if possible.  Eat seafood 2 or more times a week.  Have healthy snacks readily available, such as: ? Vegetable sticks with hummus. ? [place] yogurt. ? Fruit and nut trail mix.  Eat balanced meals throughout the week. This includes: ? Fruit: 2-3 servings a day ? Vegetables: 4-5 servings a  day ? Low-fat dairy: 2 servings a day ? Fish, poultry, or lean meat: 1 serving a day ? Beans and legumes: 2 or more servings a week ? Nuts and seeds: 1-2 servings a day ? Whole grains: 6-8 servings a day ? Extra-virgin olive oil: 3-4 servings a day  Limit red meat and sweets to only a few servings a month What are my food choices?  Mediterranean diet ? Recommended ? Grains: Whole-grain pasta. Brown rice. Bulgar wheat. Polenta. Couscous.  Whole-wheat bread. [place]. ? Vegetables: Artichokes. Beets. Broccoli. Cabbage. Carrots. Eggplant. Green beans. Chard. Kale. Spinach. Onions. Leeks. Peas. Squash. Tomatoes. Peppers. Radishes. ? Fruits: Apples. Apricots. Avocado. Berries. Bananas. Cherries. Dates. Figs. Grapes. Lemons. Melon. Oranges. Peaches. Plums. Pomegranate. ? Meats and other protein foods: Beans. Almonds. Sunflower seeds. Pine nuts. Peanuts. [place]. Salmon. Scall[place]. Shrimp. [place]. Tilapia. Clams. Oysters. Eggs. ? Dairy: Low-fat milk. Cheese. Greek yogurt. ? Beverages: Water. Red wine. Herbal tea. ? Fats and oils: Extra virgin olive oil. Avocado oil. Grape seed oil. ? Sweets and desserts: [place] yogurt with honey. Baked apples. Poached pears. Trail mix. ? Seasoning and other foods: Basil. Cilantro. Coriander. Cumin. Mint. Parsley. Sage. Rosemary. Tarragon. Garlic. Oregano. Thyme. Pepper. Balsalmic vinegar. Tahini. Hummus. Tomato sauce. Olives. Mushrooms. ? Limit these ? Grains: Prepackaged pasta or rice dishes. Prepackaged cereal with added sugar. ? Vegetables: Deep fried potatoes (french fries). ? Fruits: Fruit canned in syrup. ? Meats and other protein foods: Beef. Pork. Lamb. Poultry with skin. Hot dogs. [place] Salines. ? Dairy: Ice cream. Sour cream. Whole milk. ? Beverages: Juice. Sugar-sweetened soft drinks. Beer. Liquor and spirits. ? Fats and oils: Butter. Canola oil. Vegetable oil. Beef fat (tallow). Lard. ? Sweets and desserts: Cookies. Cakes. Pies. Candy. ? Seasoning and other foods: Mayonnaise. Premade sauces and marinades. ? The items listed may not be a complete list. Talk with your dietitian about what dietary choices are right for you. Summary  The Mediterranean diet includes both food and lifestyle choices.  Eat a variety of fresh fruits and vegetables, beans, nuts, seeds, and whole grains.  Limit the amount of red meat and sweets that you eat.  Talk with your health care provider about whether it is safe  for you to drink red wine in moderation. This means 1 glass a day for nonpregnant women and 2 glasses a day for men. A glass of wine equals 5 oz (150 mL). This information is not intended to replace advice given to you by your health care provider. Make sure you discuss any questions you have with your health care provider. Document Released: 12/15/2015 Document Revised: 01/17/2016 Document Reviewed: 12/15/2015 Elsevier Interactive Patient Education  [employer].

## 2018-01-21 ENCOUNTER — Other Ambulatory Visit: Payer: Self-pay | Admitting: Family Medicine

## 2018-01-21 DIAGNOSIS — Z1231 Encounter for screening mammogram for malignant neoplasm of breast: Secondary | ICD-10-CM

## 2018-01-22 ENCOUNTER — Other Ambulatory Visit: Payer: Self-pay | Admitting: Family Medicine

## 2018-01-22 DIAGNOSIS — E785 Hyperlipidemia, unspecified: Secondary | ICD-10-CM

## 2018-01-22 DIAGNOSIS — I10 Essential (primary) hypertension: Secondary | ICD-10-CM

## 2018-01-22 DIAGNOSIS — M158 Other polyosteoarthritis: Secondary | ICD-10-CM

## 2018-02-03 DIAGNOSIS — N3000 Acute cystitis without hematuria: Secondary | ICD-10-CM | POA: Diagnosis not present

## 2018-02-04 ENCOUNTER — Other Ambulatory Visit: Payer: Self-pay | Admitting: Family Medicine

## 2018-02-04 ENCOUNTER — Ambulatory Visit: Payer: Medicare Other | Admitting: Family Medicine

## 2018-02-04 ENCOUNTER — Encounter: Payer: Self-pay | Admitting: Family Medicine

## 2018-02-04 VITALS — BP 126/78 | HR 66 | Temp 98.4°F | Resp 16 | Ht 68.0 in | Wt 162.4 lb

## 2018-02-04 DIAGNOSIS — G894 Chronic pain syndrome: Secondary | ICD-10-CM | POA: Diagnosis not present

## 2018-02-04 DIAGNOSIS — G4700 Insomnia, unspecified: Secondary | ICD-10-CM

## 2018-02-04 DIAGNOSIS — Z79899 Other long term (current) drug therapy: Secondary | ICD-10-CM | POA: Diagnosis not present

## 2018-02-04 MED ORDER — OXYCODONE-ACETAMINOPHEN 5-325 MG PO TABS: 1.0000 | ORAL_TABLET | Freq: Three times a day (TID) | ORAL | 0 refills | Status: DC | PRN

## 2018-02-04 MED ORDER — ZOLPIDEM TARTRATE 5 MG PO TABS: 5.0000 mg | ORAL_TABLET | Freq: Every evening | ORAL | 3 refills | Status: DC | PRN

## 2018-02-04 MED ORDER — OXYCODONE-ACETAMINOPHEN 5-325 MG PO TABS: 1.0000 | ORAL_TABLET | ORAL | 0 refills | Status: DC | PRN

## 2018-02-04 NOTE — Patient Instructions (Signed)

## 2018-02-04 NOTE — Addendum Note (Signed)
Addended by: Kem Boroughs D on: 02/04/2018 05:31 PM   Modules accepted: Orders

## 2018-02-04 NOTE — Progress Notes (Signed)
Patient ID: Amanda Joyce, female    DOB: [birth year]  Age: 77 y.o. MRN: [id number]    Subjective:  Subjective  HPI [name] presents for f/u pain meds and insomnia  Review of Systems  Constitutional: Negative for chills and fever.  HENT: Negative for congestion and hearing loss.   Eyes: Negative for discharge.  Respiratory: Negative for cough and shortness of breath.   Cardiovascular: Negative for chest pain, palpitations and leg swelling.  Gastrointestinal: Negative for abdominal pain, blood in stool, constipation, diarrhea, nausea and vomiting.  Genitourinary: Negative for dysuria, frequency, hematuria and urgency.  Musculoskeletal: Positive for arthralgias. Negative for back pain and myalgias.  Skin: Negative for rash.  Allergic/Immunologic: Negative for environmental allergies.  Neurological: Negative for dizziness, weakness and headaches.  Hematological: Does not bruise/bleed easily.  Psychiatric/Behavioral: Negative for suicidal ideas. The patient is not nervous/anxious.     History Past Medical History:  Diagnosis Date  . Anxiety   . Arthritis    neck, shoulders, back, knees   . Breast cancer ([place])    Left   . Depression   . GERD (gastroesophageal reflux disease)   . Gout   . Hiatal hernia   . History of stress test >10 yrs. ago   in [place], [place] - no need for follow up  . Hyperlipemia   . Hypertension   . Irritable bowel syndrome   . Nontoxic uninodular goiter   . Osteoarthrosis, unspecified whether generalized or localized, unspecified site   . Personal history of malignant neoplasm of breast   . Temporomandibular joint disorders, unspecified     She has a past surgical history that includes Appendectomy; Hernia repair; Abdominal hysterectomy; Thyroid surgery; Eye surgery (Bilateral); Total knee arthroplasty (Right, 08/04/2014); and Breast lumpectomy.   Her family history includes Alcohol abuse in her paternal uncle; Coronary artery disease in her father;  Liver cancer in her paternal uncle; Other in her mother.She reports that she has never smoked. She has never used smokeless tobacco. She reports that she does not drink alcohol or use drugs.  Current Outpatient Medications on File Prior to Visit  Medication Sig Dispense Refill  . ALPRAZolam (XANAX) 0.25 MG tablet Take 1 tablet (0.25 mg total) by mouth 2 (two) times daily as needed for anxiety. 20 tablet 0  . azelastine (ASTELIN) 0.1 % nasal spray Place 1 spray into both nostrils 2 (two) times daily. Use in each nostril as directed 30 mL 12  . b complex vitamins capsule Take 1 capsule by mouth daily.    . Biotin 5000 MCG CAPS Take 5,000 mcg by mouth daily.    . Cholecalciferol (VITAMIN D3) 2000 UNITS capsule Take 4,000 Units by mouth daily.    . Cranberry 500 MG CAPS Take by mouth.    . desvenlafaxine (PRISTIQ) 100 MG 24 hr tablet Take 1 tablet (100 mg total) by mouth daily.    . Diclofenac Sodium (PENNSAID) 2 % SOLN Place onto the skin. Apply to knee daily as needed    . diltiazem (CARTIA XT) 240 MG 24 hr capsule TAKE ONE CAPSULE BY MOUTH EVERY DAY 90 capsule 1  . fenofibrate 160 MG tablet Take 1 tablet (160 mg total) by mouth daily. 30 tablet 3  . fluticasone (FLONASE) 50 MCG/ACT nasal spray Place 1 spray into both nostrils 2 (two) times daily. 16 g 11  . ibuprofen (ADVIL,MOTRIN) 800 MG tablet TAKE 1 TABLET BY MOUTH EVERY 8 HOURS AS NEEDED 90 tablet 0  . levocetirizine (XYZAL)  5 MG tablet Take 1 tablet (5 mg total) by mouth every evening. 30 tablet 5  . metoprolol tartrate (LOPRESSOR) 25 MG tablet TAKE 1 TABLET BY MOUTH TWICE DAILY. GENERIC EQUIVALENT FOR LOPRESSOR. 180 tablet 1  . omega-3 acid ethyl esters (LOVAZA) 1 g capsule Take 2 capsules twice daily by mouth 120 capsule 2  . omeprazole (PRILOSEC) 40 MG capsule Take 1 capsule (40 mg total) by mouth daily. 30 capsule 2  . phenazopyridine (PYRIDIUM) 100 MG tablet Take 1 tablet (100 mg total) by mouth 3 (three) times daily as needed for pain  (with food). 10 tablet 0  . pravastatin (PRAVACHOL) 20 MG tablet TAKE 1 TABLET BY MOUTH DAILY GENERIC EQUIVALENT FOR PRAVACHOL. 90 tablet 1  . quinapril (ACCUPRIL) 40 MG tablet TAKE 1 TABLET BY MOUTH DAILY. 90 tablet 1  . triamcinolone cream (KENALOG) 0.1 % APPLY  CREAM EXTERNALLY TO AFFECTED AREA TWICE DAILY 30 g 1  . UNABLE TO FIND Med Name: B100 vitamin    . zolpidem (AMBIEN) 5 MG tablet Take 1 tablet (5 mg total) by mouth at bedtime as needed. for sleep 30 tablet 3   No current facility-administered medications on file prior to visit.      Objective:  Objective  Physical Exam  Constitutional: She is oriented to person, place, and time. She appears well-developed and well-nourished.  HENT:  Head: Normocephalic and atraumatic.  Eyes: Conjunctivae and EOM are normal.  Neck: Normal range of motion. Neck supple. No JVD present. Carotid bruit is not present. No thyromegaly present.  Cardiovascular: Normal rate, regular rhythm and normal heart sounds.  No murmur heard. Pulmonary/Chest: Effort normal and breath sounds normal. No respiratory distress. She has no wheezes. She has no rales. She exhibits no tenderness.  Musculoskeletal: She exhibits no edema.  Neurological: She is alert and oriented to person, place, and time.  Psychiatric: She has a normal mood and affect.  Nursing note and vitals reviewed.  BP 126/78 (BP Location: Right Arm, Cuff Size: Large)   Pulse 66   Temp 98.4 F (36.9 C) (Oral)   Resp 16   Ht 5\' 8"  (1.727 m)   Wt 162 lb 6.4 oz (73.7 kg)   SpO2 97%   BMI 24.69 kg/m  Wt Readings from Last 3 Encounters:  02/04/18 162 lb 6.4 oz (73.7 kg)  01/10/18 160 lb (72.6 kg)  12/16/17 161 lb (73 kg)     Lab Results  Component Value Date   WBC 8.3 11/11/2017   HGB 14.2 11/11/2017   HCT 42.2 11/11/2017   PLT 341.0 11/11/2017   GLUCOSE 112 (H) 11/11/2017   CHOL 169 11/11/2017   TRIG 170.0 (H) 11/11/2017   HDL 48.10 11/11/2017   LDLDIRECT 106.0 01/02/2016   LDLCALC  87 11/11/2017   ALT 11 11/11/2017   AST 18 11/11/2017   NA 139 11/11/2017   K 4.1 11/11/2017   CL 101 11/11/2017   CREATININE 0.86 11/11/2017   BUN 19 11/11/2017   CO2 31 11/11/2017   TSH 1.77 02/07/2017   INR 1.04 07/23/2014   HGBA1C 6.1 07/03/2011    Mr Brain Wo Contrast  Result Date: 11/15/2017 CLINICAL DATA:  Headaches and blurred vision over the last 6 months. EXAM: MRI HEAD WITHOUT CONTRAST TECHNIQUE: Multiplanar, multiecho pulse sequences of the brain and surrounding structures were obtained without intravenous contrast. COMPARISON:  08/17/2009 FINDINGS: Brain: Diffusion imaging does not show any acute or subacute infarction. There are extensive chronic small-vessel ischemic changes affecting the pons,  thalami and cerebral hemispheric deep and subcortical white matter, considerably progressive since 2011. No cortical or large vessel territory infarction. No mass lesion, hemorrhage, hydrocephalus or extra-axial collection. Vascular: Major vessels at the base of the brain show flow. Skull and upper cervical spine: Negative Sinuses/Orbits: Minimal mucosal inflammation of the sinuses, not likely significant. Orbits negative. Other: None IMPRESSION: No cause of headache identified. No specific cause of blurred vision. The patient does have chronic small-vessel ischemic changes affecting the pons, thalami and cerebral hemispheric white matter, rather considerably progressive since 2011. No stroke appears acute or subacute. Electronically Signed   By: Nelson Chimes M.D.   On: 11/15/2017 14:17     Assessment & Plan:  Plan  I have discontinued Pamala Hurry H. Kuan's ciprofloxacin. I am also having her maintain her Vitamin D3, b complex vitamins, Biotin, desvenlafaxine, Diclofenac Sodium, omeprazole, levocetirizine, azelastine, omega-3 acid ethyl esters, Cranberry, UNABLE TO FIND, triamcinolone cream, ALPRAZolam, zolpidem, fenofibrate, fluticasone, phenazopyridine, diltiazem, quinapril, metoprolol  tartrate, pravastatin, ibuprofen, oxyCODONE-acetaminophen, oxyCODONE-acetaminophen, and oxyCODONE-acetaminophen.  Meds ordered this encounter  Medications  . oxyCODONE-acetaminophen (ROXICET) 5-325 MG tablet    Sig: Take 1 tablet by mouth every 8 (eight) hours as needed for severe pain.    Dispense:  90 tablet    Refill:  0    Do not fill until dec 2019  . oxyCODONE-acetaminophen (ROXICET) 5-325 MG tablet    Sig: Take 1 tablet by mouth every 8 (eight) hours as needed for severe pain.    Dispense:  90 tablet    Refill:  0    Do not fill until Novt 2019  . oxyCODONE-acetaminophen (PERCOCET/ROXICET) 5-325 MG tablet    Sig: Take 1 tablet by mouth every 4 (four) hours as needed for severe pain.    Dispense:  90 tablet    Refill:  0    Problem List Items Addressed This Visit      Unprioritized   Chronic pain syndrome   Relevant Medications   oxyCODONE-acetaminophen (ROXICET) 5-325 MG tablet   oxyCODONE-acetaminophen (ROXICET) 5-325 MG tablet   oxyCODONE-acetaminophen (PERCOCET/ROXICET) 5-325 MG tablet    uds and contract utd   Follow-up: Return in about 3 months (around 05/07/2018), or if symptoms worsen or fail to improve.  Ann Held, DO

## 2018-02-06 ENCOUNTER — Ambulatory Visit: Payer: Medicare Other | Admitting: Family Medicine

## 2018-02-06 LAB — PAIN MGMT, PROFILE 8 W/CONF, U
6 Acetylmorphine: NEGATIVE ng/mL (ref <10)
AMPHETAMINES: NEGATIVE ng/mL (ref <500)
Alcohol Metabolites: NEGATIVE ng/mL (ref <500)
BENZODIAZEPINES: NEGATIVE ng/mL (ref <100)
BUPRENORPHINE, URINE: NEGATIVE ng/mL (ref <5)
COCAINE METABOLITE: NEGATIVE ng/mL (ref <150)
CREATININE: 90.1 mg/dL
Codeine: NEGATIVE ng/mL (ref <50)
Hydrocodone: NEGATIVE ng/mL (ref <50)
Hydromorphone: NEGATIVE ng/mL (ref <50)
MARIJUANA METABOLITE: NEGATIVE ng/mL (ref <20)
MDMA: NEGATIVE ng/mL (ref <500)
MORPHINE: NEGATIVE ng/mL (ref <50)
NORHYDROCODONE: NEGATIVE ng/mL (ref <50)
Noroxycodone: 905 ng/mL — ABNORMAL HIGH (ref <50)
OPIATES: NEGATIVE ng/mL (ref <100)
OXIDANT: NEGATIVE ug/mL (ref <200)
OXYMORPHONE: 2051 ng/mL — AB (ref <50)
Oxycodone: 724 ng/mL — ABNORMAL HIGH (ref <50)
Oxycodone: POSITIVE ng/mL — AB (ref <100)
PH: 6.8 (ref 4.5–9.0)

## 2018-02-19 NOTE — Telephone Encounter (Signed)
Patient is calling in for a refill of her desvenlafaxine (PRISTIQ) 100 MG 24 hr tablet she stated that it is normally ordered and delivered to office for her to pick up

## 2018-02-20 NOTE — Telephone Encounter (Signed)
Medication reordered should be here in 7-10 business days  Next order is 04/29/18

## 2018-02-26 ENCOUNTER — Telehealth: Payer: Self-pay | Admitting: *Deleted

## 2018-02-26 NOTE — Telephone Encounter (Signed)
Left message with gentleman that Pristiq is ready for pickup.  Medication up front for pickup.

## 2018-02-27 ENCOUNTER — Ambulatory Visit
Admission: RE | Admit: 2018-02-27 | Discharge: 2018-02-27 | Disposition: A | Discharge status: Home / Self Care | Payer: Medicare Other | Source: Ambulatory Visit | Attending: Family Medicine | Admitting: Family Medicine

## 2018-02-27 DIAGNOSIS — Z1231 Encounter for screening mammogram for malignant neoplasm of breast: Secondary | ICD-10-CM | POA: Diagnosis not present

## 2018-03-03 ENCOUNTER — Ambulatory Visit (INDEPENDENT_AMBULATORY_CARE_PROVIDER_SITE_OTHER): Payer: Medicare Other | Admitting: Podiatry

## 2018-03-03 ENCOUNTER — Encounter: Payer: Self-pay | Admitting: Podiatry

## 2018-03-03 DIAGNOSIS — B351 Tinea unguium: Secondary | ICD-10-CM

## 2018-03-03 DIAGNOSIS — G629 Polyneuropathy, unspecified: Secondary | ICD-10-CM | POA: Diagnosis not present

## 2018-03-10 NOTE — Progress Notes (Signed)
Subjective:   Patient ID: Amanda Joyce, female   DOB: 77 y.o.   MRN: [id number]   HPI 77 year old female presents the office today for toenail issues.  She states that mostly both of her big toenails are discolored and the growth up thicker.  She states that the patient has some occasional discomfort to get thick but she denies any redness or drainage or any swelling.  This is been ongoing since she had chemotherapy about 13 years ago for breast cancer.  Also since that she has had a weird feeling to the bottom of her foot almost a dry kind of feeling.  The pain does not wake her up at night and she denies any sharp shooting pains.   Review of Systems  All other systems reviewed and are negative.  Past Medical History:  Diagnosis Date  . Anxiety   . Arthritis    neck, shoulders, back, knees   . Breast cancer ([place])    Left   . Depression   . GERD (gastroesophageal reflux disease)   . Gout   . Hiatal hernia   . History of stress test >10 yrs. ago   in [place], [place] - no need for follow up  . Hyperlipemia   . Hypertension   . Irritable bowel syndrome   . Nontoxic uninodular goiter   . Osteoarthrosis, unspecified whether generalized or localized, unspecified site   . Personal history of malignant neoplasm of breast   . Temporomandibular joint disorders, unspecified     Past Surgical History:  Procedure Laterality Date  . ABDOMINAL HYSTERECTOMY    . APPENDECTOMY    . BREAST LUMPECTOMY     Left breast X2  . EYE SURGERY Bilateral    /w iol  . HERNIA REPAIR    . THYROID SURGERY    . TOTAL KNEE ARTHROPLASTY Right 08/04/2014   Procedure: RIGHT TOTAL KNEE ARTHROPLASTY;  Surgeon: Kathryne Hitch, MD;  Location: [facility];  Service: Orthopedics;  Laterality: Right;     Current Outpatient Medications:  .  NON FORMULARY, [facility]  Onychomycosis Nail Lacquer -  Fluconazole 2%, Terbinafine 1% DMSO/undecylenic acid 25% Apply to affected nail once daily Qty. 120 gm 3 refills, Disp: ,  Rfl:  .  ALPRAZolam (XANAX) 0.25 MG tablet, Take 1 tablet (0.25 mg total) by mouth 2 (two) times daily as needed for anxiety., Disp: 20 tablet, Rfl: 0 .  azelastine (ASTELIN) 0.1 % nasal spray, Place 1 spray into both nostrils 2 (two) times daily. Use in each nostril as directed, Disp: 30 mL, Rfl: 12 .  b complex vitamins capsule, Take 1 capsule by mouth daily., Disp: , Rfl:  .  Biotin 5000 MCG CAPS, Take 5,000 mcg by mouth daily., Disp: , Rfl:  .  Cholecalciferol (VITAMIN D3) 2000 UNITS capsule, Take 4,000 Units by mouth daily., Disp: , Rfl:  .  Cranberry 500 MG CAPS, Take by mouth., Disp: , Rfl:  .  desvenlafaxine (PRISTIQ) 100 MG 24 hr tablet, Take 1 tablet (100 mg total) by mouth daily., Disp: , Rfl:  .  Diclofenac Sodium (PENNSAID) 2 % SOLN, Place onto the skin. Apply to knee daily as needed, Disp: , Rfl:  .  diltiazem (CARTIA XT) 240 MG 24 hr capsule, TAKE ONE CAPSULE BY MOUTH EVERY DAY, Disp: 90 capsule, Rfl: 1 .  fenofibrate 160 MG tablet, Take 1 tablet (160 mg total) by mouth daily., Disp: 30 tablet, Rfl: 3 .  fluticasone (FLONASE) 50 MCG/ACT nasal spray, Place 1  spray into both nostrils 2 (two) times daily., Disp: 16 g, Rfl: 11 .  ibuprofen (ADVIL,MOTRIN) 800 MG tablet, TAKE 1 TABLET BY MOUTH EVERY 8 HOURS AS NEEDED, Disp: 90 tablet, Rfl: 0 .  levocetirizine (XYZAL) 5 MG tablet, Take 1 tablet (5 mg total) by mouth every evening., Disp: 30 tablet, Rfl: 5 .  metoprolol tartrate (LOPRESSOR) 25 MG tablet, TAKE 1 TABLET BY MOUTH TWICE DAILY. GENERIC EQUIVALENT FOR LOPRESSOR., Disp: 180 tablet, Rfl: 1 .  omega-3 acid ethyl esters (LOVAZA) 1 g capsule, Take 2 capsules twice daily by mouth, Disp: 120 capsule, Rfl: 2 .  omeprazole (PRILOSEC) 40 MG capsule, Take 1 capsule (40 mg total) by mouth daily., Disp: 30 capsule, Rfl: 2 .  oxyCODONE-acetaminophen (PERCOCET/ROXICET) 5-325 MG tablet, Take 1 tablet by mouth every 8 (eight) hours as needed for severe pain., Disp: 90 tablet, Rfl: 0 .   oxyCODONE-acetaminophen (ROXICET) 5-325 MG tablet, Take 1 tablet by mouth every 8 (eight) hours as needed for severe pain., Disp: 90 tablet, Rfl: 0 .  oxyCODONE-acetaminophen (ROXICET) 5-325 MG tablet, Take 1 tablet by mouth every 8 (eight) hours as needed for severe pain., Disp: 90 tablet, Rfl: 0 .  phenazopyridine (PYRIDIUM) 100 MG tablet, Take 1 tablet (100 mg total) by mouth 3 (three) times daily as needed for pain (with food)., Disp: 10 tablet, Rfl: 0 .  pravastatin (PRAVACHOL) 20 MG tablet, TAKE 1 TABLET BY MOUTH DAILY GENERIC EQUIVALENT FOR PRAVACHOL., Disp: 90 tablet, Rfl: 1 .  quinapril (ACCUPRIL) 40 MG tablet, TAKE 1 TABLET BY MOUTH DAILY., Disp: 90 tablet, Rfl: 1 .  triamcinolone cream (KENALOG) 0.1 %, APPLY  CREAM EXTERNALLY TO AFFECTED AREA TWICE DAILY, Disp: 30 g, Rfl: 1 .  UNABLE TO FIND, Med Name: B100 vitamin, Disp: , Rfl:  .  zolpidem (AMBIEN) 5 MG tablet, Take 1 tablet (5 mg total) by mouth at bedtime as needed. for sleep, Disp: 30 tablet, Rfl: 3  Allergies  Allergen Reactions  . Clarithromycin     REACTION: NIGHTMARES  . Influenza Vac Split Quad Nausea And Vomiting  . Penicillins     REACTION: ITCHING  . Soma [Carisoprodol] Other (See Comments)    nightmares         Objective:  Physical Exam  General: AAO x3, NAD  Dermatological: Bilateral hallux toes are hypertrophic, dystrophic with yellow discoloration.  There is no pain in the nails today there is no surrounding redness or drainage or any signs of infection.  No open lesions.  Vascular: Dorsalis Pedis artery and Posterior Tibial artery pedal pulses are 2/4 bilateral with immedate capillary fill time. . There is no pain with calf compression, swelling, warmth, erythema.   Neruologic: Grossly intact via light touch bilateral. Protective threshold with Semmes Wienstein monofilament intact to all pedal sites bilateral.   Musculoskeletal: No gross boney pedal deformities bilateral. No pain, crepitus, or limitation  noted with foot and ankle range of motion bilateral. Muscular strength 5/5 in all groups tested bilateral.  Gait: Unassisted, Nonantalgic.     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  She is very difficult to treat them at this point she was to try.  I prescribed topical ointment for nail fungus as well as to help thin the nails from [place].  -Likely has some mild neuropathy symptoms but  she wishes to hold off on medication at this point.    Trula Slade DPM

## 2018-04-09 ENCOUNTER — Telehealth: Payer: Self-pay

## 2018-04-09 NOTE — Telephone Encounter (Signed)
PA approved. Effective from 04/09/2018 through 04/10/2019.

## 2018-04-09 NOTE — Telephone Encounter (Signed)
PA initiated via Covermymeds; KEY: [id number]. Awaiting determination.

## 2018-04-10 NOTE — Telephone Encounter (Signed)
Ria with [employer] returning call to [place] regarding PA.   Ria cb# [phone]

## 2018-04-28 ENCOUNTER — Encounter: Payer: Self-pay | Admitting: Family Medicine

## 2018-04-28 ENCOUNTER — Ambulatory Visit: Payer: Medicare Other | Admitting: Family Medicine

## 2018-04-28 DIAGNOSIS — G4700 Insomnia, unspecified: Secondary | ICD-10-CM | POA: Diagnosis not present

## 2018-04-28 DIAGNOSIS — R1011 Right upper quadrant pain: Secondary | ICD-10-CM

## 2018-04-28 DIAGNOSIS — G894 Chronic pain syndrome: Secondary | ICD-10-CM

## 2018-04-28 MED ORDER — OXYCODONE-ACETAMINOPHEN 5-325 MG PO TABS: 1.0000 | ORAL_TABLET | Freq: Three times a day (TID) | ORAL | 0 refills | Status: DC | PRN

## 2018-04-28 MED ORDER — ZOLPIDEM TARTRATE 5 MG PO TABS: 5.0000 mg | ORAL_TABLET | Freq: Every evening | ORAL | 3 refills | Status: DC | PRN

## 2018-04-28 MED ORDER — OMEPRAZOLE 40 MG PO CPDR: 40.0000 mg | DELAYED_RELEASE_CAPSULE | Freq: Every day | ORAL | 2 refills | Status: AC

## 2018-04-28 NOTE — Patient Instructions (Signed)
Opioid Pain Medicine Information  Opioid pain medicines are strong medicines that are used to treat serious pain. Only take these medicines while you are working with a doctor. You should only take them for short amounts of time, if your doctor says that you can. When you take these medicines for short amounts of time, they can help you:  · Do better in physical therapy.  · Feel better during the first few days after an injury.  Work with your doctor to make a plan for treating your pain. Talk about:  · How much pain you can expect to have.  · How long you are likely to have pain.  What are the risks?  Opioid pain medicines can cause problems (side effects). Taking them for more than 3 days raises your chance of problems, such as:  · Trouble pooping (constipation).  · Feeling sick to your stomach (nausea).  · Throwing up (vomiting).  · Sleepiness.  · Confusion.  · Breathing problems.  · Not being able to stop taking the medicine (addiction).  Taking opioid pain medicine for a long time can put you at risk for:  · Car accidents.  · Depression.  · Heart attack.  · Taking too much of the medicine (overdose).  · Painful symptoms after you stop the medicine (withdrawal).  · Suicide and death.  Follow these instructions at home:  Safety and storage         · While you are taking opioid pain medicine:  ? Do not drive.  ? Do not use machines or power tools.  ? Do not sign important papers (legal documents).  ? Do not drink alcohol.  ? Do not take sleeping pills.  ? Do not take care of children by yourself.  ? Do not do activities with climbing or being in high places, like working on a ladder.  ? Do not go into any water, such a lake, river, ocean, pool, or hot tub.  · Keep your pain medicine locked up, or in a place where children cannot reach it.  · Do not share your pain medicine with anyone.  Getting rid of leftover pills  Do not save any leftover pills. Get rid of leftover pills safely by:  · Taking them to a take-back  program in your area.  · Bringing them to a pharmacy that has a container for throwing away pills (pill disposal).  · Safely throwing them in the trash. To do this:  1. Mix the pills with pet poop or food scraps.  2. Put the mixture in a closed container or bag.  3. Throw the container or bag in the trash.  General instructions  · Work with your doctor to find other ways to manage your pain, such as:  ? Physical therapy.  ? Massage.  ? Counseling.  ? Diet and exercise.  ? Meditation.  ? Other pain medicines.  · Get your pain medicine prescription from only one doctor.  · If you have been taking opioid pain medicines for more than a few weeks, do not try to stop taking them by yourself. Work with your doctor to stop.  ? Your doctor will help you take less and less (taper) until you are not taking the medicine at all. This can lower your chance of having painful symptoms after you stop taking the medicine.  · Keep all follow-up visits as told by your doctor. This is important.  Contact a doctor if:  · You have   problems because of your medicines.  · You need help to stop taking your medicines.  · You have questions about how to use your medicines safely.  Get help right away if:  · You have trouble breathing.  · You have a very slow heartbeat.  · You feel confused.  · You are very sleepy.  · You pass out (faint).  · You feel sick to your stomach.  · You throw up.  · You have cold skin.  · You have blue lips or fingers.  · Your muscles are weak (limp) and your body seems floppy.  · The black centers of your eyes (pupils) are smaller than normal.  · You feel like you may hurt yourself or others.  If you think that you (or somebody else) may have taken too much of an opioid pain medicine, go to your nearest emergency department or call:  · Your local emergency services (911 in the [place]  · The hotline of the National Poison Control Center (1-800-222-1222 in the [place]  · A suicide crisis helpline, such as the National  Suicide Prevention Lifeline at 1-800-273-8255. This is open 24 hours a day.  Summary  · Opioid pain medicines are strong medicines that can have serious side effects if you take them for too long.  · If you have pain, work with your doctor to make a plan to treat it. If you can, find other options that help you manage pain.  · Get help right away if you think that you (or somebody else) may have taken too much of an opioid pain medicine.  This information is not intended to replace advice given to you by your health care provider. Make sure you discuss any questions you have with your health care provider.  Document Released: 02/12/2017 Document Revised: 01/15/2018 Document Reviewed: 02/12/2017  Elsevier Interactive Patient Education © 2019 [employer]

## 2018-04-28 NOTE — Progress Notes (Signed)
Patient ID: Amanda Joyce, female    DOB: [birth year]  Age: 77 y.o. MRN: [id number]    Subjective:  Subjective  HPI  Amanda Joyce presents for f/u pain meds and ambien.  She has no complaints today.  She is not taking more pain med then needed.  uds / contract utd datatbase reviewed   Review of Systems  Constitutional: Negative for appetite change, diaphoresis, fatigue and unexpected weight change.  Eyes: Negative for pain, redness and visual disturbance.  Respiratory: Negative for cough, chest tightness, shortness of breath and wheezing.   Cardiovascular: Negative for chest pain, palpitations and leg swelling.  Endocrine: Negative for cold intolerance, heat intolerance, polydipsia, polyphagia and polyuria.  Genitourinary: Negative for difficulty urinating, dysuria and frequency.  Neurological: Negative for dizziness, light-headedness, numbness and headaches.    History Past Medical History:  Diagnosis Date  . Anxiety   . Arthritis    neck, shoulders, back, knees   . Breast cancer ([place])    Left   . Depression   . GERD (gastroesophageal reflux disease)   . Gout   . Hiatal hernia   . History of stress test >10 yrs. ago   in [place], [place] - no need for follow up  . Hyperlipemia   . Hypertension   . Irritable bowel syndrome   . Nontoxic uninodular goiter   . Osteoarthrosis, unspecified whether generalized or localized, unspecified site   . Personal history of malignant neoplasm of breast   . Temporomandibular joint disorders, unspecified     She has a past surgical history that includes Appendectomy; Hernia repair; Abdominal hysterectomy; Thyroid surgery; Eye surgery (Bilateral); Total knee arthroplasty (Right, 08/04/2014); and Breast lumpectomy.   Her family history includes Alcohol abuse in her paternal uncle; Coronary artery disease in her father; Liver cancer in her paternal uncle; Other in her mother.She reports that she has never smoked. She has never used smokeless  tobacco. She reports that she does not drink alcohol or use drugs.  Current Outpatient Medications on File Prior to Visit  Medication Sig Dispense Refill  . ALPRAZolam (XANAX) 0.25 MG tablet Take 1 tablet (0.25 mg total) by mouth 2 (two) times daily as needed for anxiety. 20 tablet 0  . azelastine (ASTELIN) 0.1 % nasal spray Place 1 spray into both nostrils 2 (two) times daily. Use in each nostril as directed 30 mL 12  . b complex vitamins capsule Take 1 capsule by mouth daily.    . Biotin 5000 MCG CAPS Take 5,000 mcg by mouth daily.    . Cholecalciferol (VITAMIN D3) 2000 UNITS capsule Take 4,000 Units by mouth daily.    . Cranberry 500 MG CAPS Take by mouth.    . desvenlafaxine (PRISTIQ) 100 MG 24 hr tablet Take 1 tablet (100 mg total) by mouth daily.    . Diclofenac Sodium (PENNSAID) 2 % SOLN Place onto the skin. Apply to knee daily as needed    . diltiazem (CARTIA XT) 240 MG 24 hr capsule TAKE ONE CAPSULE BY MOUTH EVERY DAY 90 capsule 1  . fenofibrate 160 MG tablet Take 1 tablet (160 mg total) by mouth daily. 30 tablet 3  . fluticasone (FLONASE) 50 MCG/ACT nasal spray Place 1 spray into both nostrils 2 (two) times daily. 16 g 11  . ibuprofen (ADVIL,MOTRIN) 800 MG tablet TAKE 1 TABLET BY MOUTH EVERY 8 HOURS AS NEEDED 90 tablet 0  . levocetirizine (XYZAL) 5 MG tablet Take 1 tablet (5 mg total) by mouth every evening.  30 tablet 5  . metoprolol tartrate (LOPRESSOR) 25 MG tablet TAKE 1 TABLET BY MOUTH TWICE DAILY. GENERIC EQUIVALENT FOR LOPRESSOR. 180 tablet 1  . NON FORMULARY [facility]  Onychomycosis Nail Lacquer -  Fluconazole 2%, Terbinafine 1% DMSO/undecylenic acid 25% Apply to affected nail once daily Qty. 120 gm 3 refills    . omega-3 acid ethyl esters (LOVAZA) 1 g capsule Take 2 capsules twice daily by mouth 120 capsule 2  . phenazopyridine (PYRIDIUM) 100 MG tablet Take 1 tablet (100 mg total) by mouth 3 (three) times daily as needed for pain (with food). 10 tablet 0  .  pravastatin (PRAVACHOL) 20 MG tablet TAKE 1 TABLET BY MOUTH DAILY GENERIC EQUIVALENT FOR PRAVACHOL. 90 tablet 1  . quinapril (ACCUPRIL) 40 MG tablet TAKE 1 TABLET BY MOUTH DAILY. 90 tablet 1  . triamcinolone cream (KENALOG) 0.1 % APPLY  CREAM EXTERNALLY TO AFFECTED AREA TWICE DAILY 30 g 1  . UNABLE TO FIND Med Name: B100 vitamin     No current facility-administered medications on file prior to visit.      Objective:  Objective  Physical Exam Constitutional:      Appearance: She is well-developed.  HENT:     Head: Normocephalic and atraumatic.  Eyes:     Conjunctiva/sclera: Conjunctivae normal.  Neck:     Musculoskeletal: Normal range of motion and neck supple.     Thyroid: No thyromegaly.     Vascular: No carotid bruit or JVD.  Cardiovascular:     Rate and Rhythm: Normal rate and regular rhythm.     Heart sounds: Normal heart sounds. No murmur.  Pulmonary:     Effort: Pulmonary effort is normal. No respiratory distress.     Breath sounds: Normal breath sounds. No wheezing or rales.  Chest:     Chest wall: No tenderness.  Neurological:     Mental Status: She is alert and oriented to person, place, and time.    BP 138/76 (BP Location: Right Arm, Cuff Size: Normal)   Pulse 75   Temp 97.8 F (36.6 C) (Oral)   Resp 16   Ht 5\' 8"  (1.727 m)   Wt 167 lb 3.2 oz (75.8 kg)   SpO2 97%   BMI 25.42 kg/m  Wt Readings from Last 3 Encounters:  04/28/18 167 lb 3.2 oz (75.8 kg)  02/04/18 162 lb 6.4 oz (73.7 kg)  01/10/18 160 lb (72.6 kg)     Lab Results  Component Value Date   WBC 8.3 11/11/2017   HGB 14.2 11/11/2017   HCT 42.2 11/11/2017   PLT 341.0 11/11/2017   GLUCOSE 112 (H) 11/11/2017   CHOL 169 11/11/2017   TRIG 170.0 (H) 11/11/2017   HDL 48.10 11/11/2017   LDLDIRECT 106.0 01/02/2016   LDLCALC 87 11/11/2017   ALT 11 11/11/2017   AST 18 11/11/2017   NA 139 11/11/2017   K 4.1 11/11/2017   CL 101 11/11/2017   CREATININE 0.86 11/11/2017   BUN 19 11/11/2017   CO2 31  11/11/2017   TSH 1.77 02/07/2017   INR 1.04 07/23/2014   HGBA1C 6.1 07/03/2011    Mm 3d Screen Breast Bilateral  Result Date: 02/28/2018 CLINICAL DATA:  Screening. EXAM: DIGITAL SCREENING BILATERAL MAMMOGRAM WITH TOMO AND CAD COMPARISON:  Previous exam(s). ACR Breast Density Category c: The breast tissue is heterogeneously dense, which may obscure small masses. FINDINGS: There are no findings suspicious for malignancy. Images were processed with CAD. IMPRESSION: No mammographic evidence of malignancy. A result  letter of this screening mammogram will be mailed directly to the patient. RECOMMENDATION: Screening mammogram in one year. (Code:SM-B-01Y) BI-RADS CATEGORY  1: Negative. Electronically Signed   By: Dorise Bullion III M.D   On: 02/28/2018 14:00     Assessment & Plan:  Plan  I am having Marney Doctor maintain her Vitamin D3, b complex vitamins, Biotin, desvenlafaxine, Diclofenac Sodium, levocetirizine, azelastine, omega-3 acid ethyl esters, Cranberry, UNABLE TO FIND, triamcinolone cream, ALPRAZolam, fenofibrate, fluticasone, phenazopyridine, diltiazem, quinapril, metoprolol tartrate, pravastatin, ibuprofen, NON FORMULARY, omeprazole, zolpidem, oxyCODONE-acetaminophen, oxyCODONE-acetaminophen, and oxyCODONE-acetaminophen.  Meds ordered this encounter  Medications  . omeprazole (PRILOSEC) 40 MG capsule    Sig: Take 1 capsule (40 mg total) by mouth daily.    Dispense:  30 capsule    Refill:  2  . zolpidem (AMBIEN) 5 MG tablet    Sig: Take 1 tablet (5 mg total) by mouth at bedtime as needed. for sleep    Dispense:  30 tablet    Refill:  3    teva brand  . oxyCODONE-acetaminophen (ROXICET) 5-325 MG tablet    Sig: Take 1 tablet by mouth every 8 (eight) hours as needed for severe pain.    Dispense:  90 tablet    Refill:  0    Do not fill until feb 2020  . oxyCODONE-acetaminophen (ROXICET) 5-325 MG tablet    Sig: Take 1 tablet by mouth every 8 (eight) hours as needed for severe pain.     Dispense:  90 tablet    Refill:  0    Do not fill until jan 2020  . oxyCODONE-acetaminophen (PERCOCET/ROXICET) 5-325 MG tablet    Sig: Take 1 tablet by mouth every 8 (eight) hours as needed for severe pain.    Dispense:  90 tablet    Refill:  0    Problem List Items Addressed This Visit      Unprioritized   Chronic pain syndrome   Relevant Medications   oxyCODONE-acetaminophen (ROXICET) 5-325 MG tablet   oxyCODONE-acetaminophen (ROXICET) 5-325 MG tablet   oxyCODONE-acetaminophen (PERCOCET/ROXICET) 5-325 MG tablet   Insomnia   Relevant Medications   zolpidem (AMBIEN) 5 MG tablet    Other Visit Diagnoses    RUQ abdominal pain       Relevant Medications   omeprazole (PRILOSEC) 40 MG capsule    pain stable  Refill meds F/u 3 months   Follow-up: Return in about 3 months (around 07/28/2018).  Ann Held, DO

## 2018-05-13 ENCOUNTER — Ambulatory Visit: Payer: Medicare Other | Admitting: Family Medicine

## 2018-05-28 ENCOUNTER — Telehealth: Payer: Self-pay | Admitting: Family Medicine

## 2018-05-28 DIAGNOSIS — F411 Generalized anxiety disorder: Secondary | ICD-10-CM

## 2018-05-28 MED ORDER — DESVENLAFAXINE SUCCINATE ER 100 MG PO TB24: 100.0000 mg | ORAL_TABLET | Freq: Every day | ORAL | 5 refills | Status: DC

## 2018-05-28 NOTE — Telephone Encounter (Signed)
Copied from [place] [id number]. Topic: Quick Communication - Rx Refill/Question >> May 28, 2018  3:32 PM Bea Graff, NT wrote: Medication: desvenlafaxine (PRISTIQ) 100 MG 24 hr tablet  Has the patient contacted their pharmacy? Yes.   (Agent: If no, request that the patient contact the pharmacy for the refill.) (Agent: If yes, when and what did the pharmacy advise?)  Preferred Pharmacy (with phone number or street name): [place], [place]. [phone] (Phone) [phone] (Fax)    Agent: Please be advised that RX refills may take up to 3 business days. We ask that you follow-up with your pharmacy.

## 2018-05-28 NOTE — Telephone Encounter (Signed)
Requested medication (s) are due for refill today: Yes  Requested medication (s) are on the active medication list: Yes  Last refill:  03/15/16  Future visit scheduled: Yes  Notes to clinic:  Unable to refill, expired     Requested Prescriptions  Pending Prescriptions Disp Refills   desvenlafaxine (PRISTIQ) 100 MG 24 hr tablet      Sig: Take 1 tablet (100 mg total) by mouth daily.     Psychiatry: Antidepressants - SNRI - desvenlafaxine & venlafaxine Failed - 05/28/2018  3:38 PM      Failed - Triglycerides in normal range and within 360 days    Triglycerides  Date Value Ref Range Status  11/11/2017 170.0 (H) 0.0 - 149.0 mg/dL Final    Comment:    Normal:  <150 mg/dLBorderline High:  150 - 199 mg/dL   Triglyceride fasting, serum  Date Value Ref Range Status  04/18/2006 123 0 - 149 mg/dL Final    Comment:    See lab report for associated comment(s)         Passed - LDL in normal range and within 360 days    LDL Cholesterol (Calc)  Date Value Ref Range Status  05/10/2017 85 mg/dL (calc) Final    Comment:    Reference range: <100 . Desirable range <100 mg/dL for primary prevention;   <70 mg/dL for patients with CHD or diabetic patients  with > or = 2 CHD risk factors. [facility] Kitchen LDL-C is now calculated using the Martin-Hopkins  calculation, which is a validated novel method providing  better accuracy than the Friedewald equation in the  estimation of LDL-C.  Cresenciano Genre et al. Annamaria Helling. 8315;176(16): 2061-2068  (http://education.[facility].com/faq/FAQ164)    LDL Cholesterol  Date Value Ref Range Status  11/11/2017 87 0 - 99 mg/dL Final         Passed - Total Cholesterol in normal range and within 360 days    Cholesterol  Date Value Ref Range Status  11/11/2017 169 0 - 200 mg/dL Final    Comment:    ATP III Classification       Desirable:  < 200 mg/dL               Borderline High:  200 - 239 mg/dL          High:  > = 240 mg/dL         Passed - Completed PHQ-2 or  PHQ-9 in the last 360 days.      Passed - Last BP in normal range    BP Readings from Last 1 Encounters:  04/28/18 138/76         Passed - Valid encounter within last 6 months    Recent Outpatient Visits          1 month ago RUQ abdominal pain   [facility], DO   3 months ago High risk medication use   [facility], DO   5 months ago Frequent urination   [facility] [place], Charlene Brooke, NP   5 months ago [place] [facility], DO   6 months ago Preventative health care   [facility] [place], Alferd Apa, [place]      Future Appointments  In 2 months [place], DO [place] [facility]

## 2018-05-29 ENCOUNTER — Emergency Department
Admission: EM | Admit: 2018-05-29 | Discharge: 2018-05-29 | Disposition: A | Discharge status: Home / Self Care | Payer: Medicare Other | Attending: Student in an Organized Health Care Education/Training Program | Admitting: Student in an Organized Health Care Education/Training Program

## 2018-05-29 ENCOUNTER — Emergency Department: Payer: Medicare Other

## 2018-05-29 ENCOUNTER — Other Ambulatory Visit: Payer: Self-pay

## 2018-05-29 DIAGNOSIS — Y939 Activity, unspecified: Secondary | ICD-10-CM | POA: Insufficient documentation

## 2018-05-29 DIAGNOSIS — R0789 Other chest pain: Secondary | ICD-10-CM

## 2018-05-29 DIAGNOSIS — Z79899 Other long term (current) drug therapy: Secondary | ICD-10-CM | POA: Diagnosis not present

## 2018-05-29 DIAGNOSIS — S0990XA Unspecified injury of head, initial encounter: Secondary | ICD-10-CM | POA: Diagnosis not present

## 2018-05-29 DIAGNOSIS — S299XXA Unspecified injury of thorax, initial encounter: Secondary | ICD-10-CM | POA: Diagnosis not present

## 2018-05-29 DIAGNOSIS — I10 Essential (primary) hypertension: Secondary | ICD-10-CM | POA: Insufficient documentation

## 2018-05-29 DIAGNOSIS — S161XXA Strain of muscle, fascia and tendon at neck level, initial encounter: Secondary | ICD-10-CM | POA: Insufficient documentation

## 2018-05-29 DIAGNOSIS — S199XXA Unspecified injury of neck, initial encounter: Secondary | ICD-10-CM | POA: Diagnosis not present

## 2018-05-29 DIAGNOSIS — Y929 Unspecified place or not applicable: Secondary | ICD-10-CM | POA: Insufficient documentation

## 2018-05-29 DIAGNOSIS — Y999 Unspecified external cause status: Secondary | ICD-10-CM | POA: Diagnosis not present

## 2018-05-29 LAB — CBC WITH DIFFERENTIAL/PLATELET
Abs Immature Granulocytes: 0.06 10*3/uL (ref 0.00–0.07)
Basophils Absolute: 0.1 10*3/uL (ref 0.0–0.1)
Basophils Relative: 1 %
Eosinophils Absolute: 0.2 10*3/uL (ref 0.0–0.5)
Eosinophils Relative: 2 %
HCT: 46 % (ref 36.0–46.0)
Hemoglobin: 15.3 g/dL — ABNORMAL HIGH (ref 12.0–15.0)
Immature Granulocytes: 1 %
Lymphocytes Relative: 33 %
Lymphs Abs: 3.4 10*3/uL (ref 0.7–4.0)
MCH: 29.5 pg (ref 26.0–34.0)
MCHC: 33.3 g/dL (ref 30.0–36.0)
MCV: 88.6 fL (ref 80.0–100.0)
MONOS PCT: 7 %
Monocytes Absolute: 0.7 10*3/uL (ref 0.1–1.0)
Neutro Abs: 5.9 10*3/uL (ref 1.7–7.7)
Neutrophils Relative %: 56 %
Platelets: 332 10*3/uL (ref 150–400)
RBC: 5.19 MIL/uL — ABNORMAL HIGH (ref 3.87–5.11)
RDW: 12.7 % (ref 11.5–15.5)
WBC: 10.4 10*3/uL (ref 4.0–10.5)
nRBC: 0 % (ref 0.0–0.2)

## 2018-05-29 LAB — COMPREHENSIVE METABOLIC PANEL
ALT: 18 U/L (ref 0–44)
AST: 32 U/L (ref 15–41)
Albumin: 5 g/dL (ref 3.5–5.0)
Alkaline Phosphatase: 71 U/L (ref 38–126)
Anion gap: 11 (ref 5–15)
BUN: 17 mg/dL (ref 8–23)
CO2: 25 mmol/L (ref 22–32)
CREATININE: 0.73 mg/dL (ref 0.44–1.00)
Calcium: 10.7 mg/dL — ABNORMAL HIGH (ref 8.9–10.3)
Chloride: 104 mmol/L (ref 98–111)
GFR calc Af Amer: >60 mL/min (ref >60)
Glucose, Bld: 110 mg/dL — ABNORMAL HIGH (ref 70–99)
Potassium: 3.5 mmol/L (ref 3.5–5.1)
Sodium: 140 mmol/L (ref 135–145)
Total Bilirubin: 1 mg/dL (ref 0.3–1.2)
Total Protein: 7.8 g/dL (ref 6.5–8.1)

## 2018-05-29 LAB — TROPONIN I

## 2018-05-29 LAB — LIPASE, BLOOD: Lipase: 42 U/L (ref 11–51)

## 2018-05-29 MED ORDER — LIDOCAINE 5 % EX PTCH: 1.0000 | MEDICATED_PATCH | Freq: Two times a day (BID) | CUTANEOUS | 0 refills | Status: AC

## 2018-05-29 MED ORDER — HYDROCODONE-ACETAMINOPHEN 5-325 MG PO TABS
1.0000 | ORAL_TABLET | Freq: Once | ORAL | Status: DC
  Filled 2018-05-29: qty 1
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
## 2018-05-29 NOTE — ED Notes (Signed)
Pt taken to car via wheelchair. NAD noted at this time
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
## 2018-06-02 ENCOUNTER — Telehealth: Payer: Self-pay | Admitting: *Deleted

## 2018-06-02 NOTE — Telephone Encounter (Signed)
Patient states this should never go to [place]. She said that it goes through [employer] and it comes to the office and Dr Carollee Herter lets her pick up at the office because it is free that way.

## 2018-06-02 NOTE — Telephone Encounter (Signed)
Called to reorder pristiq through [facility].  Will be in 7-10 business days.  Patient notified.    Also patient notified to make sure when she calls to say that she need for assistant to call [facility] for her refill.

## 2018-06-03 NOTE — Telephone Encounter (Signed)
Order placed to [facility] 06/04/18

## 2018-06-10 ENCOUNTER — Ambulatory Visit: Payer: Self-pay | Admitting: *Deleted

## 2018-06-10 NOTE — Telephone Encounter (Signed)
Appt scheduled 06/11/2018.

## 2018-06-10 NOTE — Telephone Encounter (Signed)
Left message on machine that medication is ready for pickup. 
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
## 2018-06-11 ENCOUNTER — Encounter: Payer: Self-pay | Admitting: Family Medicine

## 2018-06-11 ENCOUNTER — Ambulatory Visit: Payer: Medicare Other | Admitting: Family Medicine

## 2018-06-11 DIAGNOSIS — M542 Cervicalgia: Secondary | ICD-10-CM | POA: Diagnosis not present

## 2018-06-11 NOTE — Patient Instructions (Addendum)
Heat (pad or rice pillow in microwave) over affected area, 10-15 minutes twice daily.   OK to take Tylenol 1000 mg (2 extra strength tabs) or 975 mg (3 regular strength tabs) every 6 hours as needed.  Stretch the pectorals and the side muscles.  Let us know if you need anything.   Pectoralis Major Rehab Ask your health care provider which exercises are safe for you. Do exercises exactly as told by your health care provider and adjust them as directed. It is normal to feel mild stretching, pulling, tightness, or discomfort as you do these exercises, but you should stop right away if you feel sudden pain or your pain gets worse.Do not begin these exercises until told by your health care provider. Stretching and range of motion exercises These exercises warm up your muscles and joints and improve the movement and flexibility of your shoulder. These exercises can also help to relieve pain, numbness, and tingling. Exercise A: Pendulum  1. Stand near a wall or a surface that you can hold onto for balance. 2. Bend at the waist and let your left / right arm hang straight down. Use your other arm to keep your balance. 3. Relax your arm and shoulder muscles, and move your hips and your trunk so your left / right arm swings freely. Your arm should swing because of the motion of your body, not because you are using your arm or shoulder muscles. 4. Keep moving so your arm swings in the following directions, as told by your health care provider: ? Side to side. ? Forward and backward. ? In clockwise and counterclockwise circles. 5. Slowly return to the starting position. Repeat 2 times. Complete this exercise 3 times per week. Exercise B: Abduction, standing 1. Stand and hold a broomstick, a cane, or a similar object. Place your hands a little more than shoulder-width apart on the object. Your left / right hand should be palm-up, and your other hand should be palm-down. 2. While keeping your elbow  straight and your shoulder muscles relaxed, push the stick across your body toward your left / right side. Raise your left / right arm to the side of your body and then over your head until you feel a stretch in your shoulder. ? Stop when you reach the angle that is recommended by your health care provider. ? Avoid shrugging your shoulder while you raise your arm. Keep your shoulder blade tucked down toward the middle of your spine. 3. Hold for 10 seconds. 4. Slowly return to the starting position. Repeat 2 times. Complete this exercise 3 times per week. Exercise C: Wand flexion, supine  1. Lie on your back. You may bend your knees for comfort. 2. Hold a broomstick, a cane, or a similar object so that your hands are about shoulder-width apart on the object. Your palms should face toward your feet. 3. Raise your left / right arm in front of your face, then behind your head (toward the floor). Use your other hand to help you do this. Stop when you feel a gentle stretch in your shoulder, or when you reach the angle that is recommended by your health care provider. 4. Hold for 3 seconds. 5. Use the broomstick and your other arm to help you return your left / right arm to the starting position. Repeat 2 times. Complete this exercise 3 times per week. Exercise D: Wand shoulder external rotation 1. Stand and hold a broomstick, a cane, or a similar object so  your handsare about shoulder-width apart on the object. 2. Start with your arms hanging down, then bend both elbows to an "L" shape (90 degrees). 3. Keep your left / right elbow at your side. Use your other hand to push the stick so your left / right forearm moves away from your body, out to your side. ? Keep your left / right elbow bent to 90 degrees and keep it against your side. ? Stop when you feel a gentle stretch in your shoulder, or when you reach the angle recommended by your health care provider. 4. Hold for 10 seconds. 5. Use the stick to  help you return your left / right arm to the starting position. Repeat 2 times. Complete this exercise 3 times per week. Strengthening exercises These exercises build strength and endurance in your shoulder. Endurance is the ability to use your muscles for a long time, even after your muscles get tired. Exercise E: Scapular protraction, standing 1. Stand so you are facing a wall. Place your feet about one arm-length away from the wall. 2. Place your hands on the wall and straighten your elbows. 3. Keep your hands on the wall as you push your upper back away from the wall. You should feel your shoulder blades sliding forward.Keep your elbows and your head still. ? If you are not sure that you are doing this exercise correctly, ask your health care provider for more instructions. 4. Hold for 3 seconds. 5. Slowly return to the starting position. Let your muscles relax completely before you repeat this exercise. Repeat 2 times. Complete this exercise 3 times per week. Exercise F: Shoulder blade squeezes  (scapular retraction) 1. Sit with good posture in a stable chair. Do not let your back touch the back of the chair. 2. Your arms should be at your sides with your elbows bent. You may rest your forearms on a pillow if that is more comfortable. 3. Squeeze your shoulder blades together. Bring them down and back. ? Keep your shoulders level. ? Do not lift your shoulders up toward your ears. 4. Hold for 3 seconds. 5. Return to the starting position. Repeat 2 times. Complete this exercise 3 times per week. This information is not intended to replace advice given to you by your health care provider. Make sure you discuss any questions you have with your health care provider. Document Released: 04/23/2005 Document Revised: 02/02/2016 Document Reviewed: 01/09/2015 Elsevier Interactive Patient Education  [employer].
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
## 2018-06-11 NOTE — Progress Notes (Signed)
Pre visit review using our clinic review tool, if applicable. No additional management support is needed unless otherwise documented below in the visit note. 

## 2018-07-17 ENCOUNTER — Other Ambulatory Visit: Payer: Self-pay | Admitting: Family Medicine

## 2018-07-17 DIAGNOSIS — E785 Hyperlipidemia, unspecified: Secondary | ICD-10-CM

## 2018-07-17 DIAGNOSIS — I10 Essential (primary) hypertension: Secondary | ICD-10-CM

## 2018-07-30 ENCOUNTER — Telehealth: Payer: Self-pay

## 2018-07-30 NOTE — Telephone Encounter (Signed)
Copied from [place] [phone]. Topic: Appointment Scheduling - Scheduling Inquiry for Clinic >> Jul 30, 2018  3:57 PM Selinda Flavin B, [place] wrote: Reason for CRM: Patient calling and states that she does not want to come into the office and chance catching something for her appointment on 08/05/2018.  Please advise. Would like a call on the other appointment options.

## 2018-08-04 NOTE — Telephone Encounter (Signed)
Spoke with patient.  Will do webex visit.

## 2018-08-05 ENCOUNTER — Ambulatory Visit (INDEPENDENT_AMBULATORY_CARE_PROVIDER_SITE_OTHER): Payer: Medicare Other | Admitting: Family Medicine

## 2018-08-05 ENCOUNTER — Encounter: Payer: Self-pay | Admitting: Family Medicine

## 2018-08-05 ENCOUNTER — Other Ambulatory Visit: Payer: Self-pay

## 2018-08-05 DIAGNOSIS — G4700 Insomnia, unspecified: Secondary | ICD-10-CM

## 2018-08-05 DIAGNOSIS — G894 Chronic pain syndrome: Secondary | ICD-10-CM

## 2018-08-05 MED ORDER — OXYCODONE-ACETAMINOPHEN 5-325 MG PO TABS: 1.0000 | ORAL_TABLET | Freq: Three times a day (TID) | ORAL | 0 refills | Status: DC | PRN

## 2018-08-05 MED ORDER — ZOLPIDEM TARTRATE 5 MG PO TABS: 5.0000 mg | ORAL_TABLET | Freq: Every evening | ORAL | 3 refills | Status: DC | PRN

## 2018-08-05 NOTE — Progress Notes (Signed)
Virtual Visit via Video Note  I connected with Amanda Joyce on 08/05/18 at  3:00 PM EDT by a video enabled telemedicine application and verified that I am speaking with the correct person using two identifiers.   I discussed the limitations of evaluation and management by telemedicine and the availability of in person appointments. The patient expressed understanding and agreed to proceed.  History of Present Illness: Pt is home with her son and is need of refills on her pain meds and ambien.  She has no complaints.   She is doing well.     Observations/Objective: Afebrile  rr normal  Pt is in NAD Not sob  Assessment and Plan: 1. Chronic pain syndrome Stable-- con't pain meds Database reviewed Contract / uds utd  - oxyCODONE-acetaminophen (ROXICET) 5-325 MG tablet; Take 1 tablet by mouth every 8 (eight) hours as needed for severe pain.  Dispense: 90 tablet; Refill: 0 - oxyCODONE-acetaminophen (ROXICET) 5-325 MG tablet; Take 1 tablet by mouth every 8 (eight) hours as needed for severe pain.  Dispense: 90 tablet; Refill: 0 - oxyCODONE-acetaminophen (PERCOCET/ROXICET) 5-325 MG tablet; Take 1 tablet by mouth every 8 (eight) hours as needed for severe pain.  Dispense: 90 tablet; Refill: 0  2. Insomnia, unspecified type stable - zolpidem (AMBIEN) 5 MG tablet; Take 1 tablet (5 mg total) by mouth at bedtime as needed. for sleep  Dispense: 30 tablet; Refill: 3   Follow Up Instructions:    I discussed the assessment and treatment plan with the patient. The patient was provided an opportunity to ask questions and all were answered. The patient agreed with the plan and demonstrated an understanding of the instructions.   The patient was advised to call back or seek an in-person evaluation if the symptoms worsen or if the condition fails to improve as anticipated.  I provided 25 minutes of non-face-to-face time during this encounter.-- >50% spent discussing opioids and bzd    Ann Held, DO

## 2018-09-02 ENCOUNTER — Other Ambulatory Visit: Payer: Self-pay | Admitting: Family Medicine

## 2018-09-02 DIAGNOSIS — M158 Other polyosteoarthritis: Secondary | ICD-10-CM

## 2018-09-06 ENCOUNTER — Telehealth: Payer: Self-pay | Admitting: Family Medicine

## 2018-09-06 DIAGNOSIS — M158 Other polyosteoarthritis: Secondary | ICD-10-CM

## 2018-09-08 ENCOUNTER — Ambulatory Visit (INDEPENDENT_AMBULATORY_CARE_PROVIDER_SITE_OTHER): Payer: Medicare Other | Admitting: Family Medicine

## 2018-09-08 ENCOUNTER — Other Ambulatory Visit: Payer: Self-pay

## 2018-09-08 ENCOUNTER — Encounter: Payer: Self-pay | Admitting: Family Medicine

## 2018-09-08 DIAGNOSIS — G8929 Other chronic pain: Secondary | ICD-10-CM

## 2018-09-08 DIAGNOSIS — M25512 Pain in left shoulder: Secondary | ICD-10-CM | POA: Diagnosis not present

## 2018-09-08 MED ORDER — IBUPROFEN 800 MG PO TABS: 800.0000 mg | ORAL_TABLET | Freq: Three times a day (TID) | ORAL | 0 refills | Status: DC | PRN

## 2018-09-08 MED ORDER — TIZANIDINE HCL 4 MG PO TABS: 4.0000 mg | ORAL_TABLET | Freq: Four times a day (QID) | ORAL | 0 refills | Status: DC | PRN

## 2018-09-08 NOTE — Addendum Note (Signed)
Addended by: Kem Boroughs D on: 09/08/2018 03:05 PM   Modules accepted: Orders
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
## 2018-09-08 NOTE — Telephone Encounter (Signed)
Please resend to Kristopher Oppenheim at [place], [place] [address] Phone: [phone] Fax: [phone]. Not using [facility].

## 2018-09-08 NOTE — Telephone Encounter (Signed)
Pharmacy changed

## 2018-09-18 ENCOUNTER — Telehealth: Payer: Self-pay | Admitting: Family Medicine

## 2018-09-18 NOTE — Telephone Encounter (Signed)
Copied from [place] [id number]. Topic: Quick Communication - Rx Refill/Question >> Sep 18, 2018  2:07 PM Scherrie Gerlach wrote: Medication:    desvenlafaxine (PRISTIQ) 100 MG 24 hr tablet  Pt states this is to be sent right to the company, not local pharmacy. Pt states it is time for a 90 day refill

## 2018-09-19 ENCOUNTER — Other Ambulatory Visit: Payer: Self-pay

## 2018-09-19 DIAGNOSIS — F411 Generalized anxiety disorder: Secondary | ICD-10-CM

## 2018-09-19 NOTE — Telephone Encounter (Signed)
Patient application has expired.  [employer] sending over new application.  Will call patient on Monday.

## 2018-09-22 NOTE — Telephone Encounter (Signed)
Patient notified that she will need to renew application.  Patient wanted application mailed.  Application mailed out today.

## 2018-09-26 MED ORDER — DESVENLAFAXINE SUCCINATE ER 100 MG PO TB24: 100.0000 mg | ORAL_TABLET | Freq: Every day | ORAL | 0 refills | Status: AC

## 2018-09-26 MED ORDER — DESVENLAFAXINE SUCCINATE ER 100 MG PO TB24: 100.0000 mg | ORAL_TABLET | Freq: Every day | ORAL | 0 refills | Status: DC

## 2018-09-26 NOTE — Telephone Encounter (Signed)
Resending medication to different pharmacy after receiving could not send order from [place].

## 2018-09-26 NOTE — Addendum Note (Signed)
Addended by: Sanda Linger on: 09/26/2018 12:13 PM   Modules accepted: Orders

## 2018-10-17 NOTE — Telephone Encounter (Signed)
Pt wanted to let office know that she sent her 1099 to [facility]

## 2018-10-20 ENCOUNTER — Telehealth: Payer: Self-pay | Admitting: *Deleted

## 2018-10-20 NOTE — Telephone Encounter (Signed)
[employer] application faxed and sent to scan

## 2018-10-22 NOTE — Telephone Encounter (Signed)
Approved for Pristiq until 10/20/2019. Approval letter sent to scanning.

## 2018-11-03 ENCOUNTER — Ambulatory Visit (INDEPENDENT_AMBULATORY_CARE_PROVIDER_SITE_OTHER): Payer: Medicare Other | Admitting: Family Medicine

## 2018-11-03 ENCOUNTER — Other Ambulatory Visit: Payer: Self-pay

## 2018-11-03 ENCOUNTER — Encounter: Payer: Self-pay | Admitting: Family Medicine

## 2018-11-03 VITALS — Ht 68.0 in | Wt 160.0 lb

## 2018-11-03 DIAGNOSIS — E785 Hyperlipidemia, unspecified: Secondary | ICD-10-CM

## 2018-11-03 DIAGNOSIS — G4700 Insomnia, unspecified: Secondary | ICD-10-CM | POA: Diagnosis not present

## 2018-11-03 DIAGNOSIS — G894 Chronic pain syndrome: Secondary | ICD-10-CM

## 2018-11-03 MED ORDER — OXYCODONE-ACETAMINOPHEN 5-325 MG PO TABS: 1.0000 | ORAL_TABLET | Freq: Three times a day (TID) | ORAL | 0 refills | Status: DC | PRN

## 2018-11-03 MED ORDER — ZOLPIDEM TARTRATE 5 MG PO TABS: 5.0000 mg | ORAL_TABLET | Freq: Every evening | ORAL | 3 refills | Status: DC | PRN

## 2018-11-03 NOTE — Progress Notes (Signed)
Virtual Visit via Video Note  I connected with Marney Doctor on 11/03/18 at  1:30 PM EDT by a video enabled telemedicine application and verified that I am speaking with the correct person using two identifiers.  Location: Patient: home  Provider: office son   I discussed the limitations of evaluation and management by telemedicine and the availability of in person appointments. The patient expressed understanding and agreed to proceed.  History of Present Illness: Pt here to f/u chronic pain meds Doing well ---  No increase/ change in symptoms  Pt also needs labs    Observations/Objective: No vitals obtained  Pt in NAD  Assessment and Plan: 1. Chronic pain syndrome Stable Refill meds Pt to come in for labs and uds - oxyCODONE-acetaminophen (ROXICET) 5-325 MG tablet; Take 1 tablet by mouth every 8 (eight) hours as needed for severe pain.  Dispense: 90 tablet; Refill: 0 - Pain Mgmt, Profile 8 w/Conf, U; Future  2. Insomnia, unspecified type Stable uds pending  - zolpidem (AMBIEN) 5 MG tablet; Take 1 tablet (5 mg total) by mouth at bedtime as needed. for sleep  Dispense: 30 tablet; Refill: 3  3. Hyperlipidemia, unspecified hyperlipidemia type Encouraged heart healthy diet, increase exercise, avoid trans fats, consider a krill oil cap daily - Lipid panel; Future - Comprehensive metabolic panel; Future   Follow Up Instructions:    I discussed the assessment and treatment plan with the patient. The patient was provided an opportunity to ask questions and all were answered. The patient agreed with the plan and demonstrated an understanding of the instructions.   The patient was advised to call back or seek an in-person evaluation if the symptoms worsen or if the condition fails to improve as anticipated.  I provided 15 minutes of non-face-to-face time during this encounter.   Ann Held, DO

## 2018-11-04 ENCOUNTER — Ambulatory Visit: Payer: Medicare Other | Admitting: Family Medicine

## 2018-11-06 ENCOUNTER — Telehealth: Payer: Self-pay | Admitting: Family Medicine

## 2018-11-06 NOTE — Telephone Encounter (Signed)
LVM for pt to call and schedule her lab appt and 3 month fu appt with [place].

## 2018-11-07 NOTE — Telephone Encounter (Signed)
Patient returning call regarding scheduling a lab appointment. Patient states please call back on Monday after 10am

## 2018-11-11 ENCOUNTER — Other Ambulatory Visit: Payer: Self-pay

## 2018-11-13 ENCOUNTER — Other Ambulatory Visit (INDEPENDENT_AMBULATORY_CARE_PROVIDER_SITE_OTHER): Payer: Medicare Other

## 2018-11-13 ENCOUNTER — Other Ambulatory Visit: Payer: Self-pay

## 2018-11-13 DIAGNOSIS — G894 Chronic pain syndrome: Secondary | ICD-10-CM

## 2018-11-13 DIAGNOSIS — E785 Hyperlipidemia, unspecified: Secondary | ICD-10-CM

## 2018-11-13 LAB — LIPID PANEL
Cholesterol: 169 mg/dL (ref 0–200)
HDL: 44.6 mg/dL (ref >39.00)
NonHDL: 124.68
Total CHOL/HDL Ratio: 4
Triglycerides: 222 mg/dL — ABNORMAL HIGH (ref 0.0–149.0)
VLDL: 44.4 mg/dL — ABNORMAL HIGH (ref 0.0–40.0)

## 2018-11-13 LAB — LDL CHOLESTEROL, DIRECT: Direct LDL: 108 mg/dL

## 2018-11-13 LAB — COMPREHENSIVE METABOLIC PANEL
ALT: 13 U/L (ref 0–35)
AST: 15 U/L (ref 0–37)
Albumin: 4.5 g/dL (ref 3.5–5.2)
Alkaline Phosphatase: 59 U/L (ref 39–117)
BUN: 17 mg/dL (ref 6–23)
CO2: 29 mEq/L (ref 19–32)
Calcium: 10.3 mg/dL (ref 8.4–10.5)
Chloride: 101 mEq/L (ref 96–112)
Creatinine, Ser: 0.89 mg/dL (ref 0.40–1.20)
GFR: 61.29 mL/min (ref >60.00)
Glucose, Bld: 139 mg/dL — ABNORMAL HIGH (ref 70–99)
Potassium: 4 mEq/L (ref 3.5–5.1)
Sodium: 139 mEq/L (ref 135–145)
Total Bilirubin: 0.7 mg/dL (ref 0.2–1.2)
Total Protein: 6.8 g/dL (ref 6.0–8.3)

## 2018-11-15 LAB — PAIN MGMT, PROFILE 8 W/CONF, U
6 Acetylmorphine: NEGATIVE ng/mL
Alcohol Metabolites: NEGATIVE ng/mL (ref <500)
Amphetamines: NEGATIVE ng/mL
Benzodiazepines: NEGATIVE ng/mL
Buprenorphine, Urine: NEGATIVE ng/mL
Cocaine Metabolite: NEGATIVE ng/mL
Codeine: NEGATIVE ng/mL
Creatinine: 115.9 mg/dL
Hydrocodone: NEGATIVE ng/mL
Hydromorphone: NEGATIVE ng/mL
MDMA: NEGATIVE ng/mL
Marijuana Metabolite: NEGATIVE ng/mL
Morphine: NEGATIVE ng/mL
Norhydrocodone: NEGATIVE ng/mL
Noroxycodone: 1379 ng/mL
Opiates: NEGATIVE ng/mL
Oxidant: NEGATIVE ug/mL
Oxycodone: 1359 ng/mL
Oxycodone: POSITIVE ng/mL
Oxymorphone: 3216 ng/mL
pH: 5.2 (ref 4.5–9.0)

## 2018-11-18 ENCOUNTER — Other Ambulatory Visit: Payer: Self-pay | Admitting: Family Medicine

## 2018-11-18 DIAGNOSIS — R739 Hyperglycemia, unspecified: Secondary | ICD-10-CM

## 2018-12-03 ENCOUNTER — Telehealth: Payer: Self-pay | Admitting: Family Medicine

## 2018-12-03 NOTE — Telephone Encounter (Signed)
Pt called wanting results from recent labs. It only says low risk. Please advise.

## 2018-12-04 ENCOUNTER — Other Ambulatory Visit: Payer: Self-pay | Admitting: Family Medicine

## 2018-12-04 DIAGNOSIS — R739 Hyperglycemia, unspecified: Secondary | ICD-10-CM

## 2018-12-04 NOTE — Telephone Encounter (Signed)
That was for her uds Her glucose was elevated-- we need to repeat bmp , hgba1c fasting ----- I could not add a a1c

## 2018-12-05 NOTE — Telephone Encounter (Signed)
Lab appointment set for Tuesday 12/09/2018

## 2018-12-08 ENCOUNTER — Other Ambulatory Visit: Payer: Self-pay | Admitting: Family Medicine

## 2018-12-08 ENCOUNTER — Telehealth: Payer: Self-pay | Admitting: Family Medicine

## 2018-12-08 ENCOUNTER — Encounter: Payer: Self-pay | Admitting: *Deleted

## 2018-12-08 DIAGNOSIS — G894 Chronic pain syndrome: Secondary | ICD-10-CM

## 2018-12-08 MED ORDER — OXYCODONE-ACETAMINOPHEN 5-325 MG PO TABS: 1.0000 | ORAL_TABLET | Freq: Three times a day (TID) | ORAL | 0 refills | Status: DC | PRN

## 2018-12-08 NOTE — Telephone Encounter (Signed)
[facility].  I will call and cancel at [place].

## 2018-12-08 NOTE — Telephone Encounter (Signed)
Patient would like 3 rxs sent in.  It has been messed up the past few times.  Can you send her in 3 rxs.  Also that other rx that was sent had an old date on it.  Also she comes in for labs tomorrow.  I will have her sign contract at that time.

## 2018-12-08 NOTE — Telephone Encounter (Signed)
[facility]

## 2018-12-08 NOTE — Telephone Encounter (Signed)
[facility] or [facility]?

## 2018-12-08 NOTE — Telephone Encounter (Signed)
Need contract Refill sent  

## 2018-12-08 NOTE — Telephone Encounter (Signed)
Last written:  11/03/18 Last ov: 11/03/18 Next ov: 02/03/19 Contract: due UDS: 11/17/18

## 2018-12-08 NOTE — Telephone Encounter (Signed)
Pt son called and stated that they would like RX sent to  Kristopher Oppenheim at [place], [place] [phone] (Phone) [phone] (Fax)   Pt son states that they get a better price with [facility]

## 2018-12-08 NOTE — Telephone Encounter (Signed)
Medication Refill - Medication: oxyCODONE-acetaminophen (ROXICET) 5-325 MG tablet    Has the patient contacted their pharmacy? Yes.   (Agent: If yes, when and what did the pharmacy advise?) Pt already contacted pharmacy and picked up one month's prescription and is wanting to get 2 prescriptions for the next 2 months sent over to pharmacy.   Pt would like for all future prescriptions for this medication to be written for 3 months instead of 1 month. Pt has 1 pill remaining.   Preferred Pharmacy (with phone number or street name):  [facility] [address] [place] [phone] (Phone) [phone] (Fax)     Agent: Please be advised that RX refills may take up to 3 business days. We ask that you follow-up with your pharmacy.

## 2018-12-09 ENCOUNTER — Other Ambulatory Visit (INDEPENDENT_AMBULATORY_CARE_PROVIDER_SITE_OTHER): Payer: Medicare Other

## 2018-12-09 ENCOUNTER — Other Ambulatory Visit: Payer: Self-pay

## 2018-12-09 DIAGNOSIS — R739 Hyperglycemia, unspecified: Secondary | ICD-10-CM

## 2018-12-09 LAB — HEMOGLOBIN A1C: Hgb A1c MFr Bld: 5.9 % (ref 4.6–6.5)

## 2018-12-09 LAB — BASIC METABOLIC PANEL
BUN: 18 mg/dL (ref 6–23)
CO2: 29 mEq/L (ref 19–32)
Calcium: 10.3 mg/dL (ref 8.4–10.5)
Chloride: 101 mEq/L (ref 96–112)
Creatinine, Ser: 0.77 mg/dL (ref 0.40–1.20)
GFR: 72.43 mL/min (ref >60.00)
Glucose, Bld: 123 mg/dL — ABNORMAL HIGH (ref 70–99)
Potassium: 4 mEq/L (ref 3.5–5.1)
Sodium: 138 mEq/L (ref 135–145)

## 2018-12-15 ENCOUNTER — Telehealth: Payer: Self-pay | Admitting: Family Medicine

## 2018-12-15 NOTE — Telephone Encounter (Signed)
Copied from [place] [phone]. Topic: General - Inquiry >> Dec 15, 2018  4:07 PM Percell Belt A wrote: Reason for CRM:  pt called in and stated that she just rec'd a order of this med and was charged for it.  She stated she recs pt assistance for this med?  She would like to know what happened?   desvenlafaxine (PRISTIQ) 100 MG 24 hr tablet [[id number]]

## 2018-12-17 ENCOUNTER — Other Ambulatory Visit: Payer: Self-pay | Admitting: *Deleted

## 2018-12-17 NOTE — Telephone Encounter (Signed)
Patient stated that she got Pristiq from a mail order [facility].  Advised her that looking back in her chart that we sent out an rx on 09/26/18 in error and no one cancelled it.  I called mail order pharmacy at [phone] and spoke with Arbie Cookey she stated that they will credit back the 74.00 and send patient a return label.  She gave a reference number [id number].  Called patient back to let her know that they will credit back the 74.00 and contact her to send a return label to send medication back.

## 2018-12-17 NOTE — Telephone Encounter (Signed)
error 

## 2019-01-09 ENCOUNTER — Other Ambulatory Visit: Payer: Self-pay | Admitting: Family Medicine

## 2019-01-09 DIAGNOSIS — E785 Hyperlipidemia, unspecified: Secondary | ICD-10-CM

## 2019-01-09 DIAGNOSIS — I10 Essential (primary) hypertension: Secondary | ICD-10-CM

## 2019-01-09 MED ORDER — PRAVASTATIN SODIUM 20 MG PO TABS: ORAL_TABLET | ORAL | 0 refills | Status: DC

## 2019-01-09 MED ORDER — DILTIAZEM HCL ER COATED BEADS 240 MG PO CP24: ORAL_CAPSULE | ORAL | 0 refills | Status: DC

## 2019-01-09 MED ORDER — METOPROLOL TARTRATE 25 MG PO TABS: ORAL_TABLET | ORAL | 0 refills | Status: DC

## 2019-01-09 MED ORDER — QUINAPRIL HCL 40 MG PO TABS: 40.0000 mg | ORAL_TABLET | Freq: Every day | ORAL | 0 refills | Status: DC

## 2019-01-09 NOTE — Telephone Encounter (Signed)
Medication Refill - Medication: metoprolol tartrate (LOPRESSOR) 25 MG tablet and quinapril (ACCUPRIL) 40 MG tablet and pravastatin (PRAVACHOL) 20 MG tablet and diltiazem (CARTIA XT) 240 MG 24 hr capsule    Preferred Pharmacy (with phone number or street name): [facility], Patient stated that the doctor orders these medication through [facility]
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
## 2019-01-22 ENCOUNTER — Other Ambulatory Visit: Payer: Self-pay | Admitting: *Deleted

## 2019-01-22 DIAGNOSIS — E785 Hyperlipidemia, unspecified: Secondary | ICD-10-CM

## 2019-01-22 DIAGNOSIS — I10 Essential (primary) hypertension: Secondary | ICD-10-CM

## 2019-01-22 MED ORDER — PRAVASTATIN SODIUM 20 MG PO TABS: ORAL_TABLET | ORAL | 3 refills | Status: AC

## 2019-01-22 MED ORDER — DILTIAZEM HCL ER COATED BEADS 240 MG PO CP24: ORAL_CAPSULE | ORAL | 3 refills | Status: AC

## 2019-01-22 MED ORDER — QUINAPRIL HCL 40 MG PO TABS: 40.0000 mg | ORAL_TABLET | Freq: Every day | ORAL | 3 refills | Status: AC

## 2019-01-22 MED ORDER — METOPROLOL TARTRATE 25 MG PO TABS: ORAL_TABLET | ORAL | 3 refills | Status: AC

## 2019-01-29 ENCOUNTER — Other Ambulatory Visit: Payer: Self-pay | Admitting: Family Medicine

## 2019-01-29 DIAGNOSIS — M158 Other polyosteoarthritis: Secondary | ICD-10-CM

## 2019-02-03 ENCOUNTER — Ambulatory Visit: Payer: Medicare Other | Admitting: Family Medicine

## 2019-02-18 ENCOUNTER — Other Ambulatory Visit: Payer: Self-pay | Admitting: Family Medicine

## 2019-02-18 DIAGNOSIS — Z1231 Encounter for screening mammogram for malignant neoplasm of breast: Secondary | ICD-10-CM

## 2019-02-20 ENCOUNTER — Telehealth: Payer: Self-pay

## 2019-02-20 NOTE — Telephone Encounter (Signed)
Spoke with patient. Offered patient a virtual with [facility] today. Pt refused. Pt wanted to drop off sample and receive antibiotics. Advised patient that since Dr. Etter Sjogren was not in the office Dr. Nani Ravens wanted a virtual. Pt states wanting to wait for Dr. Etter Sjogren. Pt asked for a Tuesday afternoon appointment but wants me to talk to Dr. Etter Sjogren on Monday if a appointment is really needed. I advised the patient that if her symptoms get worse she needs to go to a urgent care for treatment. Pt states she will wait for Dr. Etter Sjogren.

## 2019-02-20 NOTE — Telephone Encounter (Signed)
Copied from [place] [id number]. Topic: General - Other >> Feb 20, 2019  2:25 PM Keene Breath wrote: Reason for CRM: Patient called to ask the nurse to call her regarding going to the lab for a urine test.  She thinks she might have a UTI, but would like to speak with the nurse first.  CB# [phone]

## 2019-02-24 ENCOUNTER — Other Ambulatory Visit: Payer: Self-pay

## 2019-02-24 ENCOUNTER — Other Ambulatory Visit: Payer: Self-pay | Admitting: Family Medicine

## 2019-02-24 ENCOUNTER — Encounter: Payer: Self-pay | Admitting: Family Medicine

## 2019-02-24 ENCOUNTER — Ambulatory Visit (INDEPENDENT_AMBULATORY_CARE_PROVIDER_SITE_OTHER): Payer: Medicare Other | Admitting: Family Medicine

## 2019-02-24 VITALS — BP 120/60 | HR 73 | Temp 97.7°F | Resp 18 | Ht 68.0 in | Wt 159.4 lb

## 2019-02-24 DIAGNOSIS — N390 Urinary tract infection, site not specified: Secondary | ICD-10-CM | POA: Diagnosis not present

## 2019-02-24 DIAGNOSIS — F5101 Primary insomnia: Secondary | ICD-10-CM | POA: Diagnosis not present

## 2019-02-24 DIAGNOSIS — M8949 Other hypertrophic osteoarthropathy, multiple sites: Secondary | ICD-10-CM | POA: Diagnosis not present

## 2019-02-24 DIAGNOSIS — Z23 Encounter for immunization: Secondary | ICD-10-CM

## 2019-02-24 DIAGNOSIS — M150 Primary generalized (osteo)arthritis: Secondary | ICD-10-CM

## 2019-02-24 DIAGNOSIS — M159 Polyosteoarthritis, unspecified: Secondary | ICD-10-CM

## 2019-02-24 DIAGNOSIS — R739 Hyperglycemia, unspecified: Secondary | ICD-10-CM

## 2019-02-24 LAB — POCT URINALYSIS DIPSTICK OB
Bilirubin, UA: NEGATIVE
Blood, UA: POSITIVE
Glucose, UA: NEGATIVE
Ketones, UA: NEGATIVE
Nitrite, UA: NEGATIVE
Spec Grav, UA: 1.015 (ref 1.010–1.025)
Urobilinogen, UA: 0.2 E.U./dL
pH, UA: 6 (ref 5.0–8.0)

## 2019-02-24 MED ORDER — NITROFURANTOIN MONOHYD MACRO 100 MG PO CAPS: 100.0000 mg | ORAL_CAPSULE | Freq: Two times a day (BID) | ORAL | 0 refills | Status: AC
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
## 2019-02-24 NOTE — Patient Instructions (Signed)
Urinary Tract Infection, Adult A urinary tract infection (UTI) is an infection of any part of the urinary tract. The urinary tract includes:  The kidneys.  The ureters.  The bladder.  The urethra. These organs make, store, and get rid of pee (urine) in the body. What are the causes? This is caused by germs (bacteria) in your genital area. These germs grow and cause swelling (inflammation) of your urinary tract. What increases the risk? You are more likely to develop this condition if:  You have a small, thin tube (catheter) to drain pee.  You cannot control when you pee or poop (incontinence).  You are female, and: ? You use these methods to prevent pregnancy: ? A medicine that kills sperm (spermicide). ? A device that blocks sperm (diaphragm). ? You have low levels of a female hormone (estrogen). ? You are pregnant.  You have genes that add to your risk.  You are sexually active.  You take antibiotic medicines.  You have trouble peeing because of: ? A prostate that is bigger than normal, if you are female. ? A blockage in the part of your body that drains pee from the bladder (urethra). ? A kidney stone. ? A nerve condition that affects your bladder (neurogenic bladder). ? Not getting enough to drink. ? Not peeing often enough.  You have other conditions, such as: ? Diabetes. ? A weak disease-fighting system (immune system). ? Sickle cell disease. ? Gout. ? Injury of the spine. What are the signs or symptoms? Symptoms of this condition include:  Needing to pee right away (urgently).  Peeing often.  Peeing small amounts often.  Pain or burning when peeing.  Blood in the pee.  Pee that smells bad or not like normal.  Trouble peeing.  Pee that is cloudy.  Fluid coming from the vagina, if you are female.  Pain in the belly or lower back. Other symptoms include:  Throwing up (vomiting).  No urge to eat.  Feeling mixed up (confused).  Being tired  and grouchy (irritable).  A fever.  Watery poop (diarrhea). How is this treated? This condition may be treated with:  Antibiotic medicine.  Other medicines.  Drinking enough water. Follow these instructions at home:  Medicines  Take over-the-counter and prescription medicines only as told by your doctor.  If you were prescribed an antibiotic medicine, take it as told by your doctor. Do not stop taking it even if you start to feel better. General instructions  Make sure you: ? Pee until your bladder is empty. ? Do not hold pee for a long time. ? Empty your bladder after sex. ? Wipe from front to back after pooping if you are a female. Use each tissue one time when you wipe.  Drink enough fluid to keep your pee pale yellow.  Keep all follow-up visits as told by your doctor. This is important. Contact a doctor if:  You do not get better after 1-2 days.  Your symptoms go away and then come back. Get help right away if:  You have very bad back pain.  You have very bad pain in your lower belly.  You have a fever.  You are sick to your stomach (nauseous).  You are throwing up. Summary  A urinary tract infection (UTI) is an infection of any part of the urinary tract.  This condition is caused by germs in your genital area.  There are many risk factors for a UTI. These include having a small, thin   tube to drain pee and not being able to control when you pee or poop.  Treatment includes antibiotic medicines for germs.  Drink enough fluid to keep your pee pale yellow. This information is not intended to replace advice given to you by your health care provider. Make sure you discuss any questions you have with your health care provider. Document Released: 10/10/2007 Document Revised: 04/10/2018 Document Reviewed: 10/31/2017 Elsevier Patient Education  2020 Elsevier Inc.  

## 2019-02-24 NOTE — Assessment & Plan Note (Signed)
Stable. Refill meds
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
## 2019-02-24 NOTE — Assessment & Plan Note (Signed)
Stable Database reviewed uds and contract uds rto 3 months

## 2019-02-26 LAB — URINE CULTURE
MICRO NUMBER:: 1009131
SPECIMEN QUALITY:: ADEQUATE

## 2019-03-05 ENCOUNTER — Ambulatory Visit: Payer: Medicare Other | Admitting: Family Medicine

## 2019-03-09 ENCOUNTER — Telehealth: Payer: Self-pay | Admitting: Family Medicine

## 2019-03-09 NOTE — Telephone Encounter (Signed)
,  Medication Refill - Medication: zolpidem (AMBIEN) 5 MG tablet /oxyCODONE-acetaminophen (PERCOCET/ROXICET) 5-325 MG tablet /oxyCODONE-acetaminophenoxyCODONE-acetaminophen (ROXICET) 5-325 MG tablet  (ROXICET) 5-325 MG tablet   Oxycodone goes to  [facility] at [place], [place] [phone] (Phone) [phone] (Fax)   Ambien goes to [place], [place].  Has the patient contacted their pharmacy? Yes.   (Agent: If no, request that the patient contact the pharmacy for the refill.) (Agent: If yes, when and what did the pharmacy advise?)  Preferred Pharmacy (with phone number or street name):   Agent: Please be advised that RX refills may take up to 3 business days. We ask that you follow-up with your pharmacy.      Copied from [place] [phone]. Topic: Quick Communication - Rx Refill/Question >> Mar 06, 2019 11:35 AM Carolyn Stare wrote: Medication  oxyCODONE-acetaminophen (ROXICET) 5-325 MG tablet     [place]     zolpidem (AMBIEN) 5 MG tablet [place]    Preferred Pharmacy  Agent: Please be advised that RX refills may take up to 3 business days. We ask that you follow-up with your pharmacy.

## 2019-03-09 NOTE — Telephone Encounter (Signed)
Please send oxycodone to [facility] and Ambien to [facility].  Last written: oxycodone 12/08/18   ambien 11/03/18 Last ov: 02/24/19 Next ov: nothing scheduled Contract: 12/29/18 UDS: 11/17/18

## 2019-03-10 ENCOUNTER — Other Ambulatory Visit: Payer: Self-pay | Admitting: Family Medicine

## 2019-03-10 ENCOUNTER — Telehealth: Payer: Self-pay

## 2019-03-10 DIAGNOSIS — G4700 Insomnia, unspecified: Secondary | ICD-10-CM

## 2019-03-10 DIAGNOSIS — G894 Chronic pain syndrome: Secondary | ICD-10-CM

## 2019-03-10 MED ORDER — ZOLPIDEM TARTRATE 5 MG PO TABS: 5.0000 mg | ORAL_TABLET | Freq: Every evening | ORAL | 3 refills | Status: DC | PRN

## 2019-03-10 MED ORDER — OXYCODONE-ACETAMINOPHEN 5-325 MG PO TABS: 1.0000 | ORAL_TABLET | Freq: Three times a day (TID) | ORAL | 0 refills | Status: DC | PRN

## 2019-03-10 MED ORDER — OXYCODONE-ACETAMINOPHEN 5-325 MG PO TABS: 1.0000 | ORAL_TABLET | Freq: Three times a day (TID) | ORAL | 0 refills | Status: AC | PRN

## 2019-03-10 NOTE — Telephone Encounter (Signed)
Meds refilled.

## 2019-03-10 NOTE — Telephone Encounter (Signed)
    Copied from [place] [id number]. Topic: Quick Communication - Rx Refill/Question >> Mar 06, 2019 11:35 AM Carolyn Stare wrote: Medication  oxyCODONE-acetaminophen (ROXICET) 5-325 MG tablet     [place]     zolpidem (AMBIEN) 5 MG tablet [place]    Preferred Pharmacy  Agent: Please be advised that RX refills may take up to 3 business days. We ask that you follow-up with your pharmacy.

## 2019-03-27 ENCOUNTER — Other Ambulatory Visit: Payer: Medicare Other

## 2019-04-07 ENCOUNTER — Telehealth: Payer: Self-pay | Admitting: *Deleted

## 2019-04-07 NOTE — Telephone Encounter (Signed)
Patient notified that I did call and speak with someone at mail order pharmacy and they stated that they are not sure why medication was filled early.  They are returning money for the medications that she was charged for.  I explained to patient that what maybe happened is in September rxs for medications was sent to mail order on 9/4 and 9/17 and this is could have what happened to why medication was filled too soon.  She should not have any more issues like this again hopefully.

## 2019-04-07 NOTE — Telephone Encounter (Signed)
Patient stated that she received medication early from mail order for diltiazem, pravastatin, metoprolol, and quinapril.  She called the mail order to ask them why did they send medications early and they told her because we authorized them to send it.  I advised that the last time that we sent anything was on 01/22/19.  I asked was she on an automatic refill or something and she said no.  Her next refill should be on or around 04/23/19.  I advised patient that I will see what I can find out for her.  She gave me a number to call [phone].

## 2019-04-07 NOTE — Telephone Encounter (Signed)
Copied from [place] [id number]. Topic: General - Call Back - No Documentation >> Apr 07, 2019  2:53 PM Erick Blinks wrote: Pt called requesting a call back from management. Please advise (Medication delivery issues)  Best contact: [phone]

## 2019-04-08 ENCOUNTER — Other Ambulatory Visit: Payer: Self-pay

## 2019-04-08 ENCOUNTER — Ambulatory Visit
Admission: RE | Admit: 2019-04-08 | Discharge: 2019-04-08 | Disposition: A | Discharge status: Home / Self Care | Payer: Medicare Other | Source: Ambulatory Visit | Attending: Family Medicine | Admitting: Family Medicine

## 2019-04-08 DIAGNOSIS — Z1231 Encounter for screening mammogram for malignant neoplasm of breast: Secondary | ICD-10-CM | POA: Diagnosis not present
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
## 2019-05-05 ENCOUNTER — Other Ambulatory Visit: Payer: Self-pay

## 2019-05-05 ENCOUNTER — Encounter: Payer: Self-pay | Admitting: Family Medicine

## 2019-05-05 ENCOUNTER — Ambulatory Visit: Payer: Self-pay | Admitting: *Deleted

## 2019-05-05 ENCOUNTER — Ambulatory Visit (INDEPENDENT_AMBULATORY_CARE_PROVIDER_SITE_OTHER): Payer: Medicare Other | Admitting: Family Medicine

## 2019-05-05 VITALS — BP 160/90 | HR 85 | Temp 97.6°F | Resp 18 | Ht 68.0 in | Wt 157.8 lb

## 2019-05-05 DIAGNOSIS — F419 Anxiety disorder, unspecified: Secondary | ICD-10-CM | POA: Diagnosis not present

## 2019-05-05 DIAGNOSIS — N390 Urinary tract infection, site not specified: Secondary | ICD-10-CM

## 2019-05-05 DIAGNOSIS — R8290 Unspecified abnormal findings in urine: Secondary | ICD-10-CM | POA: Diagnosis not present

## 2019-05-05 DIAGNOSIS — M62838 Other muscle spasm: Secondary | ICD-10-CM | POA: Diagnosis not present

## 2019-05-05 DIAGNOSIS — E785 Hyperlipidemia, unspecified: Secondary | ICD-10-CM

## 2019-05-05 DIAGNOSIS — M25512 Pain in left shoulder: Secondary | ICD-10-CM

## 2019-05-05 DIAGNOSIS — G894 Chronic pain syndrome: Secondary | ICD-10-CM

## 2019-05-05 DIAGNOSIS — K5903 Drug induced constipation: Secondary | ICD-10-CM | POA: Insufficient documentation

## 2019-05-05 DIAGNOSIS — R739 Hyperglycemia, unspecified: Secondary | ICD-10-CM | POA: Diagnosis not present

## 2019-05-05 DIAGNOSIS — I10 Essential (primary) hypertension: Secondary | ICD-10-CM

## 2019-05-05 DIAGNOSIS — G8929 Other chronic pain: Secondary | ICD-10-CM

## 2019-05-05 LAB — POCT URINALYSIS DIP (MANUAL ENTRY)
Bilirubin, UA: NEGATIVE
Glucose, UA: NEGATIVE mg/dL
Ketones, POC UA: NEGATIVE mg/dL
Nitrite, UA: POSITIVE — AB
Spec Grav, UA: 1.025 (ref 1.010–1.025)
Urobilinogen, UA: 0.2 E.U./dL
pH, UA: 5.5 (ref 5.0–8.0)

## 2019-05-05 MED ORDER — ALPRAZOLAM 0.25 MG PO TABS: 0.2500 mg | ORAL_TABLET | Freq: Two times a day (BID) | ORAL | 0 refills | Status: AC | PRN

## 2019-05-05 MED ORDER — TIZANIDINE HCL 4 MG PO TABS: 4.0000 mg | ORAL_TABLET | Freq: Four times a day (QID) | ORAL | 0 refills | Status: AC | PRN

## 2019-05-05 MED ORDER — NITROFURANTOIN MONOHYD MACRO 100 MG PO CAPS: 100.0000 mg | ORAL_CAPSULE | Freq: Two times a day (BID) | ORAL | 0 refills | Status: AC

## 2019-05-05 NOTE — Assessment & Plan Note (Signed)
Check labs 
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
## 2019-05-05 NOTE — Assessment & Plan Note (Signed)
Due to opioids ----  Pt had diarrhea due to taking 2 senokot which caused her to get light headed and pass out  Advised pt drink more water-- use fiber and stool softener.   Use miralax if needed  F/u with GI if no relief

## 2019-05-05 NOTE — Assessment & Plan Note (Signed)
uds and contract utd   

## 2019-05-05 NOTE — Patient Instructions (Signed)

## 2019-05-05 NOTE — Assessment & Plan Note (Signed)
Pt unable to take meds today due to diarrhea and pain F/u 3 months or sooner prn

## 2019-05-05 NOTE — Assessment & Plan Note (Signed)
Tolerating statin, encouraged heart healthy diet, avoid trans fats, minimize simple carbs and saturated fats. Increase exercise as tolerated 
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
## 2019-05-05 NOTE — Assessment & Plan Note (Signed)
Urine dip-- + uti Culture pending macrobid sent to pharmacy

## 2019-05-06 ENCOUNTER — Other Ambulatory Visit: Payer: Self-pay | Admitting: Family Medicine

## 2019-05-06 LAB — HEMOGLOBIN A1C: Hgb A1c MFr Bld: 5.8 % (ref 4.6–6.5)

## 2019-05-06 LAB — COMPREHENSIVE METABOLIC PANEL
ALT: 11 U/L (ref 0–35)
AST: 18 U/L (ref 0–37)
Albumin: 4.5 g/dL (ref 3.5–5.2)
Alkaline Phosphatase: 57 U/L (ref 39–117)
BUN: 21 mg/dL (ref 6–23)
CO2: 30 mEq/L (ref 19–32)
Calcium: 10.9 mg/dL — ABNORMAL HIGH (ref 8.4–10.5)
Chloride: 103 mEq/L (ref 96–112)
Creatinine, Ser: 0.99 mg/dL (ref 0.40–1.20)
GFR: 54.14 mL/min — ABNORMAL LOW (ref >60.00)
Glucose, Bld: 96 mg/dL (ref 70–99)
Potassium: 4.4 mEq/L (ref 3.5–5.1)
Sodium: 141 mEq/L (ref 135–145)
Total Bilirubin: 0.6 mg/dL (ref 0.2–1.2)
Total Protein: 6.8 g/dL (ref 6.0–8.3)

## 2019-05-06 LAB — LIPID PANEL
Cholesterol: 160 mg/dL (ref 0–200)
HDL: 44.9 mg/dL (ref >39.00)
LDL Cholesterol: 75 mg/dL (ref 0–99)
NonHDL: 114.97
Total CHOL/HDL Ratio: 4
Triglycerides: 199 mg/dL — ABNORMAL HIGH (ref 0.0–149.0)
VLDL: 39.8 mg/dL (ref 0.0–40.0)

## 2019-05-07 LAB — URINE CULTURE
MICRO NUMBER:: 1236850
SPECIMEN QUALITY:: ADEQUATE

## 2019-05-11 NOTE — Progress Notes (Signed)
Virtual Visit via Video Note  I connected with patient on 05/12/19 at  1:45 PM EST by audio enabled telemedicine application and verified that I am speaking with the correct person using two identifiers.   THIS ENCOUNTER IS A VIRTUAL VISIT DUE TO COVID-19 - PATIENT WAS NOT SEEN IN THE OFFICE. PATIENT HAS CONSENTED TO VIRTUAL VISIT / TELEMEDICINE VISIT   Location of patient: home  Location of provider: office  I discussed the limitations of evaluation and management by telemedicine and the availability of in person appointments. The patient expressed understanding and agreed to proceed.    Subjective:   Amanda Joyce is a 79 y.o. female who presents for an Initial Medicare Annual Wellness Visit.  Review of Systems   Home Safety/Smoke Alarms: Feels safe in home. Smoke alarms in place.  Lives with son and 2 dogs and 1 cat in 2 story home. Does well with stairs.  Female:       Mammo-  04/08/19     Dexa scan-  Pt declines at this time due to Covid 19    [facility]- Cologuard 11/26/17. Negative     Objective:     Advanced Directives 05/12/2019 05/29/2018 01/25/2015 08/25/2014 07/23/2014  Does Patient Have a Medical Advance Directive? No No No No No  Type of Academic librarian;Living will - - - -  Does patient want to make changes to medical advance directive? No - Patient declined - - - -  Copy of [place] in Chart? No - copy requested - - - -  Would patient like information on creating a medical advance directive? - - - No - patient declined information Yes - Educational materials given    Current Medications (verified) Outpatient Encounter Medications as of 05/12/2019  Medication Sig  . ALPRAZolam (XANAX) 0.25 MG tablet Take 1 tablet (0.25 mg total) by mouth 2 (two) times daily as needed for anxiety.  Marland Kitchen azelastine (ASTELIN) 0.1 % nasal spray Place 1 spray into both nostrils 2 (two) times daily. Use in each nostril as directed  . b complex  vitamins capsule Take 1 capsule by mouth daily.  . Biotin 5000 MCG CAPS Take 5,000 mcg by mouth daily.  . Cholecalciferol (VITAMIN D3) 2000 UNITS capsule Take 4,000 Units by mouth daily.  . Cranberry 500 MG CAPS Take by mouth.  . desvenlafaxine (PRISTIQ) 100 MG 24 hr tablet Take 1 tablet (100 mg total) by mouth daily.  . Diclofenac Sodium (PENNSAID) 2 % SOLN Place onto the skin. Apply to knee daily as needed  . diltiazem (CARTIA XT) 240 MG 24 hr capsule TAKE ONE CAPSULE BY MOUTH EVERY DAY  . fenofibrate 160 MG tablet Take 1 tablet (160 mg total) by mouth daily.  . fluticasone (FLONASE) 50 MCG/ACT nasal spray Place 1 spray into both nostrils 2 (two) times daily.  Marland Kitchen ibuprofen (ADVIL) 800 MG tablet TAKE ONE TABLET BY MOUTH EVERY 8 HOURS AS NEEDED  . levocetirizine (XYZAL) 5 MG tablet Take 1 tablet (5 mg total) by mouth every evening.  . lidocaine (LIDODERM) 5 % Place 1 patch onto the skin every 12 (twelve) hours. Remove & Discard patch within 12 hours or as directed by MD  . metoprolol tartrate (LOPRESSOR) 25 MG tablet TAKE 1 TABLET BY MOUTH TWICE DAILY GENERIC EQUIVALENT FOR LOPRESSOR.  . nitrofurantoin, macrocrystal-monohydrate, (MACROBID) 100 MG capsule Take 1 capsule (100 mg total) by mouth 2 (two) times daily.  . nitrofurantoin, macrocrystal-monohydrate, (MACROBID) 100 MG capsule  Take 1 capsule (100 mg total) by mouth 2 (two) times daily.  Salley Scarlet [facility]  Onychomycosis Nail Lacquer -  Fluconazole 2%, Terbinafine 1% DMSO/undecylenic acid 25% Apply to affected nail once daily Qty. 120 gm 3 refills  . omega-3 acid ethyl esters (LOVAZA) 1 g capsule Take 2 capsules twice daily by mouth  . omeprazole (PRILOSEC) 40 MG capsule Take 1 capsule (40 mg total) by mouth daily.  Marland Kitchen oxyCODONE-acetaminophen (PERCOCET/ROXICET) 5-325 MG tablet Take 1 tablet by mouth every 8 (eight) hours as needed for severe pain.  Marland Kitchen oxyCODONE-acetaminophen (ROXICET) 5-325 MG tablet Take 1 tablet by mouth  every 8 (eight) hours as needed for severe pain.  Marland Kitchen oxyCODONE-acetaminophen (ROXICET) 5-325 MG tablet Take 1 tablet by mouth every 8 (eight) hours as needed for severe pain.  . phenazopyridine (PYRIDIUM) 100 MG tablet Take 1 tablet (100 mg total) by mouth 3 (three) times daily as needed for pain (with food).  . pravastatin (PRAVACHOL) 20 MG tablet TAKE 1 TABLET BY MOUTH DAILY GENERIC EQUIVALENT FOR PRAVACHOL  . quinapril (ACCUPRIL) 40 MG tablet Take 1 tablet (40 mg total) by mouth daily. TAKE 1 TABLET BY MOUTH DAILY  . tiZANidine (ZANAFLEX) 4 MG tablet Take 1 tablet (4 mg total) by mouth every 6 (six) hours as needed for muscle spasms.  Marland Kitchen triamcinolone cream (KENALOG) 0.1 % APPLY  CREAM EXTERNALLY TO AFFECTED AREA TWICE DAILY  . UNABLE TO FIND Med Name: B100 vitamin  . zolpidem (AMBIEN) 5 MG tablet Take 1 tablet (5 mg total) by mouth at bedtime as needed. for sleep   No facility-administered encounter medications on file as of 05/12/2019.    Allergies (verified) Clarithromycin, Influenza vac split quad, Penicillins, and Soma [carisoprodol]   History: Past Medical History:  Diagnosis Date  . Anxiety   . Arthritis    neck, shoulders, back, knees   . Breast cancer ([place])    Left   . Depression   . GERD (gastroesophageal reflux disease)   . Gout   . Hiatal hernia   . History of stress test >10 yrs. ago   in [place], [place] - no need for follow up  . Hyperlipemia   . Hypertension   . Irritable bowel syndrome   . Nontoxic uninodular goiter   . Osteoarthrosis, unspecified whether generalized or localized, unspecified site   . Personal history of malignant neoplasm of breast   . Temporomandibular joint disorders, unspecified    Past Surgical History:  Procedure Laterality Date  . ABDOMINAL HYSTERECTOMY    . APPENDECTOMY    . BREAST LUMPECTOMY     Left breast X2  . EYE SURGERY Bilateral    /w iol  . HERNIA REPAIR    . THYROID SURGERY    . 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 Coronary artery disease Father   . Liver cancer Paternal Uncle   . Alcohol abuse Paternal Uncle   . Other Mother        deceased from brain tumor  . Colon cancer Neg Hx    Social History   Socioeconomic History  . Marital status: Single    Spouse name: Not on file  . Number of children: 2  . Years of education: Not on file  . Highest education level: Not on file  Occupational History  . Occupation: Retired    Comment: Financial planner: RETIRED  Tobacco Use  . Smoking status: Never Smoker  . Smokeless tobacco: Never Used  Substance and Sexual Activity  . Alcohol use: No  . Drug use: No  . Sexual activity: Not Currently  Other Topics Concern  . Not on file  Social History Narrative   2 caffeine drinks daily    Social Determinants of Health   Financial Resource Strain:   . Difficulty of Paying Living Expenses: Not on file  Food Insecurity:   . Worried About Charity fundraiser in the Last Year: Not on file  . Ran Out of Food in the Last Year: Not on file  Transportation Needs:   . Lack of Transportation (Medical): Not on file  . Lack of Transportation (Non-Medical): Not on file  Physical Activity:   . Days of Exercise per Week: Not on file  . Minutes of Exercise per Session: Not on file  Stress:   . Feeling of Stress : Not on file  Social Connections:   . Frequency of Communication with Friends and Family: Not on file  . Frequency of Social Gatherings with Friends and Family: Not on file  . Attends Religious Services: Not on file  . Active Member of Clubs or Organizations: Not on file  . Attends Archivist Meetings: Not on file  . Marital Status: Not on file    Tobacco Counseling Counseling given: Not Answered   Clinical Intake: Pain : No/denies pain      Activities of Daily Living In your present state of health, do you have any difficulty performing the following activities: 05/12/2019  Hearing? N  Vision? N  Difficulty concentrating or making decisions? N  Walking or climbing stairs? N  Dressing or bathing? N  Doing errands, shopping? N  Preparing Food and eating ? N  Using the Toilet? N  In the past six months, have you accidently leaked urine? N  Do you have problems with loss of bowel control? N  Managing your Medications? N  Managing your Finances? N  Housekeeping or managing your Housekeeping? N  Some recent data might be hidden     Immunizations and Health Maintenance Immunization History  Administered Date(s) Administered  . Pneumococcal Conjugate-13 08/08/2017  . Pneumococcal Polysaccharide-23 06/07/2008  . Zoster 07/17/2013   Health Maintenance Due  Topic Date Due  . TETANUS/TDAP  07/25/1959    Patient Care Team: Carollee Herter, Alferd Apa, DO as PCP - General Tommy Medal, Lavell Islam, MD as Referring Physician (Infectious Diseases) Elsie Stain, MD as Consulting Physician (Pulmonary Disease)  Indicate any recent Medical Services you may have received from other than [facility] providers in the past year (date may be approximate).     Assessment:   This is a routine wellness examination for [place]. Physical assessment deferred to PCP.  Hearing/Vision screen Unable to assess. This visit is enabled though telemedicine due to Covid 19.   Dietary issues and exercise activities discussed: Current Exercise Habits: The patient does not participate in regular exercise at present, Exercise limited by: None identified Diet (meal preparation, eat out, water intake, caffeinated beverages, dairy products, fruits and vegetables): well balanced, on average, 2 meals per day  Goals    . Maintain healthy active lifestyle.      Depression Screen PHQ 2/9 Scores 05/12/2019 05/06/2018 03/18/2017 03/17/2015 03/04/2013  PHQ - 2  Score 0 2 0 0 0  PHQ- 9 Score - 7 - - -    Fall Risk Fall Risk  05/12/2019 05/05/2019 01/10/2018 03/18/2017 03/17/2015  Falls in the past year? 1 1 No No No  Number falls in past yr: 0 0 - - -  Comment - - - - -  Injury with Fall? 0 0 - - -  Follow up Education provided;Falls prevention discussed Falls evaluation completed - - -    Cognitive Function: Ad8 score reviewed for issues:  Issues making decisions:no  Less interest in hobbies / activities:no  Repeats questions, stories (family complaining):no  Trouble using ordinary gadgets (microwave, computer, phone):no  Forgets the month or year: no  Mismanaging finances: no  Remembering appts:no  Daily problems with thinking and/or memory:no Ad8 score is=0         Screening Tests Health Maintenance  Topic Date Due  . TETANUS/TDAP  07/25/1959  . INFLUENZA VACCINE  08/05/2026 (Originally 12/06/2018)  . DEXA SCAN  Completed  . PNA vac Low Risk Adult  Completed     Plan:    Please schedule your next medicare wellness visit with me in 1 yr.  Continue to eat heart healthy diet (full of fruits, vegetables, whole grains, lean protein, water--limit salt, fat, and sugar intake) and increase physical activity as tolerated.  Continue doing brain stimulating activities (puzzles, reading, adult coloring books, staying active) to keep memory sharp.   Bring a copy of your living will and/or healthcare power of attorney to your next office visit.    I have personally reviewed and noted the following in the patient's chart:   . Medical and social history . Use of alcohol, tobacco or illicit drugs  . Current medications and supplements . Functional ability and status . Nutritional status . Physical activity . Advanced directives . List of other physicians . Hospitalizations, surgeries, and ER visits in previous 12 months . Vitals . Screenings to include cognitive, depression, and falls . Referrals and appointments  In  addition, I have reviewed and discussed with patient certain preventive protocols, quality metrics, and best practice recommendations. A written personalized care plan for preventive services as well as general preventive health recommendations were provided to patient.     Shela Nevin, [place]   05/12/2019

## 2019-05-12 ENCOUNTER — Ambulatory Visit (INDEPENDENT_AMBULATORY_CARE_PROVIDER_SITE_OTHER): Payer: Medicare Other | Admitting: *Deleted

## 2019-05-12 ENCOUNTER — Encounter: Payer: Self-pay | Admitting: *Deleted

## 2019-05-12 ENCOUNTER — Other Ambulatory Visit: Payer: Self-pay

## 2019-05-12 DIAGNOSIS — Z0000 Encounter for general adult medical examination without abnormal findings: Secondary | ICD-10-CM | POA: Diagnosis not present

## 2019-05-12 NOTE — Patient Instructions (Signed)
Please schedule your next medicare wellness visit with me in 1 yr.  Continue to eat heart healthy diet (full of fruits, vegetables, whole grains, lean protein, water--limit salt, fat, and sugar intake) and increase physical activity as tolerated.  Continue doing brain stimulating activities (puzzles, reading, adult coloring books, staying active) to keep memory sharp.   Bring a copy of your living will and/or healthcare power of attorney to your next office visit.   Amanda Joyce , Thank you for taking time to come for your Medicare Wellness Visit. I appreciate your ongoing commitment to your health goals. Please review the following plan we discussed and let me know if I can assist you in the future.   These are the goals we discussed: Goals    . Maintain healthy active lifestyle.       This is a list of the screening recommended for you and due dates:  Health Maintenance  Topic Date Due  . Tetanus Vaccine  07/25/1959  . Flu Shot  08/05/2026*  . DEXA scan (bone density measurement)  Completed  . Pneumonia vaccines  Completed  *Topic was postponed. The date shown is not the original due date.    Preventive Care 15 Years and Older, Female Preventive care refers to lifestyle choices and visits with your health care provider that can promote health and wellness. This includes:  A yearly physical exam. This is also called an annual well check.  Regular dental and eye exams.  Immunizations.  Screening for certain conditions.  Healthy lifestyle choices, such as diet and exercise. What can I expect for my preventive care visit? Physical exam Your health care provider will check:  Height and weight. These may be used to calculate body mass index (BMI), which is a measurement that tells if you are at a healthy weight.  Heart rate and blood pressure.  Your skin for abnormal spots. Counseling Your health care provider may ask you questions about:  Alcohol, tobacco, and drug  use.  Emotional well-being.  Home and relationship well-being.  Sexual activity.  Eating habits.  History of falls.  Memory and ability to understand (cognition).  Work and work Statistician.  Pregnancy and menstrual history. What immunizations do I need?  Influenza (flu) vaccine  This is recommended every year. Tetanus, diphtheria, and pertussis (Tdap) vaccine  You may need a Td booster every 10 years. Varicella (chickenpox) vaccine  You may need this vaccine if you have not already been vaccinated. Zoster (shingles) vaccine  You may need this after age 32. Pneumococcal conjugate (PCV13) vaccine  One dose is recommended after age 53. Pneumococcal polysaccharide (PPSV23) vaccine  One dose is recommended after age 33. Measles, mumps, and rubella (MMR) vaccine  You may need at least one dose of MMR if you were born in 1957 or later. You may also need a second dose. Meningococcal conjugate (MenACWY) vaccine  You may need this if you have certain conditions. Hepatitis A vaccine  You may need this if you have certain conditions or if you travel or work in places where you may be exposed to hepatitis A. Hepatitis B vaccine  You may need this if you have certain conditions or if you travel or work in places where you may be exposed to hepatitis B. Haemophilus influenzae type b (Hib) vaccine  You may need this if you have certain conditions. You may receive vaccines as individual doses or as more than one vaccine together in one shot (combination vaccines). Talk with your  health care provider about the risks and benefits of combination vaccines. What tests do I need? Blood tests  Lipid and cholesterol levels. These may be checked every 5 years, or more frequently depending on your overall health.  Hepatitis C test.  Hepatitis B test. Screening  Lung cancer screening. You may have this screening every year starting at age 31 if you have a 30-pack-year history of  smoking and currently smoke or have quit within the past 15 years.  Colorectal cancer screening. All adults should have this screening starting at age 41 and continuing until age 24. Your health care provider may recommend screening at age 30 if you are at increased risk. You will have tests every 1-10 years, depending on your results and the type of screening test.  Diabetes screening. This is done by checking your blood sugar (glucose) after you have not eaten for a while (fasting). You may have this done every 1-3 years.  Mammogram. This may be done every 1-2 years. Talk with your health care provider about how often you should have regular mammograms.  BRCA-related cancer screening. This may be done if you have a family history of breast, ovarian, tubal, or peritoneal cancers. Other tests  Sexually transmitted disease (STD) testing.  Bone density scan. This is done to screen for osteoporosis. You may have this done starting at age 65. Follow these instructions at home: Eating and drinking  Eat a diet that includes fresh fruits and vegetables, whole grains, lean protein, and low-fat dairy products. Limit your intake of foods with high amounts of sugar, saturated fats, and salt.  Take vitamin and mineral supplements as recommended by your health care provider.  Do not drink alcohol if your health care provider tells you not to drink.  If you drink alcohol: ? Limit how much you have to 0-1 drink a day. ? Be aware of how much alcohol is in your drink. In the [place] one drink equals one 12 oz bottle of beer (355 mL), one 5 oz glass of wine (148 mL), or one 1 oz glass of hard liquor (44 mL). Lifestyle  Take daily care of your teeth and gums.  Stay active. Exercise for at least 30 minutes on 5 or more days each week.  Do not use any products that contain nicotine or tobacco, such as cigarettes, e-cigarettes, and chewing tobacco. If you need help quitting, ask your health care  provider.  If you are sexually active, practice safe sex. Use a condom or other form of protection in order to prevent STIs (sexually transmitted infections).  Talk with your health care provider about taking a low-dose aspirin or statin. What's next?  Go to your health care provider once a year for a well check visit.  Ask your health care provider how often you should have your eyes and teeth checked.  Stay up to date on all vaccines. This information is not intended to replace advice given to you by your health care provider. Make sure you discuss any questions you have with your health care provider. Document Revised: 04/17/2018 Document Reviewed: 04/17/2018 Elsevier Patient Education  2020 [employer].

## 2019-05-28 ENCOUNTER — Ambulatory Visit: Payer: Medicare Other | Attending: Internal Medicine

## 2019-05-28 DIAGNOSIS — Z23 Encounter for immunization: Secondary | ICD-10-CM

## 2019-05-28 NOTE — Progress Notes (Signed)
   Covid-19 Vaccination Clinic  Name:  Amanda Joyce    MRN: [id number] DOB: 02-22-1941  05/28/2019  [name] was observed post Covid-19 immunization for 15 minutes without incidence. She was provided with Vaccine Information Sheet and instruction to access the V-Safe system.   [name] was instructed to call 911 with any severe reactions post vaccine: Marland Kitchen Difficulty breathing  . Swelling of your face and throat  . A fast heartbeat  . A bad rash all over your body  . Dizziness and weakness    Immunizations Administered    Name Date Dose VIS Date Route   Pfizer COVID-19 Vaccine 05/28/2019  3:58 PM 0.3 mL 04/17/2019 Intramuscular   Manufacturer: [place]   Lot: [id number]   [place]: [id number]

## 2019-06-04 ENCOUNTER — Other Ambulatory Visit: Payer: Self-pay

## 2019-06-04 ENCOUNTER — Encounter: Payer: Self-pay | Admitting: Family Medicine

## 2019-06-04 ENCOUNTER — Ambulatory Visit (INDEPENDENT_AMBULATORY_CARE_PROVIDER_SITE_OTHER): Payer: Medicare Other | Admitting: Family Medicine

## 2019-06-04 DIAGNOSIS — G4700 Insomnia, unspecified: Secondary | ICD-10-CM | POA: Diagnosis not present

## 2019-06-04 DIAGNOSIS — G894 Chronic pain syndrome: Secondary | ICD-10-CM | POA: Diagnosis not present

## 2019-06-04 DIAGNOSIS — I10 Essential (primary) hypertension: Secondary | ICD-10-CM

## 2019-06-04 DIAGNOSIS — M159 Polyosteoarthritis, unspecified: Secondary | ICD-10-CM

## 2019-06-04 DIAGNOSIS — E785 Hyperlipidemia, unspecified: Secondary | ICD-10-CM

## 2019-06-04 DIAGNOSIS — M8949 Other hypertrophic osteoarthropathy, multiple sites: Secondary | ICD-10-CM

## 2019-06-04 MED ORDER — OXYCODONE-ACETAMINOPHEN 5-325 MG PO TABS: 1.0000 | ORAL_TABLET | Freq: Three times a day (TID) | ORAL | 0 refills | Status: AC | PRN

## 2019-06-04 MED ORDER — ZOLPIDEM TARTRATE 5 MG PO TABS: 5.0000 mg | ORAL_TABLET | Freq: Every evening | ORAL | 3 refills | Status: DC | PRN

## 2019-06-04 NOTE — Progress Notes (Signed)
Patient ID: Amanda Joyce, female    DOB: 08/10/1940  Age: 79 y.o. MRN: [id number]    Subjective:  Subjective  HPI Amanda Joyce presents for f/u and med refill.  She needs to go over her labs from previous visit.    Pt would like to move to [place]---- she became very emotional when talking about it.  Her son is causing a lot of stress.  She needs a note so she can take her dog on the plane with her .     Review of Systems  Constitutional: Negative for appetite change, diaphoresis, fatigue and unexpected weight change.  Eyes: Negative for pain, redness and visual disturbance.  Respiratory: Negative for cough, chest tightness, shortness of breath and wheezing.   Cardiovascular: Negative for chest pain, palpitations and leg swelling.  Endocrine: Negative for cold intolerance, heat intolerance, polydipsia, polyphagia and polyuria.  Genitourinary: Negative for difficulty urinating, dysuria and frequency.  Neurological: Negative for dizziness, light-headedness, numbness and headaches.    History Past Medical History:  Diagnosis Date  . Anxiety   . Arthritis    neck, shoulders, back, knees   . Breast cancer ([place])    Left   . Depression   . GERD (gastroesophageal reflux disease)   . Gout   . Hiatal hernia   . History of stress test >10 yrs. ago   in [place], [place] - no need for follow up  . Hyperlipemia   . Hypertension   . Irritable bowel syndrome   . Nontoxic uninodular goiter   . Osteoarthrosis, unspecified whether generalized or localized, unspecified site   . Personal history of malignant neoplasm of breast   . Temporomandibular joint disorders, unspecified     She has a past surgical history that includes Appendectomy; Hernia repair; Abdominal hysterectomy; Thyroid surgery; Eye surgery (Bilateral); Total knee arthroplasty (Right, 08/04/2014); and Breast lumpectomy.   Her family history includes Alcohol abuse in her paternal uncle; Coronary artery disease in her father; Liver  cancer in her paternal uncle; Other in her mother.She reports that she has never smoked. She has never used smokeless tobacco. She reports that she does not drink alcohol or use drugs.  Current Outpatient Medications on File Prior to Visit  Medication Sig Dispense Refill  . ALPRAZolam (XANAX) 0.25 MG tablet Take 1 tablet (0.25 mg total) by mouth 2 (two) times daily as needed for anxiety. 30 tablet 0  . azelastine (ASTELIN) 0.1 % nasal spray Place 1 spray into both nostrils 2 (two) times daily. Use in each nostril as directed 30 mL 12  . b complex vitamins capsule Take 1 capsule by mouth daily.    . Biotin 5000 MCG CAPS Take 5,000 mcg by mouth daily.    . Cholecalciferol (VITAMIN D3) 2000 UNITS capsule Take 4,000 Units by mouth daily.    . Cranberry 500 MG CAPS Take by mouth.    . desvenlafaxine (PRISTIQ) 100 MG 24 hr tablet Take 1 tablet (100 mg total) by mouth daily. 90 tablet 0  . Diclofenac Sodium (PENNSAID) 2 % SOLN Place onto the skin. Apply to knee daily as needed    . diltiazem (CARTIA XT) 240 MG 24 hr capsule TAKE ONE CAPSULE BY MOUTH EVERY DAY 90 capsule 3  . fenofibrate 160 MG tablet Take 1 tablet (160 mg total) by mouth daily. 30 tablet 3  . fluticasone (FLONASE) 50 MCG/ACT nasal spray Place 1 spray into both nostrils 2 (two) times daily. 16 g 11  . ibuprofen (ADVIL)  800 MG tablet TAKE ONE TABLET BY MOUTH EVERY 8 HOURS AS NEEDED 90 tablet 0  . levocetirizine (XYZAL) 5 MG tablet Take 1 tablet (5 mg total) by mouth every evening. 30 tablet 5  . metoprolol tartrate (LOPRESSOR) 25 MG tablet TAKE 1 TABLET BY MOUTH TWICE DAILY GENERIC EQUIVALENT FOR LOPRESSOR. 180 tablet 3  . nitrofurantoin, macrocrystal-monohydrate, (MACROBID) 100 MG capsule Take 1 capsule (100 mg total) by mouth 2 (two) times daily. 14 capsule 0  . nitrofurantoin, macrocrystal-monohydrate, (MACROBID) 100 MG capsule Take 1 capsule (100 mg total) by mouth 2 (two) times daily. 10 capsule 0  . NON FORMULARY [facility]    Onychomycosis Nail Lacquer -  Fluconazole 2%, Terbinafine 1% DMSO/undecylenic acid 25% Apply to affected nail once daily Qty. 120 gm 3 refills    . omega-3 acid ethyl esters (LOVAZA) 1 g capsule Take 2 capsules twice daily by mouth 120 capsule 2  . omeprazole (PRILOSEC) 40 MG capsule Take 1 capsule (40 mg total) by mouth daily. 30 capsule 2  . oxyCODONE-acetaminophen (ROXICET) 5-325 MG tablet Take 1 tablet by mouth every 8 (eight) hours as needed for severe pain. 90 tablet 0  . oxyCODONE-acetaminophen (ROXICET) 5-325 MG tablet Take 1 tablet by mouth every 8 (eight) hours as needed for severe pain. 90 tablet 0  . phenazopyridine (PYRIDIUM) 100 MG tablet Take 1 tablet (100 mg total) by mouth 3 (three) times daily as needed for pain (with food). 10 tablet 0  . pravastatin (PRAVACHOL) 20 MG tablet TAKE 1 TABLET BY MOUTH DAILY GENERIC EQUIVALENT FOR PRAVACHOL 90 tablet 3  . quinapril (ACCUPRIL) 40 MG tablet Take 1 tablet (40 mg total) by mouth daily. TAKE 1 TABLET BY MOUTH DAILY 90 tablet 3  . tiZANidine (ZANAFLEX) 4 MG tablet Take 1 tablet (4 mg total) by mouth every 6 (six) hours as needed for muscle spasms. 30 tablet 0  . triamcinolone cream (KENALOG) 0.1 % APPLY  CREAM EXTERNALLY TO AFFECTED AREA TWICE DAILY 30 g 1  . UNABLE TO FIND Med Name: B100 vitamin     No current facility-administered medications on file prior to visit.     Objective:  Objective  Physical Exam Vitals and nursing note reviewed.  Constitutional:      Appearance: She is well-developed.  HENT:     Head: Normocephalic and atraumatic.  Eyes:     Conjunctiva/sclera: Conjunctivae normal.  Neck:     Thyroid: No thyromegaly.     Vascular: No carotid bruit or JVD.  Cardiovascular:     Rate and Rhythm: Normal rate and regular rhythm.     Heart sounds: Normal heart sounds. No murmur.  Pulmonary:     Effort: Pulmonary effort is normal. No respiratory distress.     Breath sounds: Normal breath sounds. No wheezing or  rales.  Chest:     Chest wall: No tenderness.  Musculoskeletal:     Cervical back: Normal range of motion and neck supple.  Neurological:     Mental Status: She is alert and oriented to person, place, and time.    BP 100/78 (BP Location: Right Arm, Patient Position: Sitting, Cuff Size: Normal)   Pulse 73   Temp (!) 97.1 F (36.2 C) (Temporal)   Resp 18   Ht 5\' 8"  (1.727 m)   Wt 159 lb 12.8 oz (72.5 kg)   SpO2 98%   BMI 24.30 kg/m  Wt Readings from Last 3 Encounters:  06/04/19 159 lb 12.8 oz (72.5 kg)  05/05/19  157 lb 12.8 oz (71.6 kg)  02/24/19 159 lb 6.4 oz (72.3 kg)     Lab Results  Component Value Date   WBC 10.4 05/29/2018   HGB 15.3 (H) 05/29/2018   HCT 46.0 05/29/2018   PLT 332 05/29/2018   GLUCOSE 96 05/05/2019   CHOL 160 05/05/2019   TRIG 199.0 (H) 05/05/2019   HDL 44.90 05/05/2019   LDLDIRECT 108.0 11/13/2018   LDLCALC 75 05/05/2019   ALT 11 05/05/2019   AST 18 05/05/2019   NA 141 05/05/2019   K 4.4 05/05/2019   CL 103 05/05/2019   CREATININE 0.99 05/05/2019   BUN 21 05/05/2019   CO2 30 05/05/2019   TSH 1.77 02/07/2017   INR 1.04 07/23/2014   HGBA1C 5.8 05/05/2019    MM 3D SCREEN BREAST BILATERAL  Result Date: 04/08/2019 CLINICAL DATA:  Screening. EXAM: DIGITAL SCREENING BILATERAL MAMMOGRAM WITH TOMO AND CAD COMPARISON:  Previous exam(s). ACR Breast Density Category b: There are scattered areas of fibroglandular density. FINDINGS: There are no findings suspicious for malignancy. Images were processed with CAD. IMPRESSION: No mammographic evidence of malignancy. A result letter of this screening mammogram will be mailed directly to the patient. RECOMMENDATION: Screening mammogram in one year. (Code:SM-B-01Y) BI-RADS CATEGORY  1: Negative. Electronically Signed   By: Marin Olp M.D.   On: 04/08/2019 13:45     Assessment & Plan:  Plan  I am having Amanda Joyce maintain her Vitamin D3, b complex vitamins, Biotin, Diclofenac Sodium, levocetirizine,  azelastine, omega-3 acid ethyl esters, Cranberry, UNABLE TO FIND, triamcinolone cream, fenofibrate, fluticasone, phenazopyridine, NON FORMULARY, omeprazole, desvenlafaxine, quinapril, pravastatin, metoprolol tartrate, diltiazem, ibuprofen, nitrofurantoin (macrocrystal-monohydrate), oxyCODONE-acetaminophen, oxyCODONE-acetaminophen, tiZANidine, ALPRAZolam, nitrofurantoin (macrocrystal-monohydrate), oxyCODONE-acetaminophen, and zolpidem.  Meds ordered this encounter  Medications  . oxyCODONE-acetaminophen (PERCOCET/ROXICET) 5-325 MG tablet    Sig: Take 1 tablet by mouth every 8 (eight) hours as needed for severe pain.    Dispense:  90 tablet    Refill:  0  . zolpidem (AMBIEN) 5 MG tablet    Sig: Take 1 tablet (5 mg total) by mouth at bedtime as needed. for sleep    Dispense:  30 tablet    Refill:  3    teva brand    Problem List Items Addressed This Visit      Unprioritized   Chronic pain syndrome    Contract and uds utd Database reviewed       Relevant Medications   oxyCODONE-acetaminophen (PERCOCET/ROXICET) 5-325 MG tablet   Essential hypertension    Well controlled, no changes to meds. Encouraged heart healthy diet such as the DASH diet and exercise as tolerated.       Hyperlipidemia LDL goal <100    Encouraged heart healthy diet, increase exercise, avoid trans fats, consider a krill oil cap daily      Insomnia   Relevant Medications   zolpidem (AMBIEN) 5 MG tablet   Osteoarthritis of multiple joints    Refill meds       Relevant Medications   oxyCODONE-acetaminophen (PERCOCET/ROXICET) 5-325 MG tablet    Other Visit Diagnoses    Hypercalcemia                   Check pth today   Questions answered about covid vaccine  Note written for emotional support dog   Follow-up: Return in about 3 months (around 09/02/2019), or if symptoms worsen or fail to improve.  Ann Held, DO

## 2019-06-04 NOTE — Patient Instructions (Signed)

## 2019-06-05 LAB — PTH, INTACT AND CALCIUM
Calcium: 10.4 mg/dL (ref 8.6–10.4)
PTH: 35 pg/mL (ref 14–64)

## 2019-06-05 NOTE — Assessment & Plan Note (Signed)
Encouraged heart healthy diet, increase exercise, avoid trans fats, consider a krill oil cap daily 

## 2019-06-05 NOTE — Assessment & Plan Note (Signed)
Well controlled, no changes to meds. Encouraged heart healthy diet such as the DASH diet and exercise as tolerated.  °

## 2019-06-05 NOTE — Assessment & Plan Note (Signed)
Refill meds

## 2019-06-05 NOTE — Assessment & Plan Note (Signed)
Contract and uds utd Database reviewed

## 2019-06-06 IMAGING — MR MR HEAD W/O CM
10 series · 48 of 48 positions shown · non-contrast
Comparison: 08/17/2009

CLINICAL DATA: Headaches and blurred vision over the last 6 months.

EXAM:
MRI HEAD WITHOUT CONTRAST
TECHNIQUE: Multiplanar, multiecho pulse sequences of the brain and surrounding
structures were obtained without intravenous contrast.

[Series 2: T1 · sagittal · 5.0mm · 0.45mm/px · 2 of 24 slices shown]
[im 1/24]
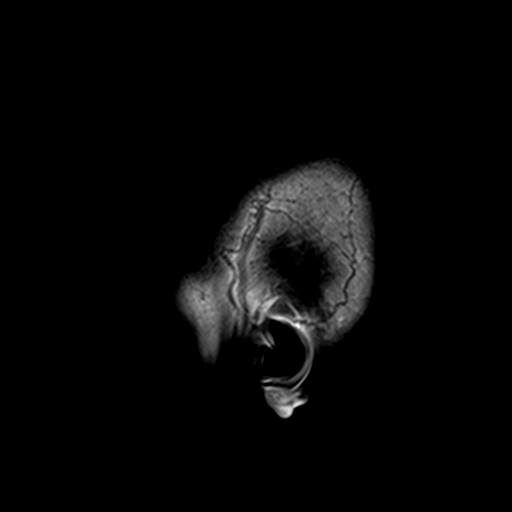
[im 24/24]
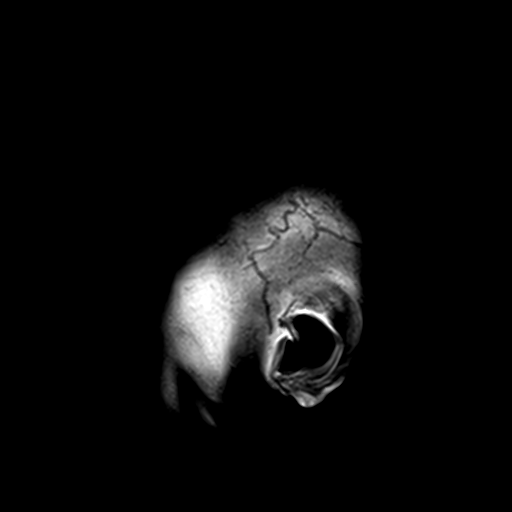

[Series 3: DWI · axial · 3.0mm · 1.80mm/px · z∈[-40,+116]mm · 9 of 106 slices shown (1 of 4)]
[im 1/106]
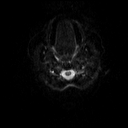
[im 14/106]
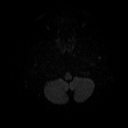
[im 27/106]
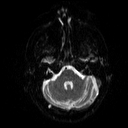
[im 40/106]
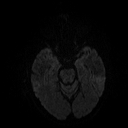
[im 53/106]
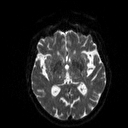
[im 66/106]
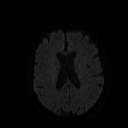
[im 79/106]
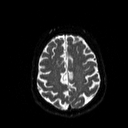
[im 92/106]
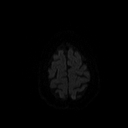
[im 106/106]
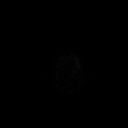

[Series 4: DWI · axial · 3.0mm · 1.80mm/px · z∈[-40,+116]mm · 5 of 52 slices shown (2 of 4)]
[im 1/52]
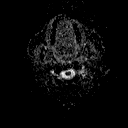
[im 13/52]
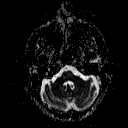
[im 26/52]
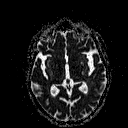
[im 39/52]
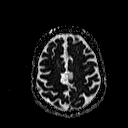
[im 52/52]
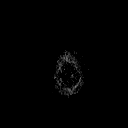

[Series 5: DWI · coronal · 5.0mm · 1.80mm/px · 6 of 72 slices shown (3 of 4)]
[im 1/72]
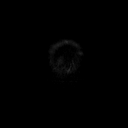
[im 15/72]
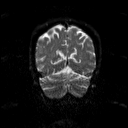
[im 29/72]
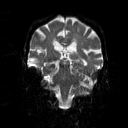
[im 43/72]
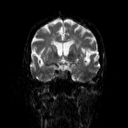
[im 57/72]
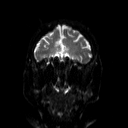
[im 72/72]
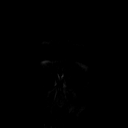

[Series 6: DWI · coronal · 5.0mm · 1.80mm/px · 3 of 36 slices shown (4 of 4)]
[im 1/36]
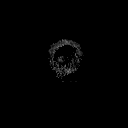
[im 18/36]
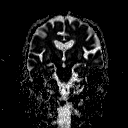
[im 36/36]
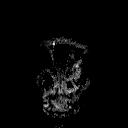

[Series 7: T2 · axial · 5.0mm · 0.51mm/px · z∈[-43,+119]mm · 2 of 25 slices shown (1 of 2)]
[im 1/25]
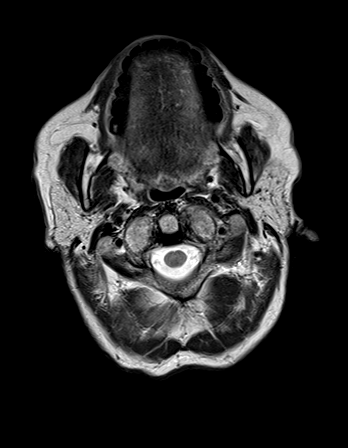
[im 25/25]
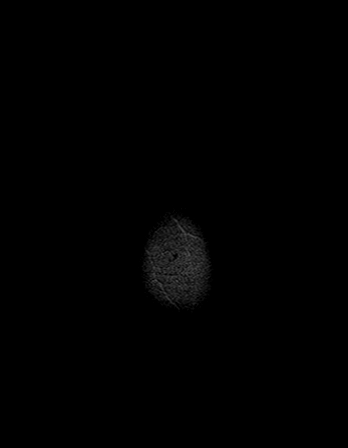

[Series 8: FLAIR · axial · 3.0mm · 0.45mm/px · z∈[-39,+110]mm · 3 of 33 slices shown]
[im 1/33]
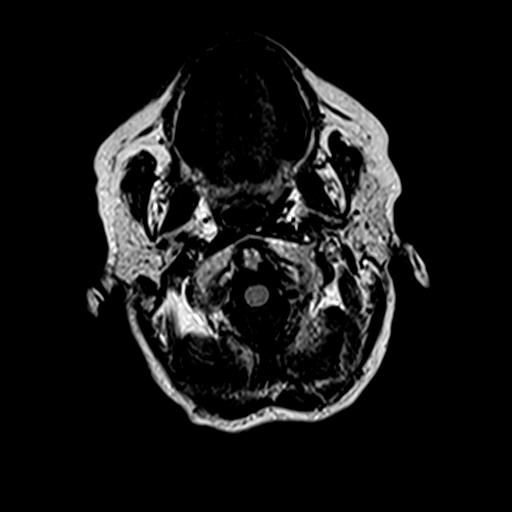
[im 17/33]
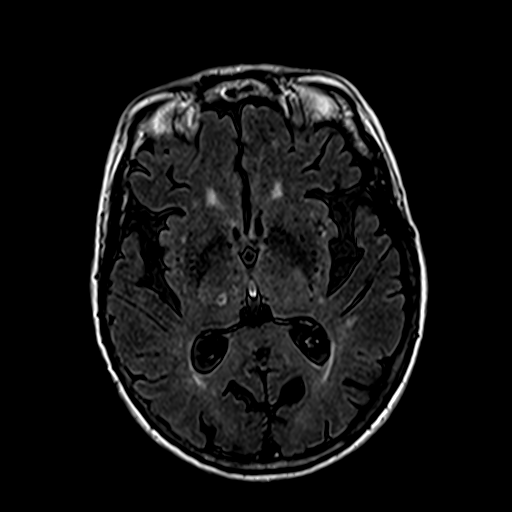
[im 33/33]
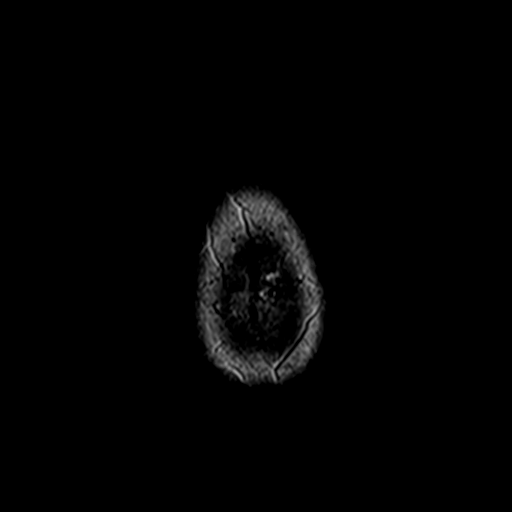

[Series 10: swi_images · axial · 5.0mm · 0.90mm/px · z∈[-34,+111]mm · 3 of 30 slices shown]
[im 1/30]
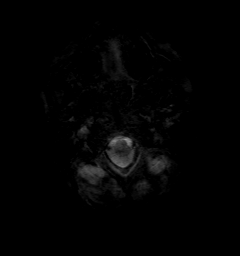
[im 15/30]
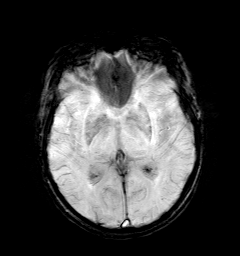
[im 30/30]
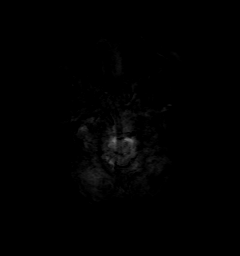

[Series 11: t1_mpr_tra · axial · 1.0mm · 0.75mm/px · z∈[-32,+111]mm · 12 of 144 slices shown]
[im 1/144]
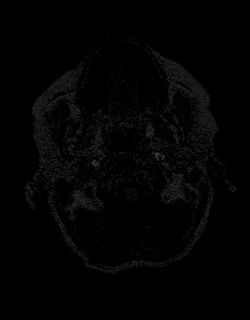
[im 14/144]
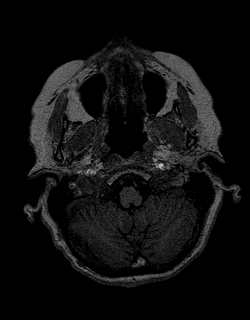
[im 27/144]
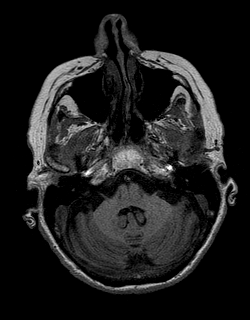
[im 40/144]
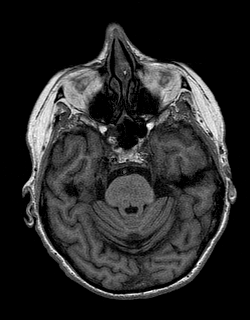
[im 53/144]
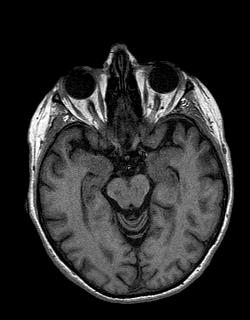
[im 66/144]
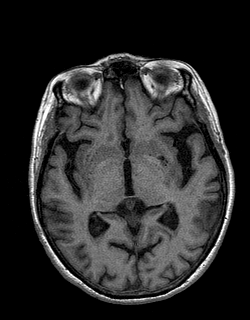
[im 79/144]
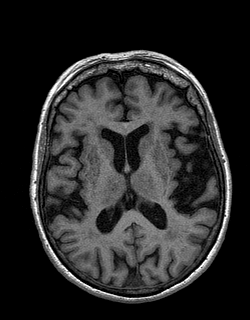
[im 92/144]
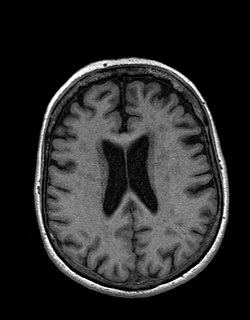
[im 105/144]
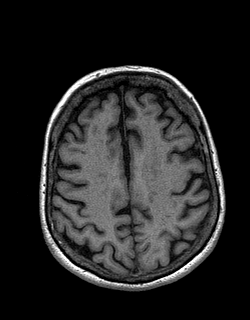
[im 118/144]
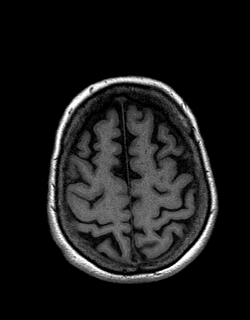
[im 131/144]
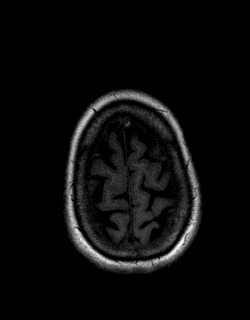
[im 144/144]
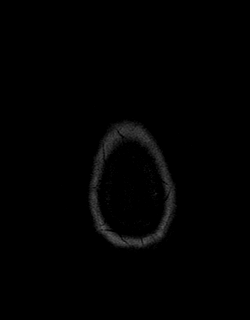

[Series 12: T2 · coronal · 5.0mm · 0.45mm/px · 3 of 31 slices shown (2 of 2)]
[im 1/31]
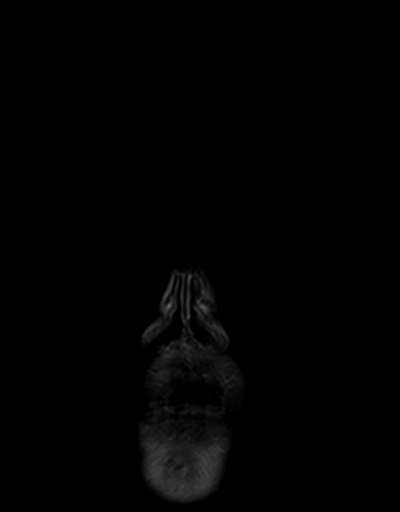
[im 16/31]
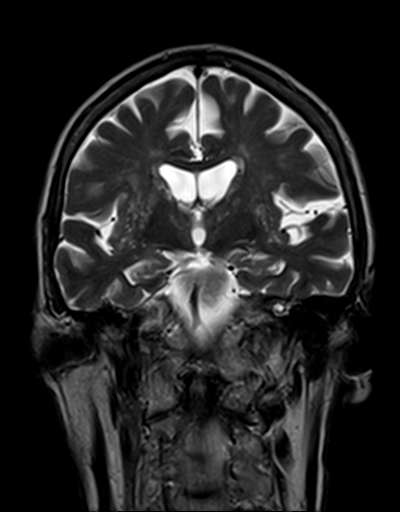
[im 31/31]
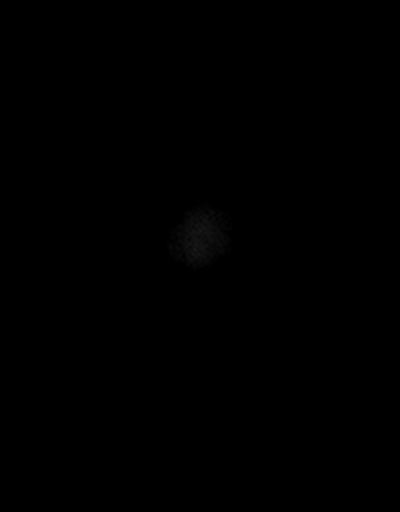

[48 of 48 positions shown; findings below may reference images not displayed]

FINDINGS: Brain: Diffusion imaging does not show any acute or subacute
infarction. There are extensive chronic small-vessel ischemic
changes affecting the pons, thalami and cerebral hemispheric deep
and subcortical white matter, considerably progressive since 0855.
No cortical or large vessel territory infarction. No mass lesion,
hemorrhage, hydrocephalus or extra-axial collection.

Vascular: Major vessels at the base of the brain show flow.

Skull and upper cervical spine: Negative

Sinuses/Orbits: Minimal mucosal inflammation of the sinuses, not
likely significant. Orbits negative.

Other: None
IMPRESSION: No cause of headache identified. No specific cause of blurred
vision. The patient does have chronic small-vessel ischemic changes
affecting the pons, thalami and cerebral hemispheric white matter,
rather considerably progressive since 0855. No stroke appears acute
or subacute.

## 2019-06-18 ENCOUNTER — Ambulatory Visit: Payer: Medicare Other | Attending: Internal Medicine

## 2019-06-18 DIAGNOSIS — Z23 Encounter for immunization: Secondary | ICD-10-CM

## 2019-06-18 NOTE — Progress Notes (Signed)
   Covid-19 Vaccination Clinic  Name:  Amanda Joyce    MRN: [id number] DOB: 1941-03-09  06/18/2019  Amanda Joyce was observed post Covid-19 immunization for 15 minutes without incidence. She was provided with Vaccine Information Sheet and instruction to access the V-Safe system.   Amanda Joyce was instructed to call 911 with any severe reactions post vaccine: Marland Kitchen Difficulty breathing  . Swelling of your face and throat  . A fast heartbeat  . A bad rash all over your body  . Dizziness and weakness    Immunizations Administered    Name Date Dose VIS Date Route   Pfizer COVID-19 Vaccine 06/18/2019  5:05 PM 0.3 mL 04/17/2019 Intramuscular   Manufacturer: [place]   Lot: [id number]   [place]: [id number]

## 2019-07-07 ENCOUNTER — Other Ambulatory Visit: Payer: Self-pay | Admitting: Family Medicine

## 2019-07-07 DIAGNOSIS — G894 Chronic pain syndrome: Secondary | ICD-10-CM

## 2019-07-07 MED ORDER — OXYCODONE-ACETAMINOPHEN 5-325 MG PO TABS: 1.0000 | ORAL_TABLET | Freq: Three times a day (TID) | ORAL | 0 refills | Status: DC | PRN

## 2019-07-07 NOTE — Telephone Encounter (Signed)
**   Please see phone note. Rx set to print. Pharmacy preference is not in the system**  .Requesting: Percocet Contract: 12/09/2018 UDS: 11/17/2018, low risk Last OV: 06/04/19 Next OV: N/A Last Refill: 04/09/2019, #90--0 RF Database:   Please advise

## 2019-07-07 NOTE — Telephone Encounter (Signed)
Patient is staying with daughter in [place]. Patient is in need of her :   oxyCODONE-acetaminophen (PERCOCET/ROXICET) 5-325 MG tablet [id number]  Medication: oxyCODONE-acetaminophen (PERCOCET/ROXICET) 5-325 MG tablet [id number]   Has the patient contacted their pharmacy? Yes.   (If no, request that the patient contact the pharmacy for the refill.) (If yes, when and what did the pharmacy advise?)  Preferred Pharmacy (with phone number or street name):   [facility]  [phone] Open  Closes 11 PM   Agent: Please be advised that RX refills may take up to 3 business days. We ask that you follow-up with your pharmacy.  Patient  Call  Back # [phone]

## 2019-07-10 ENCOUNTER — Other Ambulatory Visit: Payer: Self-pay

## 2019-07-10 DIAGNOSIS — G894 Chronic pain syndrome: Secondary | ICD-10-CM

## 2019-07-10 MED ORDER — OXYCODONE-ACETAMINOPHEN 5-325 MG PO TABS: 1.0000 | ORAL_TABLET | Freq: Three times a day (TID) | ORAL | 0 refills | Status: DC | PRN

## 2019-07-10 NOTE — Telephone Encounter (Signed)
**  This was one that was printed that needed to be resent**

## 2019-08-24 ENCOUNTER — Telehealth: Payer: Self-pay | Admitting: Family Medicine

## 2019-08-24 NOTE — Telephone Encounter (Signed)
Noted  

## 2019-08-24 NOTE — Telephone Encounter (Signed)
Pt's son dropped off document to be filled out by provider ([place]- 1 page) Pt would like to be called when document ready to pick up at [facility] [phone]. Document put at front office tray under providers name.

## 2019-08-26 ENCOUNTER — Other Ambulatory Visit: Payer: Self-pay

## 2019-08-26 DIAGNOSIS — G894 Chronic pain syndrome: Secondary | ICD-10-CM

## 2019-08-26 NOTE — Telephone Encounter (Signed)
Pa=tient called in to see if Dr. Etter Sjogren could send in a prescription for oxyCODONE-acetaminophen (PERCOCET/ROXICET) 5-325 MG tablet [id number]    Please send it to [facility] :[address], [place] [address]  Phone Number: [phone]

## 2019-08-26 NOTE — Telephone Encounter (Signed)
Requesting: Roxicet Contract: 12/09/2018 UDS: 11/17/2018, low risk Last OV: 06/04/19 Next OV: N/A Last Refill: 07/10/19, #90--0 RF Database:   Please advise

## 2019-08-27 MED ORDER — OXYCODONE-ACETAMINOPHEN 5-325 MG PO TABS: 1.0000 | ORAL_TABLET | Freq: Three times a day (TID) | ORAL | 0 refills | Status: AC | PRN

## 2019-08-31 ENCOUNTER — Other Ambulatory Visit: Payer: Self-pay | Admitting: Family Medicine

## 2019-08-31 ENCOUNTER — Telehealth: Payer: Self-pay | Admitting: Family Medicine

## 2019-08-31 DIAGNOSIS — G4700 Insomnia, unspecified: Secondary | ICD-10-CM

## 2019-08-31 MED ORDER — ZOLPIDEM TARTRATE 5 MG PO TABS: 5.0000 mg | ORAL_TABLET | Freq: Every evening | ORAL | 3 refills | Status: AC | PRN

## 2019-08-31 NOTE — Telephone Encounter (Signed)
Medication: zolpidem (AMBIEN) 5 MG tablet [id number]   Has the patient contacted their pharmacy? No. (If no, request that the patient contact the pharmacy for the refill.) (If yes, when and what did the pharmacy advise?)  Preferred Pharmacy (with phone number or street name):  [facility] [id number] - [place], [place] Phone:  [phone]  Fax:  [phone]       Agent: Please be advised that RX refills may take up to 3 business days. We ask that you follow-up with your pharmacy.

## 2019-08-31 NOTE — Telephone Encounter (Signed)
Last Zolpidem RX: 06/04/19, #30 x 3 refills Last OV:  06/04/19 Next OV: none scheduled UDS:  12/09/18 CSC: 11/17/18

## 2019-08-31 NOTE — Telephone Encounter (Signed)
done
